# Patient Record
Sex: Female | Born: 1985
Health system: Southern US, Community
[De-identification: ages and names within clinical notes are randomized; demographics above are authoritative.]

## PROBLEM LIST (undated history)

## (undated) ENCOUNTER — Inpatient Hospital Stay: Payer: Self-pay

## (undated) DIAGNOSIS — F329 Major depressive disorder, single episode, unspecified: Secondary | ICD-10-CM

## (undated) DIAGNOSIS — F32A Depression, unspecified: Secondary | ICD-10-CM

## (undated) DIAGNOSIS — J45909 Unspecified asthma, uncomplicated: Secondary | ICD-10-CM

## (undated) DIAGNOSIS — F419 Anxiety disorder, unspecified: Secondary | ICD-10-CM

## (undated) DIAGNOSIS — F325 Major depressive disorder, single episode, in full remission: Secondary | ICD-10-CM

## (undated) DIAGNOSIS — E079 Disorder of thyroid, unspecified: Secondary | ICD-10-CM

## (undated) DIAGNOSIS — L709 Acne, unspecified: Secondary | ICD-10-CM

## (undated) HISTORY — DX: Acne, unspecified: L70.9

## (undated) HISTORY — PX: TOOTH EXTRACTION: SUR596

## (undated) HISTORY — DX: Disorder of thyroid, unspecified: E07.9

## (undated) HISTORY — PX: NO PAST SURGERIES: SHX2092

## (undated) HISTORY — DX: Anxiety disorder, unspecified: F41.9

---
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
## 2015-10-02 DIAGNOSIS — F32A Depression, unspecified: Secondary | ICD-10-CM | POA: Insufficient documentation

## 2015-10-02 DIAGNOSIS — F17200 Nicotine dependence, unspecified, uncomplicated: Secondary | ICD-10-CM | POA: Insufficient documentation

## 2015-10-02 DIAGNOSIS — B977 Papillomavirus as the cause of diseases classified elsewhere: Secondary | ICD-10-CM | POA: Insufficient documentation

## 2015-10-02 DIAGNOSIS — F329 Major depressive disorder, single episode, unspecified: Secondary | ICD-10-CM | POA: Insufficient documentation

## 2017-06-23 ENCOUNTER — Emergency Department
Admission: EM | Admit: 2017-06-23 | Discharge: 2017-06-23 | Disposition: A | Discharge status: Home / Self Care | Payer: Managed Care, Other (non HMO) | Attending: Emergency Medicine | Admitting: Emergency Medicine

## 2017-06-23 ENCOUNTER — Encounter: Payer: Self-pay | Admitting: *Deleted

## 2017-06-23 ENCOUNTER — Emergency Department: Payer: Managed Care, Other (non HMO)

## 2017-06-23 DIAGNOSIS — Z79899 Other long term (current) drug therapy: Secondary | ICD-10-CM | POA: Diagnosis not present

## 2017-06-23 DIAGNOSIS — J181 Lobar pneumonia, unspecified organism: Secondary | ICD-10-CM | POA: Insufficient documentation

## 2017-06-23 DIAGNOSIS — R0602 Shortness of breath: Secondary | ICD-10-CM

## 2017-06-23 DIAGNOSIS — J189 Pneumonia, unspecified organism: Secondary | ICD-10-CM

## 2017-06-23 DIAGNOSIS — F17210 Nicotine dependence, cigarettes, uncomplicated: Secondary | ICD-10-CM | POA: Diagnosis not present

## 2017-06-23 DIAGNOSIS — J45909 Unspecified asthma, uncomplicated: Secondary | ICD-10-CM | POA: Diagnosis not present

## 2017-06-23 HISTORY — DX: Unspecified asthma, uncomplicated: J45.909

## 2017-06-23 HISTORY — DX: Depression, unspecified: F32.A

## 2017-06-23 HISTORY — DX: Major depressive disorder, single episode, unspecified: F32.9

## 2017-06-23 LAB — CBC
HCT: 36.1 % (ref 35.0–47.0)
Hemoglobin: 12.5 g/dL (ref 12.0–16.0)
MCH: 29.2 pg (ref 26.0–34.0)
MCHC: 34.6 g/dL (ref 32.0–36.0)
MCV: 84.5 fL (ref 80.0–100.0)
PLATELETS: 354 10*3/uL (ref 150–440)
RBC: 4.27 MIL/uL (ref 3.80–5.20)
RDW: 13.5 % (ref 11.5–14.5)
WBC: 11 10*3/uL (ref 3.6–11.0)

## 2017-06-23 LAB — BASIC METABOLIC PANEL
Anion gap: 9 (ref 5–15)
BUN: 8 mg/dL (ref 6–20)
CALCIUM: 10.6 mg/dL — AB (ref 8.9–10.3)
CO2: 26 mmol/L (ref 22–32)
CREATININE: 1.04 mg/dL — AB (ref 0.44–1.00)
Chloride: 107 mmol/L (ref 101–111)
GFR calc Af Amer: >60 mL/min (ref >60)
Glucose, Bld: 163 mg/dL — ABNORMAL HIGH (ref 65–99)
Potassium: 3.4 mmol/L — ABNORMAL LOW (ref 3.5–5.1)
SODIUM: 142 mmol/L (ref 135–145)

## 2017-06-23 LAB — TROPONIN I

## 2017-06-23 MED ORDER — LORAZEPAM 1 MG PO TABS
1.0000 mg | ORAL_TABLET | Freq: Once | ORAL | Status: AC
  Administered 2017-06-23: 1 mg via ORAL
  Filled 2017-06-23: qty 1

## 2017-06-23 MED ORDER — ALBUTEROL SULFATE (2.5 MG/3ML) 0.083% IN NEBU
5.0000 mg | INHALATION_SOLUTION | Freq: Once | RESPIRATORY_TRACT | Status: AC
  Administered 2017-06-23: 5 mg via RESPIRATORY_TRACT
  Filled 2017-06-23: qty 6

## 2017-06-23 MED ORDER — IPRATROPIUM-ALBUTEROL 0.5-2.5 (3) MG/3ML IN SOLN
RESPIRATORY_TRACT | Status: AC
  Filled 2017-06-23: qty 3

## 2017-06-23 MED ORDER — AZITHROMYCIN 250 MG PO TABS: ORAL_TABLET | ORAL | 0 refills | Status: AC

## 2017-06-23 NOTE — ED Provider Notes (Signed)
[facility] Emergency Department Provider Note  Time seen: 7:10 AM  I have reviewed the triage vital signs and the nursing notes.   HISTORY  Chief Complaint Shortness of Breath    HPI Jean Foster is a 31 y.o. female With a past medical history of asthma, anxiety, depression, presents to the emergency department for difficulty breathing. According to the patient and nurse report the patient was using marijuana last night when she began feeling short of breath which she states was between 1-2:00 in the morning.patient states she continued to have shortness of breath throughout the night unable to go to sleep she began having some tightness sensation in her left arm as well which prompted the emergency department visit. Patient states a history of asthma, does not currently have an inhaler. She also states a history of anxiety. Denies any "chest pain." Denies any cough, congestion, fever. Patient currently sitting upright in bed but denies any trouble breathing but continues to feel a sensation of tightness in her chest.  Past Medical History:  Diagnosis Date  . Asthma   . Depression     There are no active problems to display for this patient.   History reviewed. No pertinent surgical history.  Prior to Admission medications   Medication Sig Start Date End Date Taking? Authorizing Provider  buPROPion (WELLBUTRIN XL) 300 MG 24 hr tablet Take 300 mg by mouth daily.   Yes [provider]    No Known Allergies  History reviewed. No pertinent family history.  Social History Social History  Substance Use Topics  . Smoking status: Current Some Day Smoker    Types: Cigarettes  . Smokeless tobacco: Never Used  . Alcohol use Yes    Review of Systems Constitutional: Negative for fever. Cardiovascular: positive for chest tightness Respiratory: positive for shortness of breath Gastrointestinal: Negative for abdominal pain Musculoskeletal:  Negative for back pain. Neurological: Negative for headache All other ROS negative  ____________________________________________   PHYSICAL EXAM:  VITAL SIGNS: ED Triage Vitals  Enc Vitals Group     BP 06/23/17 0641 129/88     Pulse Rate 06/23/17 0641 (!) 101     Resp 06/23/17 0641 (!) 22     Temp 06/23/17 0641 97.9 F (36.6 C)     Temp Source 06/23/17 0641 Oral     SpO2 06/23/17 0641 100 %     Weight 06/23/17 0641 155 lb (70.3 kg)     Height 06/23/17 0641 5\' 9"  (1.753 m)     Head Circumference --      Peak Flow --      Pain Score 06/23/17 0639 3     Pain Loc --      Pain Edu? --      Excl. in [place]? --     Constitutional: Alert and oriented. sitting upright in bed, taking deep breaths, mildly anxious appearing. Able to speak in complete sentences without difficulty however. Follows all commands. Eyes: Normal exam ENT   Head: Normocephalic and atraumatic.   Mouth/Throat: Mucous membranes are moist. Cardiovascular: Normal rate, regular rhythm. No murmur Respiratory: Normal respiratory effort without tachypnea nor retractions. Breath sounds are clear, with good air movement bilaterally. Gastrointestinal: Soft and nontender. No distention.   Musculoskeletal: Nontender with normal range of motion in all extremities.  no lower extremity edema  Neurologic:  Normal speech and language. No gross focal neurologic deficits  Skin:  Skin is warm, dry and intact.  Psychiatric: Mood and affect are normal.  ____________________________________________    EKG  EKG reviewed and interpreted by myself shows normal sinus rhythm at 97 bpm with a narrow QRS, normal axis, normal intervals, no concerning ST changes.  ____________________________________________    RADIOLOGY  IMPRESSION: Left basilar ill-defined round opacity. This may represent round pneumonia. Underlying nodule is not excluded. Followup PA and lateral chest X-ray is recommended in 3-4 weeks following trial  of antibiotic therapy to ensure resolution and exclude underlying malignancy.  ____________________________________________   INITIAL IMPRESSION / ASSESSMENT AND PLAN / ED COURSE  Pertinent labs & imaging results that were available during my care of the patient were reviewed by me and considered in my medical decision making (see chart for details).  patient presents to the emergency department for shortness of breath and tightness in her left arm. Differential this time would include ACS, asthma exacerbation, anxiety, other intrathoracic abnormality such as pneumothorax, pneumonia. We'll obtain a chest x-ray, check basic labs including a cardiac enzyme we will also treat with 1 mg of oral Ativan while awaiting results given the patient's history of anxiety. Overall patient appears extremely well, no distress, but somewhat anxious appearing. She has great air movement bilaterally without wheeze although the patient is already received a breathing treatment prior to my exam.  x-ray shows a round opacity possibly representing pneumonia. Given the patient's shortness of breath we will treat with antibiotics and have the patient follow-up with her doctor in 3 or 4 weeks for repeat x-ray to rule out other causes such as malignancy. I discussed this with the patient, she is agreeable to this plan.  patient's labs otherwise normal, normal white blood cell count. Troponin is negative. We will place the patient on Zithromax with PCP follow-up. Discussed return precautions we will also refill an albuterol inhaler for the patient.  ____________________________________________   FINAL CLINICAL IMPRESSION(S) / ED DIAGNOSES  shortness of breath pneumonia   Harvest Dark, MD 06/23/17 [phone]

## 2017-06-23 NOTE — Discharge Instructions (Signed)
as we have discussed please take your antibiotics as prescribed. Please follow-up with a primary care doctor within the next 3-4 weeks for recheck/reevaluation as well as to have a repeat chest x-ray performed to ensure that the pneumonia has resolved and to rule out any underlying masses.

## 2017-06-23 NOTE — ED Triage Notes (Signed)
Pt c/o shortness of breath since 0200 today. Pt admits to marijuana use at the time of onset. Pt admits to ETOH use last night. Pt has dry, non-productive cough at this time. Pt is pale and while is able to take deep breaths, states she feels she is still short of breath. Pt has small amount of wheezing in LLL. Pt c/o L arm tingling and L shoulderblade pain.

## 2017-09-16 NOTE — L&D Delivery Note (Signed)
Delivery Summary for [name]  Labor Events:   Preterm labor:   Rupture date:   Rupture time:   Rupture type: Intact  Fluid Color:   Induction:   Augmentation:   Complications:   Cervical ripening:          Delivery:   Episiotomy:   Lacerations:   Repair suture:   Repair # of packets:   Blood loss (ml): 50   Information for the patient's newborn:  Ardell, Aaronson [[id number]]    Delivery 06/29/2018 1:18 AM by  Vaginal, Spontaneous Sex:  female Gestational Age: [redacted]w[redacted]d Delivery Clinician:   Living?:         APGARS  One minute Five minutes Ten minutes  Skin color:        Heart rate:        Grimace:        Muscle tone:        Breathing:        Totals: 8  9      Presentation/position:      Resuscitation:   Cord information:    Disposition of cord blood:     Blood gases sent?  Complications:   Placenta: Delivered:       appearance Newborn Measurements: Weight: 6 lb 2.4 oz (2790 g)  Height: 19.49"  Head circumference:    Chest circumference:    Other providers:    Additional  information: Forceps:   Vacuum:   Breech:   Observed anomalies       Delivery Note At 1:18 AM a viable and healthy female was delivered after precipitous labor via  (Presentation: vertex; LOA position).  APGAR: 8, 9; weight 2790 grams.   Placenta status: spontaneously removed, intact.  Cord: 3-vessel, with the following complications: body cord x 1, reduced after delivery of fetus.  Cord pH: not obtained.   Anesthesia: None Episiotomy:  None Lacerations:  1st degree left vaginal, hemostatic Suture Repair: None Est. Blood Loss (mL):  50  Mom to postpartum.  Baby to Couplet care / Skin to Skin.  Rubie Maid 06/29/2018, 1:33 AM

## 2017-12-02 ENCOUNTER — Ambulatory Visit (INDEPENDENT_AMBULATORY_CARE_PROVIDER_SITE_OTHER): Payer: Managed Care, Other (non HMO) | Admitting: Obstetrics and Gynecology

## 2017-12-02 ENCOUNTER — Encounter: Payer: Self-pay | Admitting: Obstetrics and Gynecology

## 2017-12-02 VITALS — BP 118/71 | HR 101 | Ht 69.0 in | Wt 167.9 lb

## 2017-12-02 DIAGNOSIS — N912 Amenorrhea, unspecified: Secondary | ICD-10-CM

## 2017-12-02 LAB — POCT URINE PREGNANCY: PREG TEST UR: POSITIVE — AB

## 2017-12-02 NOTE — Progress Notes (Signed)
HPI:      [name] is a 32 y.o. G2P1001 who LMP was Patient's last menstrual period was 10/14/2017 (exact date).  Subjective:   She presents today with complaint of a missed menstrual.  She believes she is approximately [redacted] weeks pregnant.  She complains of some left-sided pelvic pain and has concerns regarding ectopic pregnancy.  This is despite the fact that she has no increased risk for ectopic pregnancy. She is using sea bands for nausea and these are working very well. She discontinued tobacco use last October. This was an unintended pregnancy. She is currently taking prenatal vitamins. Her last pregnancy was uncomplicated vaginal birth at term.    Hx: The following portions of the patient's history were reviewed and updated as appropriate:             She  has a past medical history of Acne, Asthma, and Depression. She does not have a problem list on file. She  has a past surgical history that includes No past surgeries. Her family history is not on file. She  reports that she quit smoking about 5 months ago. Her smoking use included cigarettes. she has never used smokeless tobacco. She reports that she does not drink alcohol or use drugs. She has a current medication list which includes the following prescription(s): albuterol, bupropion, and multivitamin-prenatal. She has No Known Allergies.       Review of Systems:  Review of Systems  Constitutional: Denied constitutional symptoms, night sweats, recent illness, fatigue, fever, insomnia and weight loss.  Eyes: Denied eye symptoms, eye pain, photophobia, vision change and visual disturbance.  Ears/Nose/Throat/Neck: Denied ear, nose, throat or neck symptoms, hearing loss, nasal discharge, sinus congestion and sore throat.  Cardiovascular: Denied cardiovascular symptoms, arrhythmia, chest pain/pressure, edema, exercise intolerance, orthopnea and palpitations.  Respiratory: Denied pulmonary symptoms, asthma, pleuritic pain,  productive sputum, cough, dyspnea and wheezing.  Gastrointestinal: Denied, gastro-esophageal reflux, melena, nausea and vomiting.  Genitourinary: Denied genitourinary symptoms including symptomatic vaginal discharge, pelvic relaxation issues, and urinary complaints.  Musculoskeletal: Denied musculoskeletal symptoms, stiffness, swelling, muscle weakness and myalgia.  Dermatologic: Denied dermatology symptoms, rash and scar.  Neurologic: Denied neurology symptoms, dizziness, headache, neck pain and syncope.  Psychiatric: Denied psychiatric symptoms, anxiety and depression.  Endocrine: Denied endocrine symptoms including hot flashes and night sweats.   Meds:   Current Outpatient Medications on File Prior to Visit  Medication Sig Dispense Refill  . albuterol (PROVENTIL HFA;VENTOLIN HFA) 108 (90 Base) MCG/ACT inhaler Inhale into the lungs every 6 (six) hours as needed for wheezing or shortness of breath.    Marland Kitchen buPROPion (WELLBUTRIN XL) 300 MG 24 hr tablet Take 300 mg by mouth daily.    . Prenatal Vit-Fe Fumarate-FA (MULTIVITAMIN-PRENATAL) 27-0.8 MG TABS tablet Take 1 tablet by mouth daily at 12 noon.     No current facility-administered medications on file prior to visit.     Objective:     Vitals:   12/02/17 1034  BP: 118/71  Pulse: (!) 101              Urinary beta-hCG positive.  Assessment:    G2P1001 There are no active problems to display for this patient.    1. Amenorrhea     Positive pregnancy test.  Left-sided pelvic pain. -Not disabling   Plan:            1.  Prenatal Plan 1.  The patient was given prenatal literature. 2.  She was continued on prenatal vitamins.  3.  A prenatal lab panel was ordered or drawn. 4.  An ultrasound was ordered to better determine an EDC. 5.  A nurse visit was scheduled. 6.  Genetic testing and testing for other inheritable conditions discussed in detail. She will decide in the future whether to have these labs performed. 7.  A  general overview of pregnancy testing, visit schedule, ultrasound schedule, and prenatal care was discussed.   Orders Orders Placed This Encounter  Procedures  . POCT urine pregnancy    No orders of the defined types were placed in this encounter.     F/U  Return in about 5 weeks (around 01/06/2018). I spent 31 minutes with this patient of which greater than 50% was spent discussing pelvic pain, pregnancy expectations and lab testing, ultrasound, genetic testing-see above.  Finis Bud, M.D. 12/02/2017 11:01 AM

## 2017-12-03 ENCOUNTER — Other Ambulatory Visit: Payer: Self-pay | Admitting: Obstetrics and Gynecology

## 2017-12-03 DIAGNOSIS — N926 Irregular menstruation, unspecified: Secondary | ICD-10-CM

## 2017-12-10 ENCOUNTER — Other Ambulatory Visit: Payer: Managed Care, Other (non HMO)

## 2017-12-10 ENCOUNTER — Ambulatory Visit (INDEPENDENT_AMBULATORY_CARE_PROVIDER_SITE_OTHER): Payer: Managed Care, Other (non HMO)

## 2017-12-10 DIAGNOSIS — N926 Irregular menstruation, unspecified: Secondary | ICD-10-CM

## 2017-12-22 ENCOUNTER — Ambulatory Visit (INDEPENDENT_AMBULATORY_CARE_PROVIDER_SITE_OTHER): Payer: Managed Care, Other (non HMO) | Admitting: Obstetrics and Gynecology

## 2017-12-22 VITALS — BP 105/59 | HR 81 | Ht 69.0 in | Wt 168.9 lb

## 2017-12-22 DIAGNOSIS — Z3492 Encounter for supervision of normal pregnancy, unspecified, second trimester: Secondary | ICD-10-CM

## 2017-12-22 NOTE — Progress Notes (Signed)
Desiraye Rolfson presents for [place] nurse interview visit. Pregnancy confirmation done here at [facility].  G- 2.  P-  1  . Pregnancy education material explained and given. _No__ cats in the home. NOB labs ordered. HIV labs and Drug screen were explained optional and she did not decline. Drug screen ordered. PNV encouraged. Genetic screening options discussed. Genetic testing: Ordered.  Pt may discuss with provider. Pt has follow up appointment with Dr. Amalia Hailey

## 2017-12-22 NOTE — Patient Instructions (Signed)
First Trimester of Pregnancy The first trimester of pregnancy is from week 1 until the end of week 13 (months 1 through 3). During this time, your baby will begin to develop inside you. At 6-8 weeks, the eyes and face are formed, and the heartbeat can be seen on ultrasound. At the end of 12 weeks, all the baby's organs are formed. Prenatal care is all the medical care you receive before the birth of your baby. Make sure you get good prenatal care and follow all of your doctor's instructions. Follow these instructions at home: Medicines  Take over-the-counter and prescription medicines only as told by your doctor. Some medicines are safe and some medicines are not safe during pregnancy.  Take a prenatal vitamin that contains at least 600 micrograms (mcg) of folic acid.  If you have trouble pooping (constipation), take medicine that will make your stool soft (stool softener) if your doctor approves. Eating and drinking  Eat regular, healthy meals.  Your doctor will tell you the amount of weight gain that is right for you.  Avoid raw meat and uncooked cheese.  If you feel sick to your stomach (nauseous) or throw up (vomit): ? Eat 4 or 5 small meals a day instead of 3 large meals. ? Try eating a few soda crackers. ? Drink liquids between meals instead of during meals.  To prevent constipation: ? Eat foods that are high in fiber, like fresh fruits and vegetables, whole grains, and beans. ? Drink enough fluids to keep your pee (urine) clear or pale yellow. Activity  Exercise only as told by your doctor. Stop exercising if you have cramps or pain in your lower belly (abdomen) or low back.  Do not exercise if it is too hot, too humid, or if you are in a place of great height (high altitude).  Try to avoid standing for long periods of time. Move your legs often if you must stand in one place for a long time.  Avoid heavy lifting.  Wear low-heeled shoes. Sit and stand up straight.  You  can have sex unless your doctor tells you not to. Relieving pain and discomfort  Wear a good support bra if your breasts are sore.  Take warm water baths (sitz baths) to soothe pain or discomfort caused by hemorrhoids. Use hemorrhoid cream if your doctor says it is okay.  Rest with your legs raised if you have leg cramps or low back pain.  If you have puffy, bulging veins (varicose veins) in your legs: ? Wear support hose or compression stockings as told by your doctor. ? Raise (elevate) your feet for 15 minutes, 3-4 times a day. ? Limit salt in your food. Prenatal care  Schedule your prenatal visits by the twelfth week of pregnancy.  Write down your questions. Take them to your prenatal visits.  Keep all your prenatal visits as told by your doctor. This is important. Safety  Wear your seat belt at all times when driving.  Make a list of emergency phone numbers. The list should include numbers for family, friends, the hospital, and police and fire departments. General instructions  Ask your doctor for a referral to a local prenatal class. Begin classes no later than at the start of month 6 of your pregnancy.  Ask for help if you need counseling or if you need help with nutrition. Your doctor can give you advice or tell you where to go for help.  Do not use hot tubs, steam rooms, or   saunas.  Do not douche or use tampons or scented sanitary pads.  Do not cross your legs for long periods of time.  Avoid all herbs and alcohol. Avoid drugs that are not approved by your doctor.  Do not use any tobacco products, including cigarettes, chewing tobacco, and electronic cigarettes. If you need help quitting, ask your doctor. You may get counseling or other support to help you quit.  Avoid cat litter boxes and soil used by cats. These carry germs that can cause birth defects in the baby and can cause a loss of your baby (miscarriage) or stillbirth.  Visit your dentist. At home, brush  your teeth with a soft toothbrush. Be gentle when you floss. Contact a doctor if:  You are dizzy.  You have mild cramps or pressure in your lower belly.  You have a nagging pain in your belly area.  You continue to feel sick to your stomach, you throw up, or you have watery poop (diarrhea).  You have a bad smelling fluid coming from your vagina.  You have pain when you pee (urinate).  You have increased puffiness (swelling) in your face, hands, legs, or ankles. Get help right away if:  You have a fever.  You are leaking fluid from your vagina.  You have spotting or bleeding from your vagina.  You have very bad belly cramping or pain.  You gain or lose weight rapidly.  You throw up blood. It may look like coffee grounds.  You are around people who have German measles, fifth disease, or chickenpox.  You have a very bad headache.  You have shortness of breath.  You have any kind of trauma, such as from a fall or a car accident. Summary  The first trimester of pregnancy is from week 1 until the end of week 13 (months 1 through 3).  To take care of yourself and your unborn baby, you will need to eat healthy meals, take medicines only if your doctor tells you to do so, and do activities that are safe for you and your baby.  Keep all follow-up visits as told by your doctor. This is important as your doctor will have to ensure that your baby is healthy and growing well. This information is not intended to replace advice given to you by your health care provider. Make sure you discuss any questions you have with your health care provider. Document Released: 02/19/2008 Document Revised: 09/10/2016 Document Reviewed: 09/10/2016 Elsevier Interactive Patient Education  2017 [employer]vier Inc.  

## 2017-12-23 LAB — CBC WITH DIFFERENTIAL/PLATELET
BASOS ABS: 0 10*3/uL (ref 0.0–0.2)
BASOS: 0 %
EOS (ABSOLUTE): 0 10*3/uL (ref 0.0–0.4)
Eos: 0 %
Hematocrit: 36.8 % (ref 34.0–46.6)
Hemoglobin: 12.5 g/dL (ref 11.1–15.9)
IMMATURE GRANS (ABS): 0 10*3/uL (ref 0.0–0.1)
Immature Granulocytes: 0 %
LYMPHS: 26 %
Lymphocytes Absolute: 1.9 10*3/uL (ref 0.7–3.1)
MCH: 28.5 pg (ref 26.6–33.0)
MCHC: 34 g/dL (ref 31.5–35.7)
MCV: 84 fL (ref 79–97)
MONOS ABS: 0.7 10*3/uL (ref 0.1–0.9)
Monocytes: 10 %
NEUTROS PCT: 64 %
Neutrophils Absolute: 4.8 10*3/uL (ref 1.4–7.0)
Platelets: 293 10*3/uL (ref 150–379)
RBC: 4.38 x10E6/uL (ref 3.77–5.28)
RDW: 14 % (ref 12.3–15.4)
WBC: 7.5 10*3/uL (ref 3.4–10.8)

## 2017-12-23 LAB — MICROSCOPIC EXAMINATION
CASTS: NONE SEEN /LPF
Epithelial Cells (non renal): >10 /hpf — AB (ref 0–10)

## 2017-12-23 LAB — URINALYSIS, ROUTINE W REFLEX MICROSCOPIC
Bilirubin, UA: NEGATIVE
GLUCOSE, UA: NEGATIVE
KETONES UA: NEGATIVE
NITRITE UA: NEGATIVE
Protein, UA: NEGATIVE
RBC, UA: NEGATIVE
SPEC GRAV UA: 1.026 (ref 1.005–1.030)
Urobilinogen, Ur: 0.2 mg/dL (ref 0.2–1.0)
pH, UA: 5.5 (ref 5.0–7.5)

## 2017-12-23 LAB — HEPATITIS B SURFACE ANTIGEN: HEP B S AG: NEGATIVE

## 2017-12-23 LAB — ABO AND RH: RH TYPE: POSITIVE

## 2017-12-23 LAB — HIV ANTIBODY (ROUTINE TESTING W REFLEX): HIV SCREEN 4TH GENERATION: NONREACTIVE

## 2017-12-23 LAB — ANTIBODY SCREEN: Antibody Screen: NEGATIVE

## 2017-12-23 LAB — RUBELLA SCREEN: RUBELLA: 1.31 {index} (ref >0.99)

## 2017-12-23 LAB — VARICELLA ZOSTER ANTIBODY, IGG: Varicella zoster IgG: 405 index (ref >165)

## 2017-12-24 LAB — GC/CHLAMYDIA PROBE AMP
Chlamydia trachomatis, NAA: NEGATIVE
NEISSERIA GONORRHOEAE BY PCR: NEGATIVE

## 2017-12-24 LAB — URINE CULTURE

## 2017-12-25 LAB — MONITOR DRUG PROFILE 14(MW)
Amphetamine Scrn, Ur: NEGATIVE ng/mL
BARBITURATE SCREEN URINE: NEGATIVE ng/mL
BENZODIAZEPINE SCREEN, URINE: NEGATIVE ng/mL
Buprenorphine, Urine: NEGATIVE ng/mL
Cocaine (Metab) Scrn, Ur: NEGATIVE ng/mL
Creatinine(Crt), U: 232.7 mg/dL (ref 20.0–300.0)
Fentanyl, Urine: NEGATIVE pg/mL
Meperidine Screen, Urine: NEGATIVE ng/mL
Methadone Screen, Urine: NEGATIVE ng/mL
OXYCODONE+OXYMORPHONE UR QL SCN: NEGATIVE ng/mL
Opiate Scrn, Ur: NEGATIVE ng/mL
Ph of Urine: 5.6 (ref 4.5–8.9)
Phencyclidine Qn, Ur: NEGATIVE ng/mL
Propoxyphene Scrn, Ur: NEGATIVE ng/mL
SPECIFIC GRAVITY: 1.025
Tramadol Screen, Urine: NEGATIVE ng/mL

## 2017-12-25 LAB — CANNABINOID (GC/MS), URINE
Cannabinoid: POSITIVE — AB
Carboxy THC (GC/MS): 185 ng/mL

## 2018-01-06 ENCOUNTER — Ambulatory Visit (INDEPENDENT_AMBULATORY_CARE_PROVIDER_SITE_OTHER): Payer: Managed Care, Other (non HMO) | Admitting: Obstetrics and Gynecology

## 2018-01-06 ENCOUNTER — Encounter: Payer: Self-pay | Admitting: Obstetrics and Gynecology

## 2018-01-06 VITALS — BP 97/67 | HR 97 | Wt 170.8 lb

## 2018-01-06 DIAGNOSIS — Z3492 Encounter for supervision of normal pregnancy, unspecified, second trimester: Secondary | ICD-10-CM | POA: Diagnosis not present

## 2018-01-06 LAB — POCT URINALYSIS DIPSTICK
Bilirubin, UA: NEGATIVE
Blood, UA: NEGATIVE
GLUCOSE UA: NEGATIVE
Ketones, UA: NEGATIVE
LEUKOCYTES UA: NEGATIVE
NITRITE UA: NEGATIVE
ODOR: NEGATIVE
Spec Grav, UA: >=1.03 — AB (ref 1.010–1.025)
Urobilinogen, UA: 0.2 E.U./dL
pH, UA: 6 (ref 5.0–8.0)

## 2018-01-06 NOTE — Progress Notes (Signed)
Nob PE. Last pap 09/2016 wnl.  Declines genetic testing.

## 2018-01-06 NOTE — Progress Notes (Signed)
NOB: Patient without complaint.  Declines genetic testing but wants quad screen at next visit.  Physical examination General NAD, Conversant  HEENT Atraumatic; Op clear with mmm.  Normo-cephalic. Pupils reactive. Anicteric sclerae  Thyroid/Neck Smooth without nodularity or enlargement. Normal ROM.  Neck Supple.  Skin No rashes, lesions or ulceration. Normal palpated skin turgor. No nodularity.  Breasts: No masses or discharge.  Symmetric.  No axillary adenopathy.  Lungs: Clear to auscultation.No rales or wheezes. Normal Respiratory effort, no retractions.  Heart: NSR.  No murmurs or rubs appreciated. No periferal edema  Abdomen: Soft.  Non-tender.  No masses.  No HSM. No hernia  Extremities: Moves all appropriately.  Normal ROM for age. No lymphadenopathy.  Neuro: Oriented to PPT.  Normal mood. Normal affect.     Pelvic:   Vulva: Normal appearance.  No lesions.  Vagina: No lesions or abnormalities noted.  Support: Normal pelvic support.  Urethra No masses tenderness or scarring.  Meatus Normal size without lesions or prolapse.  Cervix: Normal appearance.  No lesions.  Anus: Normal exam.  No lesions.  Perineum: Normal exam.  No lesions.        Bimanual   Adnexae: No masses.  Non-tender to palpation.  Uterus: Enlarged. 12 wks  Non-tender.  Mobile.  AV.  Positive FHTs  Adnexae: No masses.  Non-tender to palpation.  Cul-de-sac: Negative for abnormality.  Adnexae: No masses.  Non-tender to palpation.         Pelvimetry   Diagonal: Reached.  Spines: Average.  Sacrum: Concave.  Pubic Arch: Normal.   Plan: Stressed importance of Pap postpartum.            Quad screen next visit

## 2018-02-05 ENCOUNTER — Ambulatory Visit (INDEPENDENT_AMBULATORY_CARE_PROVIDER_SITE_OTHER): Payer: Managed Care, Other (non HMO) | Admitting: Obstetrics and Gynecology

## 2018-02-05 VITALS — BP 117/71 | HR 82 | Wt 176.7 lb

## 2018-02-05 DIAGNOSIS — Z3482 Encounter for supervision of other normal pregnancy, second trimester: Secondary | ICD-10-CM

## 2018-02-05 DIAGNOSIS — Z1379 Encounter for other screening for genetic and chromosomal anomalies: Secondary | ICD-10-CM

## 2018-02-05 LAB — POCT URINALYSIS DIPSTICK
Bilirubin, UA: NEGATIVE
Blood, UA: NEGATIVE
Glucose, UA: NEGATIVE
KETONES UA: NEGATIVE
LEUKOCYTES UA: NEGATIVE
Nitrite, UA: NEGATIVE
PROTEIN UA: NEGATIVE
SPEC GRAV UA: 1.01 (ref 1.010–1.025)
UROBILINOGEN UA: 0.2 U/dL
pH, UA: 7.5 (ref 5.0–8.0)

## 2018-02-05 NOTE — Patient Instructions (Signed)
Second Trimester of Pregnancy The second trimester is from week 13 through week 28, month 4 through 6. This is often the time in pregnancy that you feel your best. Often times, morning sickness has lessened or quit. You may have more energy, and you may get hungry more often. Your unborn baby (fetus) is growing rapidly. At the end of the sixth month, he or she is about 9 inches long and weighs about 1 pounds. You will likely feel the baby move (quickening) between 18 and 20 weeks of pregnancy. Follow these instructions at home:  Avoid all smoking, herbs, and alcohol. Avoid drugs not approved by your doctor.  Do not use any tobacco products, including cigarettes, chewing tobacco, and electronic cigarettes. If you need help quitting, ask your doctor. You may get counseling or other support to help you quit.  Only take medicine as told by your doctor. Some medicines are safe and some are not during pregnancy.  Exercise only as told by your doctor. Stop exercising if you start having cramps.  Eat regular, healthy meals.  Wear a good support bra if your breasts are tender.  Do not use hot tubs, steam rooms, or saunas.  Wear your seat belt when driving.  Avoid raw meat, uncooked cheese, and liter boxes and soil used by cats.  Take your prenatal vitamins.  Take 1500-2000 milligrams of calcium daily starting at the 20th week of pregnancy until you deliver your baby.  Try taking medicine that helps you poop (stool softener) as needed, and if your doctor approves. Eat more fiber by eating fresh fruit, vegetables, and whole grains. Drink enough fluids to keep your pee (urine) clear or pale yellow.  Take warm water baths (sitz baths) to soothe pain or discomfort caused by hemorrhoids. Use hemorrhoid cream if your doctor approves.  If you have puffy, bulging veins (varicose veins), wear support hose. Raise (elevate) your feet for 15 minutes, 3-4 times a day. Limit salt in your diet.  Avoid heavy  lifting, wear low heals, and sit up straight.  Rest with your legs raised if you have leg cramps or low back pain.  Visit your dentist if you have not gone during your pregnancy. Use a soft toothbrush to brush your teeth. Be gentle when you floss.  You can have sex (intercourse) unless your doctor tells you not to.  Go to your doctor visits. Get help if:  You feel dizzy.  You have mild cramps or pressure in your lower belly (abdomen).  You have a nagging pain in your belly area.  You continue to feel sick to your stomach (nauseous), throw up (vomit), or have watery poop (diarrhea).  You have bad smelling fluid coming from your vagina.  You have pain with peeing (urination). Get help right away if:  You have a fever.  You are leaking fluid from your vagina.  You have spotting or bleeding from your vagina.  You have severe belly cramping or pain.  You lose or gain weight rapidly.  You have trouble catching your breath and have chest pain.  You notice sudden or extreme puffiness (swelling) of your face, hands, ankles, feet, or legs.  You have not felt the baby move in over an hour.  You have severe headaches that do not go away with medicine.  You have vision changes. This information is not intended to replace advice given to you by your health care provider. Make sure you discuss any questions you have with your health care   provider. Document Released: 11/27/2009 Document Revised: 02/08/2016 Document Reviewed: 11/03/2012 Elsevier Interactive Patient Education  2017 Elsevier Inc.  

## 2018-02-05 NOTE — Progress Notes (Signed)
ROB-pt stated that she is doing well and have no problems or concerns to address today.

## 2018-02-05 NOTE — Progress Notes (Signed)
ROB: Patient doing well, no complaints. For quad screen today. RTC in 4 weeks, for anatomy scan then.

## 2018-02-07 LAB — AFP TETRA
DIA MOM VALUE: 0.64
DIA VALUE (EIA): 100.3 pg/mL
DSR (BY AGE) 1 IN: 518
DSR (Second Trimester) 1 IN: 10000
Gestational Age: 16.9 WEEKS
MATERNAL AGE AT EDD: 32.1 a
MSAFP Mom: 0.76
MSAFP: 24.9 ng/mL
MSHCG MOM: 0.64
MSHCG: 20269 m[IU]/mL
Osb Risk: 10000
T18 (By Age): 1:2020 {titer}
Test Results:: NEGATIVE
UE3 VALUE: 0.98 ng/mL
WEIGHT: 176 [lb_av]
uE3 Mom: 1.01

## 2018-02-22 ENCOUNTER — Encounter: Payer: Self-pay | Admitting: Obstetrics and Gynecology

## 2018-02-22 DIAGNOSIS — F1290 Cannabis use, unspecified, uncomplicated: Secondary | ICD-10-CM | POA: Insufficient documentation

## 2018-03-04 ENCOUNTER — Ambulatory Visit (INDEPENDENT_AMBULATORY_CARE_PROVIDER_SITE_OTHER): Payer: Managed Care, Other (non HMO)

## 2018-03-04 ENCOUNTER — Ambulatory Visit (INDEPENDENT_AMBULATORY_CARE_PROVIDER_SITE_OTHER): Payer: Managed Care, Other (non HMO) | Admitting: Obstetrics and Gynecology

## 2018-03-04 ENCOUNTER — Encounter: Payer: Self-pay | Admitting: Obstetrics and Gynecology

## 2018-03-04 VITALS — BP 108/74 | HR 101 | Wt 182.8 lb

## 2018-03-04 DIAGNOSIS — Z3482 Encounter for supervision of other normal pregnancy, second trimester: Secondary | ICD-10-CM

## 2018-03-04 LAB — POCT URINALYSIS DIPSTICK
Bilirubin, UA: NEGATIVE
Blood, UA: NEGATIVE
GLUCOSE UA: NEGATIVE
Ketones, UA: NEGATIVE
Leukocytes, UA: NEGATIVE
NITRITE UA: NEGATIVE
PROTEIN UA: NEGATIVE
SPEC GRAV UA: 1.01 (ref 1.010–1.025)
Urobilinogen, UA: 0.2 E.U./dL
pH, UA: 6.5 (ref 5.0–8.0)

## 2018-03-04 NOTE — Progress Notes (Signed)
ROB: Heartburn somewhat improved.  Using over-the-counter meds.  Reports daily fetal movement.  No other complaints.

## 2018-03-04 NOTE — Progress Notes (Signed)
ROB-Pt is doing well no concerns.

## 2018-03-31 ENCOUNTER — Ambulatory Visit (INDEPENDENT_AMBULATORY_CARE_PROVIDER_SITE_OTHER): Payer: Managed Care, Other (non HMO) | Admitting: Obstetrics and Gynecology

## 2018-03-31 VITALS — BP 109/65 | HR 93 | Wt 187.4 lb

## 2018-03-31 DIAGNOSIS — O99012 Anemia complicating pregnancy, second trimester: Secondary | ICD-10-CM

## 2018-03-31 DIAGNOSIS — Z3482 Encounter for supervision of other normal pregnancy, second trimester: Secondary | ICD-10-CM

## 2018-03-31 DIAGNOSIS — Z3A23 23 weeks gestation of pregnancy: Secondary | ICD-10-CM | POA: Diagnosis not present

## 2018-03-31 DIAGNOSIS — Z131 Encounter for screening for diabetes mellitus: Secondary | ICD-10-CM

## 2018-03-31 LAB — POCT URINALYSIS DIPSTICK
BILIRUBIN UA: NEGATIVE
Glucose, UA: NEGATIVE
Ketones, UA: NEGATIVE
NITRITE UA: NEGATIVE
PH UA: 6.5 (ref 5.0–8.0)
PROTEIN UA: POSITIVE — AB
RBC UA: NEGATIVE
Spec Grav, UA: 1.02 (ref 1.010–1.025)
UROBILINOGEN UA: 0.2 U/dL

## 2018-03-31 LAB — POCT HEMOGLOBIN: Hemoglobin: 7.6 g/dL — AB (ref 12.2–16.2)

## 2018-03-31 MED ORDER — FERRALET 90 90-1 MG PO TABS: 1.0000 | ORAL_TABLET | Freq: Every day | ORAL | 3 refills | Status: DC

## 2018-03-31 NOTE — Patient Instructions (Signed)
Iron-Rich Diet Iron is a mineral that helps your body to produce hemoglobin. Hemoglobin is a protein in your red blood cells that carries oxygen to your body's tissues. Eating too little iron may cause you to feel weak and tired, and it can increase your risk for infection. Eating enough iron is necessary for your body's metabolism, muscle function, and nervous system. Iron is naturally found in many foods. It can also be added to foods or fortified in foods. There are two types of dietary iron:  Heme iron. Heme iron is absorbed by the body more easily than nonheme iron. Heme iron is found in meat, poultry, and fish.  Nonheme iron. Nonheme iron is found in dietary supplements, iron-fortified grains, beans, and vegetables.  You may need to follow an iron-rich diet if:  You have been diagnosed with iron deficiency or iron-deficiency anemia.  You have a condition that prevents you from absorbing dietary iron, such as: ? Infection in your intestines. ? Celiac disease. This involves long-lasting (chronic) inflammation of your intestines.  You do not eat enough iron.  You eat a diet that is high in foods that impair iron absorption.  You have lost a lot of blood.  You have heavy bleeding during your menstrual cycle.  You are pregnant.  What is my plan? Your health care provider may help you to determine how much iron you need per day based on your condition. Generally, when a person consumes sufficient amounts of iron in the diet, the following iron needs are met:  Men. ? 14-18 years old: 11 mg per day. ? 19-50 years old: 8 mg per day.  Women. ? 14-18 years old: 15 mg per day. ? 19-50 years old: 18 mg per day. ? Over 50 years old: 8 mg per day. ? Pregnant women: 27 mg per day. ? Breastfeeding women: 9 mg per day.  What do I need to know about an iron-rich diet?  Eat fresh fruits and vegetables that are high in vitamin C along with foods that are high in iron. This will help  increase the amount of iron that your body absorbs from food, especially with foods containing nonheme iron. Foods that are high in vitamin C include oranges, peppers, tomatoes, and mango.  Take iron supplements only as directed by your health care provider. Overdose of iron can be life-threatening. If you were prescribed iron supplements, take them with orange juice or a vitamin C supplement.  Cook foods in pots and pans that are made from iron.  Eat nonheme iron-containing foods alongside foods that are high in heme iron. This helps to improve your iron absorption.  Certain foods and drinks contain compounds that impair iron absorption. Avoid eating these foods in the same meal as iron-rich foods or with iron supplements. These include: ? Coffee, black tea, and red wine. ? Milk, dairy products, and foods that are high in calcium. ? Beans, soybeans, and peas. ? Whole grains.  When eating foods that contain both nonheme iron and compounds that impair iron absorption, follow these tips to absorb iron better. ? Soak beans overnight before cooking. ? Soak whole grains overnight and drain them before using. ? Ferment flours before baking, such as using yeast in bread dough. What foods can I eat? Grains Iron-fortified breakfast cereal. Iron-fortified whole-wheat bread. Enriched rice. Sprouted grains. Vegetables Spinach. Potatoes with skin. Green peas. Broccoli. Red and green bell peppers. Fermented vegetables. Fruits Prunes. Raisins. Oranges. Strawberries. Mango. Grapefruit. Meats and Other Protein Sources   Beef liver. Oysters. Beef. Shrimp. [place]. Chicken. [place]. Sardines. Chickpeas. Nuts. Tofu. Beverages Tomato juice. Fresh orange juice. Prune juice. Hibiscus tea. Fortified instant breakfast shakes. Condiments Tahini. Fermented soy sauce. Sweets and Desserts Black-strap molasses. Other Wheat germ. The items listed above may not be a complete list of recommended foods or beverages.  Contact your dietitian for more options. What foods are not recommended? Grains Whole grains. Bran cereal. Bran flour. Oats. Vegetables Artichokes. Brussels sprouts. Kale. Fruits Blueberries. Raspberries. Strawberries. Figs. Meats and Other Protein Sources Soybeans. Products made from soy protein. Dairy Milk. Cream. Cheese. Yogurt. Cottage cheese. Beverages Coffee. Black tea. Red wine. Sweets and Desserts Cocoa. Chocolate. Ice cream. Other Basil. Oregano. Parsley. The items listed above may not be a complete list of foods and beverages to avoid. Contact your dietitian for more information. This information is not intended to replace advice given to you by your health care provider. Make sure you discuss any questions you have with your health care provider. Document Released: 04/16/2005 Document Revised: 03/22/2016 Document Reviewed: 03/30/2014 Elsevier Interactive Patient Education  [employer].

## 2018-03-31 NOTE — Progress Notes (Signed)
ROB: Patient complains of feeling tired. Desired Hgb check.  POCT Hgb check reveals Hgb ~ 7 (previously 12.5 on NOB visit). Will order full CBC. Started on PO iron tablets and encouraged to increase dietary iron. Will check again in 4 weeks with 28 week labs.

## 2018-03-31 NOTE — Progress Notes (Signed)
ROB-pt stated that she was tired a lot and wanted to know if she could get her iron levels checked.

## 2018-04-01 LAB — CBC
HEMATOCRIT: 29 % — AB (ref 34.0–46.6)
HEMOGLOBIN: 10.1 g/dL — AB (ref 11.1–15.9)
MCH: 28.5 pg (ref 26.6–33.0)
MCHC: 34.8 g/dL (ref 31.5–35.7)
MCV: 82 fL (ref 79–97)
Platelets: 316 10*3/uL (ref 150–450)
RBC: 3.55 x10E6/uL — ABNORMAL LOW (ref 3.77–5.28)
RDW: 13.4 % (ref 12.3–15.4)
WBC: 9.3 10*3/uL (ref 3.4–10.8)

## 2018-04-28 ENCOUNTER — Other Ambulatory Visit: Payer: Managed Care, Other (non HMO)

## 2018-04-28 ENCOUNTER — Ambulatory Visit (INDEPENDENT_AMBULATORY_CARE_PROVIDER_SITE_OTHER): Payer: Managed Care, Other (non HMO) | Admitting: Obstetrics and Gynecology

## 2018-04-28 ENCOUNTER — Encounter: Payer: Self-pay | Admitting: Obstetrics and Gynecology

## 2018-04-28 VITALS — BP 102/62 | HR 80 | Wt 191.0 lb

## 2018-04-28 DIAGNOSIS — O99012 Anemia complicating pregnancy, second trimester: Secondary | ICD-10-CM

## 2018-04-28 DIAGNOSIS — Z3482 Encounter for supervision of other normal pregnancy, second trimester: Secondary | ICD-10-CM

## 2018-04-28 DIAGNOSIS — Z131 Encounter for screening for diabetes mellitus: Secondary | ICD-10-CM

## 2018-04-28 LAB — POCT URINALYSIS DIPSTICK
BILIRUBIN UA: NEGATIVE
GLUCOSE UA: NEGATIVE
Ketones, UA: NEGATIVE
Leukocytes, UA: NEGATIVE
Nitrite, UA: NEGATIVE
Protein, UA: NEGATIVE
RBC UA: NEGATIVE
SPEC GRAV UA: 1.015 (ref 1.010–1.025)
Urobilinogen, UA: 0.2 E.U./dL
pH, UA: 7 (ref 5.0–8.0)

## 2018-04-28 MED ORDER — TETANUS-DIPHTH-ACELL PERTUSSIS 5-2.5-18.5 LF-MCG/0.5 IM SUSP
0.5000 mL | Freq: Once | INTRAMUSCULAR | Status: AC
  Administered 2018-04-28: 0.5 mL via INTRAMUSCULAR

## 2018-04-28 NOTE — Progress Notes (Signed)
Pt states no concerns at this time. Would like to discuss birth control options.

## 2018-04-28 NOTE — Progress Notes (Signed)
ROB: No complaints.  Doing 1 hour GCT today.  Considering Mirena for birth control.

## 2018-04-29 LAB — CBC
HEMATOCRIT: 31.4 % — AB (ref 34.0–46.6)
HEMOGLOBIN: 10.1 g/dL — AB (ref 11.1–15.9)
MCH: 26.4 pg — ABNORMAL LOW (ref 26.6–33.0)
MCHC: 32.2 g/dL (ref 31.5–35.7)
MCV: 82 fL (ref 79–97)
Platelets: 298 10*3/uL (ref 150–450)
RBC: 3.83 x10E6/uL (ref 3.77–5.28)
RDW: 13.6 % (ref 12.3–15.4)
WBC: 8.4 10*3/uL (ref 3.4–10.8)

## 2018-04-29 LAB — RPR: RPR Ser Ql: NONREACTIVE

## 2018-04-29 LAB — GLUCOSE, 1 HOUR GESTATIONAL: Gestational Diabetes Screen: 136 mg/dL (ref 65–139)

## 2018-05-11 ENCOUNTER — Other Ambulatory Visit: Payer: Self-pay

## 2018-05-11 NOTE — Telephone Encounter (Signed)
Pt was called no answer and was unable to leave a message due to voicemail being full. Calling to inform pt that her FMLA paperwork were completed and faxed.

## 2018-05-12 ENCOUNTER — Other Ambulatory Visit: Payer: Self-pay

## 2018-05-12 NOTE — Telephone Encounter (Signed)
Pt was called no answer and was unable to leave voicemail due to the voicemail not been set up. Was calling pt to inform her that her [employer] paper work was completed and had been faxed in to the insurance company.

## 2018-05-19 ENCOUNTER — Ambulatory Visit (INDEPENDENT_AMBULATORY_CARE_PROVIDER_SITE_OTHER): Payer: Managed Care, Other (non HMO) | Admitting: Obstetrics and Gynecology

## 2018-05-19 VITALS — BP 97/58 | HR 98 | Wt 193.0 lb

## 2018-05-19 DIAGNOSIS — O26893 Other specified pregnancy related conditions, third trimester: Secondary | ICD-10-CM

## 2018-05-19 DIAGNOSIS — Z87898 Personal history of other specified conditions: Secondary | ICD-10-CM

## 2018-05-19 DIAGNOSIS — F1291 Cannabis use, unspecified, in remission: Secondary | ICD-10-CM

## 2018-05-19 DIAGNOSIS — Z3483 Encounter for supervision of other normal pregnancy, third trimester: Secondary | ICD-10-CM

## 2018-05-19 DIAGNOSIS — Z23 Encounter for immunization: Secondary | ICD-10-CM | POA: Diagnosis not present

## 2018-05-19 DIAGNOSIS — R12 Heartburn: Secondary | ICD-10-CM

## 2018-05-19 MED ORDER — PANTOPRAZOLE SODIUM 20 MG PO TBEC: 20.0000 mg | DELAYED_RELEASE_TABLET | Freq: Two times a day (BID) | ORAL | 3 refills | Status: DC

## 2018-05-19 NOTE — Progress Notes (Signed)
ROB- PT stated that she is doing well. No complaints. Pt received flu vaccine today.

## 2018-05-19 NOTE — Progress Notes (Signed)
ROB: Doing well. Noting heartburn not relieved by Tums and Zantac.  Prescribed Protonix.  No longer desires Mirena for IUD, now desires ParaGard IUD. Will breastfeed. Discussed circumcision for female infant, desired. Discussed establishing with Pediatrician. Flu vaccine given. RTC in 2 weeks.

## 2018-05-19 NOTE — Addendum Note (Signed)
Addended by: Edwyna Shell on: 05/19/2018 12:18 PM   Modules accepted: Orders

## 2018-05-22 ENCOUNTER — Telehealth: Payer: Self-pay | Admitting: Obstetrics and Gynecology

## 2018-05-22 MED ORDER — PANTOPRAZOLE SODIUM 40 MG PO TBEC: 40.0000 mg | DELAYED_RELEASE_TABLET | Freq: Every day | ORAL | 3 refills | Status: DC

## 2018-05-22 NOTE — Telephone Encounter (Signed)
The patient called and stated that she would lie to speak with her nurse in regards to her needing prior authorization for a medication that her insurance will not completely cover. Please advise.

## 2018-05-22 NOTE — Telephone Encounter (Signed)
Pt was called and stated that her insurance would not cover protonix 20 mg to be given bid. Redone order for protonix 40 mg once daily and sent to pharmacy.

## 2018-05-24 LAB — DRUG PROFILE, UR, 9 DRUGS (LABCORP)
AMPHETAMINES, URINE: NEGATIVE ng/mL
BARBITURATE QUANT UR: NEGATIVE ng/mL
BENZODIAZEPINE QUANT UR: NEGATIVE ng/mL
Cocaine (Metab.): NEGATIVE ng/mL
METHADONE SCREEN, URINE: NEGATIVE ng/mL
OPIATE QUANT UR: NEGATIVE ng/mL
PCP QUANT UR: NEGATIVE ng/mL
Propoxyphene: NEGATIVE ng/mL

## 2018-06-08 NOTE — Progress Notes (Signed)
Pt presents today for ROB. Pot states she is having trouble sleeping, otherwise no concerns.

## 2018-06-09 ENCOUNTER — Encounter: Payer: Self-pay | Admitting: Obstetrics and Gynecology

## 2018-06-09 ENCOUNTER — Ambulatory Visit (INDEPENDENT_AMBULATORY_CARE_PROVIDER_SITE_OTHER): Payer: Managed Care, Other (non HMO) | Admitting: Obstetrics and Gynecology

## 2018-06-09 VITALS — BP 124/71 | HR 83 | Wt 200.0 lb

## 2018-06-09 DIAGNOSIS — Z3483 Encounter for supervision of other normal pregnancy, third trimester: Secondary | ICD-10-CM | POA: Diagnosis not present

## 2018-06-09 LAB — POCT URINALYSIS DIPSTICK OB
Bilirubin, UA: NEGATIVE
Glucose, UA: NEGATIVE
KETONES UA: NEGATIVE
Leukocytes, UA: NEGATIVE
NITRITE UA: NEGATIVE
PH UA: 6.5 (ref 5.0–8.0)
PROTEIN: NEGATIVE
RBC UA: NEGATIVE
SPEC GRAV UA: 1.015 (ref 1.010–1.025)
Urobilinogen, UA: 0.2 E.U./dL

## 2018-06-09 NOTE — Progress Notes (Signed)
ROB: Patient very pleased with Protonix-working well.  Having difficulty sleeping-strategies discussed.  Needs cultures next visit.

## 2018-06-11 ENCOUNTER — Encounter: Payer: Managed Care, Other (non HMO) | Admitting: Obstetrics and Gynecology

## 2018-06-12 ENCOUNTER — Telehealth: Payer: Self-pay

## 2018-06-12 NOTE — Telephone Encounter (Signed)
Pt called complaining of "itchy skin" disrupting her sleep. Pt has no rash, redness, fever or any other symptoms. Pt has not used any different soaps or detergents or been around any allergens. Advised pt to try an antihistamine such as benadryl or zyrtek and hydrocortisone cream to treat itching. Pt agreed and decided if itching persists over the weekend, she will make an appt Monday morning.

## 2018-06-26 ENCOUNTER — Ambulatory Visit (INDEPENDENT_AMBULATORY_CARE_PROVIDER_SITE_OTHER): Payer: Managed Care, Other (non HMO) | Admitting: Obstetrics and Gynecology

## 2018-06-26 VITALS — BP 122/75 | HR 97 | Wt 206.8 lb

## 2018-06-26 DIAGNOSIS — Z3483 Encounter for supervision of other normal pregnancy, third trimester: Secondary | ICD-10-CM

## 2018-06-26 DIAGNOSIS — Z3685 Encounter for antenatal screening for Streptococcus B: Secondary | ICD-10-CM

## 2018-06-26 DIAGNOSIS — Z8759 Personal history of other complications of pregnancy, childbirth and the puerperium: Secondary | ICD-10-CM

## 2018-06-26 DIAGNOSIS — O09293 Supervision of pregnancy with other poor reproductive or obstetric history, third trimester: Secondary | ICD-10-CM

## 2018-06-26 LAB — POCT URINALYSIS DIPSTICK OB
Bilirubin, UA: NEGATIVE
Blood, UA: NEGATIVE
Glucose, UA: NEGATIVE
KETONES UA: NEGATIVE
NITRITE UA: NEGATIVE
PH UA: 7.5 (ref 5.0–8.0)
Spec Grav, UA: 1.015 (ref 1.010–1.025)
Urobilinogen, UA: 0.2 E.U./dL

## 2018-06-26 NOTE — Progress Notes (Signed)
ROB: Complaints of pressure and pian in vaginal area.  Noting irregular contractions. Also thinks she may have lost her mucus plug.  Also noting swelling in ankles.  Given labor precautions. Patient notes last pregnancy with delivery at 38 weeks, was precipitous. Advised on going to hospital immediately for any s/s of labor. 3rd trimester cultures done today. RTC in 1 week.

## 2018-06-26 NOTE — Progress Notes (Signed)
ROB-PT stated that she is having a lot of pain, pressure in the vaginal area and contractions. Pt think she may have lost her mucus plug. Pt stated having a lot of swelling in her ankles. Increase in nausea no vomiting No other complaints.

## 2018-06-28 ENCOUNTER — Observation Stay (HOSPITAL_BASED_OUTPATIENT_CLINIC_OR_DEPARTMENT_OTHER)
Admission: EM | Admit: 2018-06-28 | Discharge: 2018-06-28 | Disposition: A | Discharge status: Home / Self Care | Payer: Managed Care, Other (non HMO) | Source: Home / Self Care | Admitting: Obstetrics and Gynecology

## 2018-06-28 ENCOUNTER — Encounter: Payer: Self-pay | Admitting: *Deleted

## 2018-06-28 ENCOUNTER — Other Ambulatory Visit: Payer: Self-pay

## 2018-06-28 ENCOUNTER — Inpatient Hospital Stay
Admission: EM | Admit: 2018-06-28 | Discharge: 2018-07-01 | DRG: 807 | Disposition: A | Discharge status: Home / Self Care | Payer: Managed Care, Other (non HMO) | Attending: Obstetrics and Gynecology | Admitting: Obstetrics and Gynecology

## 2018-06-28 DIAGNOSIS — F329 Major depressive disorder, single episode, unspecified: Secondary | ICD-10-CM | POA: Diagnosis present

## 2018-06-28 DIAGNOSIS — F325 Major depressive disorder, single episode, in full remission: Secondary | ICD-10-CM

## 2018-06-28 DIAGNOSIS — Z87891 Personal history of nicotine dependence: Secondary | ICD-10-CM

## 2018-06-28 DIAGNOSIS — O368130 Decreased fetal movements, third trimester, not applicable or unspecified: Secondary | ICD-10-CM | POA: Diagnosis present

## 2018-06-28 DIAGNOSIS — O4693 Antepartum hemorrhage, unspecified, third trimester: Secondary | ICD-10-CM

## 2018-06-28 DIAGNOSIS — O368190 Decreased fetal movements, unspecified trimester, not applicable or unspecified: Secondary | ICD-10-CM | POA: Diagnosis present

## 2018-06-28 DIAGNOSIS — O9081 Anemia of the puerperium: Secondary | ICD-10-CM | POA: Diagnosis not present

## 2018-06-28 DIAGNOSIS — O99824 Streptococcus B carrier state complicating childbirth: Secondary | ICD-10-CM | POA: Diagnosis present

## 2018-06-28 DIAGNOSIS — F1290 Cannabis use, unspecified, uncomplicated: Secondary | ICD-10-CM

## 2018-06-28 DIAGNOSIS — J45909 Unspecified asthma, uncomplicated: Secondary | ICD-10-CM | POA: Diagnosis present

## 2018-06-28 DIAGNOSIS — Z3A37 37 weeks gestation of pregnancy: Secondary | ICD-10-CM | POA: Insufficient documentation

## 2018-06-28 DIAGNOSIS — O99344 Other mental disorders complicating childbirth: Secondary | ICD-10-CM | POA: Diagnosis present

## 2018-06-28 DIAGNOSIS — O9952 Diseases of the respiratory system complicating childbirth: Secondary | ICD-10-CM | POA: Diagnosis present

## 2018-06-28 LAB — STREP GP B NAA: Strep Gp B NAA: POSITIVE — AB

## 2018-06-28 NOTE — Progress Notes (Signed)
L&D OB Triage Note  Jean Foster is a 32 y.o. G2P1001 female at [redacted]w[redacted]d, EDD Estimated Date of Delivery: 07/17/18 who presented to triage for complaints of vaginal bleeding (noticed a nickel-sized blood clot x 1) and decreased fetal movement.  She was evaluated by the nurses with no significant findings for further bleeding, and active fetal movement was present while in triage. Contractions were noted, but not bothersome to patient. Vital signs stable. An NST was performed and has been reviewed by MD.   NST INTERPRETATION: Indications: decreased fetal movement, vaginal bleeding and rule out uterine contractions  Mode: External Baseline Rate (A): 134 bpm(fhr at this time) Variability: Moderate Accelerations: 15 x 15       Contraction Frequency (min): 3-4/UI  Impression: reactive   Plan: NST performed was reviewed and was found to be reactive. She was discharged home with bleeding/labor precautions.  Discussed fetal kick counts.  Continue routine prenatal care. Follow up with OB/GYN as previously scheduled.     Rubie Maid, MD  [facility]

## 2018-06-28 NOTE — Discharge Instructions (Signed)
Please keep your next scheduled appointment. If you have questions or concerns please call the on call provider.  You may also call the nurse's desk at the [facility] for questions.  If you have urgent concerns please go to the nearest emergency department for evaluation.

## 2018-06-29 DIAGNOSIS — O9952 Diseases of the respiratory system complicating childbirth: Secondary | ICD-10-CM | POA: Diagnosis present

## 2018-06-29 DIAGNOSIS — J45909 Unspecified asthma, uncomplicated: Secondary | ICD-10-CM | POA: Diagnosis present

## 2018-06-29 DIAGNOSIS — Z3A37 37 weeks gestation of pregnancy: Secondary | ICD-10-CM

## 2018-06-29 DIAGNOSIS — O368130 Decreased fetal movements, third trimester, not applicable or unspecified: Secondary | ICD-10-CM | POA: Diagnosis present

## 2018-06-29 DIAGNOSIS — O9081 Anemia of the puerperium: Secondary | ICD-10-CM | POA: Diagnosis not present

## 2018-06-29 DIAGNOSIS — Z87891 Personal history of nicotine dependence: Secondary | ICD-10-CM | POA: Diagnosis not present

## 2018-06-29 DIAGNOSIS — O99344 Other mental disorders complicating childbirth: Secondary | ICD-10-CM | POA: Diagnosis present

## 2018-06-29 DIAGNOSIS — Z3483 Encounter for supervision of other normal pregnancy, third trimester: Secondary | ICD-10-CM | POA: Diagnosis present

## 2018-06-29 DIAGNOSIS — O99824 Streptococcus B carrier state complicating childbirth: Secondary | ICD-10-CM | POA: Diagnosis present

## 2018-06-29 DIAGNOSIS — F329 Major depressive disorder, single episode, unspecified: Secondary | ICD-10-CM | POA: Diagnosis present

## 2018-06-29 LAB — URINE DRUG SCREEN, QUALITATIVE (ARMC ONLY)
AMPHETAMINES, UR SCREEN: NOT DETECTED
BARBITURATES, UR SCREEN: NOT DETECTED
Benzodiazepine, Ur Scrn: NOT DETECTED
COCAINE METABOLITE, UR ~~LOC~~: NOT DETECTED
Cannabinoid 50 Ng, Ur ~~LOC~~: NOT DETECTED
MDMA (ECSTASY) UR SCREEN: NOT DETECTED
METHADONE SCREEN, URINE: NOT DETECTED
OPIATE, UR SCREEN: NOT DETECTED
Phencyclidine (PCP) Ur S: NOT DETECTED
Tricyclic, Ur Screen: NOT DETECTED

## 2018-06-29 LAB — CBC
HCT: 32.9 % — ABNORMAL LOW (ref 36.0–46.0)
HEMOGLOBIN: 10.5 g/dL — AB (ref 12.0–15.0)
MCH: 24.8 pg — AB (ref 26.0–34.0)
MCHC: 31.9 g/dL (ref 30.0–36.0)
MCV: 77.6 fL — AB (ref 80.0–100.0)
Platelets: 328 10*3/uL (ref 150–400)
RBC: 4.24 MIL/uL (ref 3.87–5.11)
RDW: 16.1 % — ABNORMAL HIGH (ref 11.5–15.5)
WBC: 16.9 10*3/uL — ABNORMAL HIGH (ref 4.0–10.5)
nRBC: 0 % (ref 0.0–0.2)

## 2018-06-29 LAB — TYPE AND SCREEN
ABO/RH(D): A POS
Antibody Screen: NEGATIVE

## 2018-06-29 LAB — RAPID HIV SCREEN (HIV 1/2 AB+AG)
HIV 1/2 Antibodies: NONREACTIVE
HIV-1 P24 ANTIGEN - HIV24: NONREACTIVE

## 2018-06-29 MED ORDER — ACETAMINOPHEN 325 MG PO TABS
650.0000 mg | ORAL_TABLET | ORAL | Status: DC | PRN
  Administered 2018-06-29: 650 mg via ORAL
  Filled 2018-06-29: qty 2

## 2018-06-29 MED ORDER — BENZOCAINE-MENTHOL 20-0.5 % EX AERO: 1.0000 "application " | INHALATION_SPRAY | CUTANEOUS | Status: DC | PRN

## 2018-06-29 MED ORDER — SENNOSIDES-DOCUSATE SODIUM 8.6-50 MG PO TABS
2.0000 | ORAL_TABLET | ORAL | Status: DC
  Administered 2018-06-29 – 2018-07-01 (×3): 2 via ORAL
  Filled 2018-06-29 (×3): qty 2

## 2018-06-29 MED ORDER — ONDANSETRON HCL 4 MG/2ML IJ SOLN: 4.0000 mg | INTRAMUSCULAR | Status: DC | PRN

## 2018-06-29 MED ORDER — DIBUCAINE 1 % RE OINT: 1.0000 "application " | TOPICAL_OINTMENT | RECTAL | Status: DC | PRN

## 2018-06-29 MED ORDER — ONDANSETRON HCL 4 MG PO TABS
4.0000 mg | ORAL_TABLET | ORAL | Status: DC | PRN
  Administered 2018-06-30 – 2018-07-01 (×2): 4 mg via ORAL
  Filled 2018-06-29 (×2): qty 1

## 2018-06-29 MED ORDER — SODIUM CHLORIDE 0.9 % IV SOLN
1.0000 g | INTRAVENOUS | Status: DC
  Filled 2018-06-29 (×3): qty 1000

## 2018-06-29 MED ORDER — SODIUM CHLORIDE 0.9 % IV SOLN
2.0000 g | Freq: Once | INTRAVENOUS | Status: DC
  Filled 2018-06-29: qty 2000

## 2018-06-29 MED ORDER — COCONUT OIL OIL: 1.0000 "application " | TOPICAL_OIL | Status: DC | PRN

## 2018-06-29 MED ORDER — ACETAMINOPHEN 325 MG PO TABS: 650.0000 mg | ORAL_TABLET | ORAL | Status: DC | PRN

## 2018-06-29 MED ORDER — TERBUTALINE SULFATE 1 MG/ML IJ SOLN: 0.2500 mg | Freq: Once | INTRAMUSCULAR | Status: DC | PRN

## 2018-06-29 MED ORDER — PHENYLEPHRINE 40 MCG/ML (10ML) SYRINGE FOR IV PUSH (FOR BLOOD PRESSURE SUPPORT)
PREFILLED_SYRINGE | INTRAVENOUS | Status: AC
  Filled 2018-06-29: qty 10

## 2018-06-29 MED ORDER — ONDANSETRON HCL 4 MG/2ML IJ SOLN: 4.0000 mg | Freq: Four times a day (QID) | INTRAMUSCULAR | Status: DC | PRN

## 2018-06-29 MED ORDER — IBUPROFEN 800 MG PO TABS
800.0000 mg | ORAL_TABLET | Freq: Four times a day (QID) | ORAL | Status: DC
  Administered 2018-06-29 – 2018-07-01 (×10): 800 mg via ORAL
  Filled 2018-06-29 (×11): qty 1

## 2018-06-29 MED ORDER — LIDOCAINE HCL (PF) 1 % IJ SOLN
INTRAMUSCULAR | Status: AC
  Filled 2018-06-29: qty 30

## 2018-06-29 MED ORDER — SIMETHICONE 80 MG PO CHEW: 80.0000 mg | CHEWABLE_TABLET | ORAL | Status: DC | PRN

## 2018-06-29 MED ORDER — LACTATED RINGERS IV SOLN: 500.0000 mL | INTRAVENOUS | Status: DC | PRN

## 2018-06-29 MED ORDER — OXYTOCIN 40 UNITS IN LACTATED RINGERS INFUSION - SIMPLE MED: 1.0000 m[IU]/min | INTRAVENOUS | Status: DC

## 2018-06-29 MED ORDER — MISOPROSTOL 200 MCG PO TABS
ORAL_TABLET | ORAL | Status: AC
  Filled 2018-06-29: qty 4

## 2018-06-29 MED ORDER — WITCH HAZEL-GLYCERIN EX PADS
1.0000 "application " | MEDICATED_PAD | CUTANEOUS | Status: DC | PRN
  Administered 2018-06-30: 1 via TOPICAL
  Filled 2018-06-29: qty 100

## 2018-06-29 MED ORDER — DIPHENHYDRAMINE HCL 25 MG PO CAPS: 25.0000 mg | ORAL_CAPSULE | Freq: Four times a day (QID) | ORAL | Status: DC | PRN

## 2018-06-29 MED ORDER — ZOLPIDEM TARTRATE 5 MG PO TABS: 5.0000 mg | ORAL_TABLET | Freq: Every evening | ORAL | Status: DC | PRN

## 2018-06-29 MED ORDER — OXYTOCIN 40 UNITS IN LACTATED RINGERS INFUSION - SIMPLE MED
INTRAVENOUS | Status: AC
  Filled 2018-06-29: qty 1000

## 2018-06-29 MED ORDER — PRENATAL MULTIVITAMIN CH
1.0000 | ORAL_TABLET | Freq: Every day | ORAL | Status: DC
  Administered 2018-06-29 – 2018-07-01 (×3): 1 via ORAL
  Filled 2018-06-29 (×4): qty 1

## 2018-06-29 MED ORDER — BUPROPION HCL ER (XL) 300 MG PO TB24
300.0000 mg | ORAL_TABLET | Freq: Every day | ORAL | Status: DC
  Administered 2018-06-29 – 2018-07-01 (×3): 300 mg via ORAL
  Filled 2018-06-29 (×3): qty 1

## 2018-06-29 MED ORDER — SOD CITRATE-CITRIC ACID 500-334 MG/5ML PO SOLN: 30.0000 mL | ORAL | Status: DC | PRN

## 2018-06-29 MED ORDER — OXYCODONE-ACETAMINOPHEN 5-325 MG PO TABS
1.0000 | ORAL_TABLET | ORAL | Status: DC | PRN
  Administered 2018-06-29: 1 via ORAL
  Filled 2018-06-29: qty 1

## 2018-06-29 MED ORDER — BUTORPHANOL TARTRATE 1 MG/ML IJ SOLN
1.0000 mg | INTRAMUSCULAR | Status: DC | PRN
  Administered 2018-06-29: 1 mg via INTRAVENOUS
  Filled 2018-06-29: qty 1

## 2018-06-29 MED ORDER — OXYCODONE-ACETAMINOPHEN 5-325 MG PO TABS: 2.0000 | ORAL_TABLET | ORAL | Status: DC | PRN

## 2018-06-29 MED ORDER — OXYTOCIN BOLUS FROM INFUSION
500.0000 mL | Freq: Once | INTRAVENOUS | Status: AC
  Administered 2018-06-29: 500 mL via INTRAVENOUS

## 2018-06-29 MED ORDER — LACTATED RINGERS IV SOLN: INTRAVENOUS | Status: DC

## 2018-06-29 MED ORDER — OXYTOCIN 10 UNIT/ML IJ SOLN
INTRAMUSCULAR | Status: AC
  Filled 2018-06-29: qty 2

## 2018-06-29 MED ORDER — OXYTOCIN 40 UNITS IN LACTATED RINGERS INFUSION - SIMPLE MED: 2.5000 [IU]/h | INTRAVENOUS | Status: DC

## 2018-06-29 MED ORDER — FENTANYL 2.5 MCG/ML W/ROPIVACAINE 0.15% IN NS 100 ML EPIDURAL (ARMC)
EPIDURAL | Status: AC
  Filled 2018-06-29: qty 100

## 2018-06-29 MED ORDER — LIDOCAINE HCL (PF) 1 % IJ SOLN: 30.0000 mL | INTRAMUSCULAR | Status: DC | PRN

## 2018-06-29 MED ORDER — SENNOSIDES-DOCUSATE SODIUM 8.6-50 MG PO TABS: 2.0000 | ORAL_TABLET | ORAL | Status: DC

## 2018-06-29 MED ORDER — AMMONIA AROMATIC IN INHA
RESPIRATORY_TRACT | Status: AC
  Filled 2018-06-29: qty 10

## 2018-06-29 NOTE — OB Triage Note (Signed)
Pt c/o constant back pain, occasional CTX and vaginal bleeding all worsening this pm. Normal fetal movement and no LOF.

## 2018-06-29 NOTE — H&P (Addendum)
Obstetric History and Physical  Jean Foster is a 32 y.o. G2P1001 with IUP at [redacted]w[redacted]d presenting for complaints of constant back pain, contractions, and vaginal bleeding. Notes that she filled 2 pads at home and passed several nickel-sized clots. Patient states she has been having irregular contractions for the past 2 days, intact membranes, with decreased  fetal movement.  Of note, patient was seen earlier today in triage for decreased fetal movement and vaginal spotting, with reassuring fetal tracing.   Prenatal Course Source of Care: [facility] with onset of care at 10 weeks Pregnancy complications or risks: Patient Active Problem List   Diagnosis Date Noted  . Labor and delivery indication for care or intervention 06/29/2018  . Decreased fetal movement 06/28/2018  . Marijuana use, episodic 02/22/2018  . Depression 10/02/2015  . Human papilloma virus infection 10/02/2015   She plans to breastfeed She desires Mirena IUD for postpartum contraception.   Prenatal labs and studies: ABO, Rh: A/Positive/-- (04/08 1039) Antibody: Negative (04/08 1039) Rubella: 1.31 (04/08 1039) RPR: Non Reactive (08/13 0924)  HBsAg: Negative (04/08 1039)  HIV: Non Reactive (04/08 1039)  RXV:QMGQQPYP (10/11 1110) 1 hr Glucola  normal Genetic screening normal Anatomy US normal   Past Medical History:  Diagnosis Date  . Acne   . Asthma   . Depression     Past Surgical History:  Procedure Laterality Date  . NO PAST SURGERIES    . TOOTH EXTRACTION      OB History  Gravida Para Term Preterm AB Living  2 1 1     1   SAB TAB Ectopic Multiple Live Births          1    # Outcome Date GA Lbr Len/2nd Weight Sex Delivery Anes PTL Lv  2 Current           1 Term 2013   3856 g M Vag-Spont   LIV    Social History   Socioeconomic History  . Marital status: Divorced    Spouse name: Not on file  . Number of children: Not on file  . Years of education: Not on file  . Highest education  level: Not on file  Occupational History  . Not on file  Social Needs  . Financial resource strain: Not on file  . Food insecurity:    Worry: Not on file    Inability: Not on file  . Transportation needs:    Medical: Not on file    Non-medical: Not on file  Tobacco Use  . Smoking status: Former Smoker    Types: Cigarettes    Last attempt to quit: 06/16/2017    Years since quitting: 1.0  . Smokeless tobacco: Never Used  Substance and Sexual Activity  . Alcohol use: No    Frequency: Never  . Drug use: No    Types: Marijuana    Comment: last used 03/2017  . Sexual activity: Yes    Birth control/protection: None    Comment: plans paraguard after delivery  Lifestyle  . Physical activity:    Days per week: Not on file    Minutes per session: Not on file  . Stress: Not on file  Relationships  . Social connections:    Talks on phone: Not on file    Gets together: Not on file    Attends religious service: Not on file    Active member of club or organization: Not on file    Attends meetings of clubs or organizations:  Not on file    Relationship status: Not on file  Other Topics Concern  . Not on file  Social History Narrative  . Not on file    Family History  Problem Relation Age of Onset  . Breast cancer Neg Hx   . Ovarian cancer Neg Hx   . Colon cancer Neg Hx   . Diabetes Neg Hx     Medications Prior to Admission  Medication Sig Dispense Refill Last Dose  . acetaminophen (TYLENOL) 500 MG tablet Take 1,000 mg by mouth every 6 (six) hours as needed.   06/28/2018 at Unknown time  . albuterol (PROVENTIL HFA;VENTOLIN HFA) 108 (90 Base) MCG/ACT inhaler Inhale into the lungs every 6 (six) hours as needed for wheezing or shortness of breath.   Past Month at Unknown time  . buPROPion (WELLBUTRIN XL) 300 MG 24 hr tablet Take 300 mg by mouth daily.   06/28/2018 at Unknown time  . Cetirizine HCl (ZYRTEC ALLERGY) 10 MG CAPS Take by mouth.   06/28/2018 at Unknown time  .  pantoprazole (PROTONIX) 40 MG tablet Take 1 tablet (40 mg total) by mouth daily. 30 tablet 3 06/28/2018 at Unknown time  . Prenatal Vit-Fe Fumarate-FA (MULTIVITAMIN-PRENATAL) 27-0.8 MG TABS tablet Take 1 tablet by mouth daily at 12 noon.   06/28/2018 at Unknown time  . ranitidine (ZANTAC) 150 MG tablet Take 150 mg by mouth 2 (two) times daily.    Past Month at Unknown time  . vitamin B-12 (CYANOCOBALAMIN) 500 MCG tablet Take 500 mcg by mouth daily.   06/28/2018 at Unknown time  . vitamin C (ASCORBIC ACID) 500 MG tablet Take 500 mg by mouth daily.   06/28/2018 at Unknown time    No Known Allergies  Review of Systems: Negative except for what is mentioned in HPI.  Physical Exam: BP (!) 137/91 (BP Location: Left Arm)   Pulse 86   Temp 98.8 F (37.1 C) (Oral)   Resp 18   LMP 10/10/2017  CONSTITUTIONAL: Well-developed, well-nourished female in no acute distress.  HENT:  Normocephalic, atraumatic, External right and left ear normal. Oropharynx is clear and moist EYES: Conjunctivae and EOM are normal. Pupils are equal, round, and reactive to light. No scleral icterus.  NECK: Normal range of motion, supple, no masses SKIN: Skin is warm and dry. No rash noted. Not diaphoretic. No erythema. No pallor. NEUROLOGIC: Alert and oriented to person, place, and time. Normal reflexes, muscle tone coordination. No cranial nerve deficit noted. PSYCHIATRIC: Normal mood and affect. Normal behavior. Normal judgment and thought content. CARDIOVASCULAR: Normal heart rate noted, regular rhythm RESPIRATORY: Effort and breath sounds normal, no problems with respiration noted ABDOMEN: Soft, nontender, nondistended, gravid. MUSCULOSKELETAL: Normal range of motion. No edema and no tenderness. 2+ distal pulses.  Cervical Exam: Dilatation 4.5 cm   Effacement 60%   Station -3   Presentation: cephalic FHT:  Baseline rate 140 bpm   Variability moderate  Accelerations present   Decelerations 1 variable  deceleration Contractions: irregular 4-5  mins   Pertinent Labs/Studies:   No results found for this or any previous visit (from the past 24 hour(s)).  Assessment : Jean Foster is a 32 y.o. G2P1001 at [redacted]w[redacted]d being admitted for labor.  GBS positive. H/o asthma (uses albuterol prn) and depression (on Wellbutrin)  Plan: Labor: Expectant management. Augmentation as needed with Pitocin, per protocol. Analgesia as needed. Desires epidural.3 FWB: Reassuring fetal heart tracing.  GBS positive. Will start ampicillin.  Delivery plan: Hopeful for vaginal delivery  Rubie Maid, MD [facility]

## 2018-06-29 NOTE — Lactation Note (Signed)
This note was copied from a baby's chart. Lactation Consultation Note  Patient Name: Jean Foster UXNAT'[id number] Date: 06/29/2018 Reason for consult: Initial assessment;Early term 37-38.6wks Observed mom breast feed.  [place] latches without assistance and begins good rhythmic sucking with occasional swallow.  Mom reports breast feeding first baby for 3 1/2 months, but started having issues with his demands being higher than supply.  Reviewed routine feeding patterns and growth spurts when they would want to marathon feed for a couple of days.  Explained supply and demand and normal course of lactation.  Lactation name and number written on white board and encouraged to call with any questions, concerns or assistance.   Maternal Data Formula Feeding for Exclusion: No Has patient been taught Hand Expression?: Yes Does the patient have breastfeeding experience prior to this delivery?: Yes  Feeding Feeding Type: Breast Fed  LATCH Score Latch: Grasps breast easily, tongue down, lips flanged, rhythmical sucking.  Audible Swallowing: A few with stimulation  Type of Nipple: Everted at rest and after stimulation  Comfort (Breast/Nipple): Soft / non-tender  Hold (Positioning): No assistance needed to correctly position infant at breast.  LATCH Score: 9  Interventions Interventions: Breast feeding basics reviewed;Position options;Breast compression;Breast massage  Lactation Tools Discussed/Used WIC Program: No([employer])   Consult Status Consult Status: PRN    Jean Foster 06/29/2018, 3:34 PM

## 2018-06-30 LAB — GC/CHLAMYDIA PROBE AMP
CHLAMYDIA, DNA PROBE: NEGATIVE
NEISSERIA GONORRHOEAE BY PCR: NEGATIVE

## 2018-06-30 LAB — CBC
HEMATOCRIT: 29.6 % — AB (ref 36.0–46.0)
Hemoglobin: 9.2 g/dL — ABNORMAL LOW (ref 12.0–15.0)
MCH: 24.9 pg — ABNORMAL LOW (ref 26.0–34.0)
MCHC: 31.1 g/dL (ref 30.0–36.0)
MCV: 80.2 fL (ref 80.0–100.0)
NRBC: 0 % (ref 0.0–0.2)
Platelets: 271 10*3/uL (ref 150–400)
RBC: 3.69 MIL/uL — AB (ref 3.87–5.11)
RDW: 16.6 % — ABNORMAL HIGH (ref 11.5–15.5)
WBC: 12.6 10*3/uL — ABNORMAL HIGH (ref 4.0–10.5)

## 2018-06-30 LAB — RPR: RPR Ser Ql: NONREACTIVE

## 2018-06-30 NOTE — Progress Notes (Signed)
Post Partum Day # 1, s/p SVD  Subjective: no complaints, up ad lib, voiding and tolerating PO  Objective: Temp:  [98 F (36.7 C)-98.7 F (37.1 C)] 98.7 F (37.1 C) (10/15 1618) Pulse Rate:  [78-88] 78 (10/15 1618) Resp:  [18] 18 (10/14 2339) BP: (124-128)/(74-78) 128/74 (10/15 1618) SpO2:  [98 %-99 %] 98 % (10/15 1618)  Physical Exam:  General: alert and no distress  Lungs: clear to auscultation bilaterally Breasts: normal appearance, no masses or tenderness Heart: regular rate and rhythm, S1, S2 normal, no murmur, click, rub or gallop Abdomen: soft, non-tender; bowel sounds normal; no masses,  no organomegaly Pelvis: Lochia: appropriate, Uterine Fundus: firm Extremities: DVT Evaluation: No evidence of DVT seen on physical exam. Negative Homan's sign. No cords or calf tenderness. No significant calf/ankle edema.  Recent Labs    06/29/18 0102 06/30/18 0637  HGB 10.5* 9.2*  HCT 32.9* 29.6*    Assessment/Plan: Doing well postpartum Breastfeeding, however counseled about positive drug screen (marijuana) Circumcision prior to discharge  Contraception Mirena IUD H/o anemia in pregnancy, now in postpartum. Currently asymptomatic. Will treat with PO iron supplementation.  Will discharge home tomorrow.     LOS: 1 day   Rubie Maid, MD [facility] 06/30/2018 6:52 PM

## 2018-06-30 NOTE — Lactation Note (Signed)
This note was copied from a baby's chart. Lactation Consultation Note  Patient Name: Boy Demetrica Zipp Today's Date: 06/30/2018     Maternal Data    Feeding    LATCH Score                   Interventions    Lactation Tools Discussed/Used     Consult Status  LC to room during morning rounds to check progress of breastfeeding. Mother states that infant is breastfeeding well and denies any questions or concerns at this time. Mother states that she has a breast pump at home.    Elvera Lennox 27/03/8674, 1:33 PM

## 2018-07-01 MED ORDER — IBUPROFEN 800 MG PO TABS: 800.0000 mg | ORAL_TABLET | Freq: Four times a day (QID) | ORAL | 0 refills | Status: DC

## 2018-07-01 NOTE — Progress Notes (Signed)
Patient discharged home with infant. Discharge instructions, prescriptions and follow up appointment given to and reviewed with patient. Patient verbalized understanding. Patient wheeled out with infant by auxillary.

## 2018-07-01 NOTE — Lactation Note (Signed)
This note was copied from a baby's chart. Lactation Consultation Note  Patient Name: Jean Foster Today's Date: 07/01/2018     Maternal Data    Feeding Feeding Type: Breast Fed  LATCH Score                   Interventions    Lactation Tools Discussed/Used     Consult Status   LC spoke about breastmilk storage, input/output for baby, supplementation and resources for discharge. Baby lost -7% since birth, but mom states she feels that her milk has changed from colostrum to true breastmilk and has felt her letdown reflex. Baby has had lots of stools in the past 24hrs as well. LC instructed mom to monitor output over the next 24hrs to see if supplementation needs to take place. She can pump and feed baby expressed milk afterwards or formula feed.   Marnee Spring 07/01/2018, 11:38 AM

## 2018-07-01 NOTE — Progress Notes (Signed)
Post Partum Day # 2, s/p SVD  Subjective: no complaints, up ad lib, voiding and tolerating PO  Objective: Vitals:   06/29/18 1559 06/29/18 2339 06/30/18 1618 07/01/18 0126  BP: 129/82 124/78 128/74 122/78  Pulse: 75 88 78 66  Resp: 18 18  16   Temp: 98.3 F (36.8 C) 98 F (36.7 C) 98.7 F (37.1 C) 98.4 F (36.9 C)  TempSrc: Axillary Oral Axillary Oral  SpO2:  99% 98%   Weight:      Height:         Physical Exam:  General: alert and no distress  Lungs: clear to auscultation bilaterally Breasts: normal appearance, no masses or tenderness Heart: regular rate and rhythm, S1, S2 normal, no murmur, click, rub or gallop Abdomen: soft, non-tender; bowel sounds normal; no masses,  no organomegaly Pelvis: Lochia: appropriate, Uterine Fundus: firm Extremities: DVT Evaluation: No evidence of DVT seen on physical exam. Negative Homan's sign. No cords or calf tenderness. No significant calf/ankle edema.  Recent Labs    06/29/18 0102 06/30/18 0637  HGB 10.5* 9.2*  HCT 32.9* 29.6*    Assessment/Plan: Doing well  Breastfeeding, however previously counseled about positive drug screen (marijuana) Circumcision prior to discharge  Contraception Mirena IUD H/o anemia in pregnancy, now in postpartum. Currently asymptomatic. Will treat with PO iron supplementation.  Discharge home today.     LOS: 2 days   Rubie Maid, MD [facility] 07/01/2018 7:49 AM

## 2018-07-01 NOTE — Discharge Summary (Signed)
OB Discharge Summary     Patient Name: Jean Foster DOB: [birth year] MRN: [id number]  Date of admission: 06/28/2018 Delivering MD: Rubie Maid   Date of discharge: 07/01/2018  Admitting diagnosis: Labor at 37 weeks Intrauterine pregnancy: [redacted]w[redacted]d     Secondary diagnosis:  Active Problems:   Labor and delivery indication for care or intervention  Additional problems: Anemia of pregnancy     Discharge diagnosis: Term Pregnancy Delivered and Anemia                                                                                                Post partum procedures:None  Augmentation: None  Complications: None  Hospital course:  Onset of Labor With Vaginal Delivery     32 y.o. yo [id number] at [redacted]w[redacted]d was admitted in Active Labor on 06/28/2018. Patient had an uncomplicated labor course as follows:  Membrane Rupture Time/Date: 1:16 AM ,06/29/2018   Intrapartum Procedures: Episiotomy: None [1]                                         Lacerations:  1st degree [2]  Patient had a delivery of a Viable infant. 06/29/2018  Information for the patient's newborn:  Ernestine Mcmurray [[id number]]  Delivery Method: Vag-Spont    Pateint had an uncomplicated postpartum course.  She is ambulating, tolerating a regular diet, passing flatus, and urinating well. Patient is discharged home in stable condition on 07/05/18.     Physical exam  Vitals:   06/29/18 1559 06/29/18 2339 06/30/18 1618 07/01/18 0126  BP: 129/82 124/78 128/74 122/78  Pulse: 75 88 78 66  Resp: 18 18  16   Temp: 98.3 F (36.8 C) 98 F (36.7 C) 98.7 F (37.1 C) 98.4 F (36.9 C)  TempSrc: Axillary Oral Axillary Oral  SpO2:  99% 98%   Weight:      Height:       General: alert and no distress Lochia: appropriate Uterine Fundus: firm Incision: N/A DVT Evaluation: No evidence of DVT seen on physical exam. Negative Homan's sign. No cords or calf tenderness. No significant calf/ankle edema. Labs: Lab Results   Component Value Date   WBC 12.6 (H) 06/30/2018   HGB 9.2 (L) 06/30/2018   HCT 29.6 (L) 06/30/2018   MCV 80.2 06/30/2018   PLT 271 06/30/2018   CMP Latest Ref Rng & Units 06/23/2017  Glucose 65 - 99 mg/dL 163(H)  BUN 6 - 20 mg/dL 8  Creatinine 0.44 - 1.00 mg/dL 1.04(H)  Sodium 135 - 145 mmol/L 142  Potassium 3.5 - 5.1 mmol/L 3.4(L)  Chloride 101 - 111 mmol/L 107  CO2 22 - 32 mmol/L 26  Calcium 8.9 - 10.3 mg/dL 10.6(H)    Discharge instruction: per After Visit Summary and "Baby and Me Booklet".  After visit meds:  Allergies as of 07/01/2018   No Known Allergies     Medication List    STOP taking these medications   acetaminophen 500 MG tablet Commonly known as:  TYLENOL  pantoprazole 40 MG tablet Commonly known as:  PROTONIX   ranitidine 150 MG tablet Commonly known as:  ZANTAC     TAKE these medications   albuterol 108 (90 Base) MCG/ACT inhaler Commonly known as:  PROVENTIL HFA;VENTOLIN HFA Inhale into the lungs every 6 (six) hours as needed for wheezing or shortness of breath.   ibuprofen 800 MG tablet Commonly known as:  ADVIL,MOTRIN Take 1 tablet (800 mg total) by mouth every 6 (six) hours.   multivitamin-prenatal 27-0.8 MG Tabs tablet Take 1 tablet by mouth daily at 12 noon.   vitamin B-12 500 MCG tablet Commonly known as:  CYANOCOBALAMIN Take 500 mcg by mouth daily.   vitamin C 500 MG tablet Commonly known as:  ASCORBIC ACID Take 500 mg by mouth daily.   WELLBUTRIN XL 300 MG 24 hr tablet Generic drug:  buPROPion Take 300 mg by mouth daily.   ZYRTEC ALLERGY 10 MG Caps Generic drug:  Cetirizine HCl Take by mouth.       Diet: routine diet  Activity: Advance as tolerated. Pelvic rest for 6 weeks.   Outpatient follow up:6 weeks Follow up Appt:No future appointments. Follow up Visit:No follow-ups on file.  Postpartum contraception: IUD Mirena  Newborn Data: Live born female  Birth Weight: 6 lb 2.4 oz (2790 g) APGAR: 8, 9  Newborn  Delivery   Birth date/time:  06/29/2018 01:18:00 Delivery type:  Vaginal, Spontaneous     Baby Feeding: Breast Disposition:home with mother   07/01/2018 Rubie Maid, MD

## 2018-07-03 ENCOUNTER — Encounter: Payer: Managed Care, Other (non HMO) | Admitting: Obstetrics and Gynecology

## 2018-08-10 ENCOUNTER — Ambulatory Visit (INDEPENDENT_AMBULATORY_CARE_PROVIDER_SITE_OTHER): Payer: Managed Care, Other (non HMO) | Admitting: Obstetrics and Gynecology

## 2018-08-10 ENCOUNTER — Encounter: Payer: Self-pay | Admitting: Obstetrics and Gynecology

## 2018-08-10 DIAGNOSIS — Z30430 Encounter for insertion of intrauterine contraceptive device: Secondary | ICD-10-CM | POA: Diagnosis not present

## 2018-08-10 DIAGNOSIS — O9081 Anemia of the puerperium: Secondary | ICD-10-CM

## 2018-08-10 DIAGNOSIS — Z3202 Encounter for pregnancy test, result negative: Secondary | ICD-10-CM | POA: Diagnosis not present

## 2018-08-10 DIAGNOSIS — F325 Major depressive disorder, single episode, in full remission: Secondary | ICD-10-CM

## 2018-08-10 LAB — POCT URINE PREGNANCY: PREG TEST UR: NEGATIVE

## 2018-08-10 MED ORDER — BUPROPION HCL ER (XL) 300 MG PO TB24: 300.0000 mg | ORAL_TABLET | Freq: Every day | ORAL | 6 refills | Status: DC

## 2018-08-10 NOTE — Progress Notes (Signed)
OBSTETRICS POSTPARTUM CLINIC PROGRESS NOTE  Subjective:     Jean Foster is a 32 y.o. G21P2002 female who presents for a postpartum visit. She is 6 weeks postpartum following a spontaneous vaginal delivery. I have fully reviewed the prenatal and intrapartum course. The delivery was at 37.5 gestational weeks.  Anesthesia: none. Postpartum course has been well. Baby's course has been well. Baby is feeding by breast. Bleeding: patient has resumed menses, with No LMP recorded.. Bowel function is normal. Bladder function is normal. Patient is sexually active (last encounter Saturday, with condoms). Contraception method desired is ParaGard IUD. Postpartum depression screening: negative (EPDS score is 1).  Of note, patient states that she needs her Wellbutrin refilled. States that she contacted her PCP, who noted that we needed to fill it as she was still seeing GYN within her postpartum period.   The following portions of the patient's history were reviewed and updated as appropriate: allergies, current medications, past family history, past medical history, past social history, past surgical history and problem list.  Review of Systems Pertinent items noted in HPI and remainder of comprehensive ROS otherwise negative.   Objective:    BP 107/62   Pulse 80   Ht 5\' 9"  (1.753 m)   Wt 188 lb 1.6 oz (85.3 kg)   Breastfeeding? Yes   BMI 27.78 kg/m   General:  alert and no distress   Breasts:  inspection negative, no nipple discharge or bleeding, no masses or nodularity palpable  Lungs: clear to auscultation bilaterally  Heart:  regular rate and rhythm, S1, S2 normal, no murmur, click, rub or gallop  Abdomen: soft, non-tender; bowel sounds normal; no masses,  no organomegaly.    Vulva:  normal  Vagina: normal vagina, no discharge, exudate, lesion, or erythema  Cervix:  no cervical motion tenderness and no lesions  Corpus: normal size, contour, position, consistency, mobility, non-tender    Adnexa:  normal adnexa and no mass, fullness, tenderness  Rectal Exam: Not performed.         Labs:  Lab Results  Component Value Date   HGB 9.2 (L) 06/30/2018     Assessment:    Routine postpartum exam.    Postpartum anemia  Depression, in remission IUD insertion   Plan:    1. Contraception: IUD, ParaGard placed today (see insertion note below). Advised on back up method x 1 week.  UPT negative today.   2. Will check Hgb for h/o anemia.  3. To refill Wellbutrin.  4. Follow up in: 4 weeks for IUD check, or sooner as needed.        GYNECOLOGY OFFICE PROCEDURE NOTE  Jean Foster is a 32 y.o. G2P2002 here for ParaGard IUD insertion.  IUD Insertion Procedure Note Patient identified, informed consent performed, consent signed.   Discussed risks of irregular bleeding, cramping, infection, malpositioning or misplacement of the IUD outside the uterus which may require further procedure such as laparoscopy. Time out was performed.  Urine pregnancy test negative.  Speculum placed in the vagina.  Cervix visualized.  Cleaned with Betadine x 2.  Grasped anteriorly with a single tooth tenaculum.  Uterus sounded to 10 cm.  ParaGard IUD placed per manufacturer's recommendations.  Strings trimmed to 3 cm. Tenaculum was removed, good hemostasis noted.  Patient tolerated procedure well.   Patient was given post-procedure instructions.  She was advised to have backup contraception for one week.  Patient was also asked to check IUD strings periodically and follow up in 4 weeks for  IUD check and pap smear.    Lot #: [id number] Exp: 03/2024  Rubie Maid, MD [facility]

## 2018-08-10 NOTE — Progress Notes (Signed)
   PT is present today for her postpartum visit. Pt stated that she is breastfeeding and have had sexually intercourse recently. Pt stated that she would like to get the Paraguardfor birth control. EPDS= 1.  Pt stated that she is doing well no complaints.

## 2018-08-11 LAB — HEMOGLOBIN AND HEMATOCRIT, BLOOD
Hematocrit: 34.9 % (ref 34.0–46.6)
Hemoglobin: 11.5 g/dL (ref 11.1–15.9)

## 2018-09-14 ENCOUNTER — Other Ambulatory Visit (HOSPITAL_COMMUNITY)
Admission: RE | Admit: 2018-09-14 | Discharge: 2018-09-14 | Disposition: A | Discharge status: Home / Self Care | Payer: Managed Care, Other (non HMO) | Source: Ambulatory Visit | Attending: Obstetrics and Gynecology | Admitting: Obstetrics and Gynecology

## 2018-09-14 ENCOUNTER — Ambulatory Visit (INDEPENDENT_AMBULATORY_CARE_PROVIDER_SITE_OTHER): Payer: Managed Care, Other (non HMO) | Admitting: Obstetrics and Gynecology

## 2018-09-14 ENCOUNTER — Encounter: Payer: Self-pay | Admitting: Obstetrics and Gynecology

## 2018-09-14 VITALS — BP 104/70 | HR 84 | Ht 69.0 in | Wt 184.7 lb

## 2018-09-14 DIAGNOSIS — Z124 Encounter for screening for malignant neoplasm of cervix: Secondary | ICD-10-CM

## 2018-09-14 DIAGNOSIS — Z30431 Encounter for routine checking of intrauterine contraceptive device: Secondary | ICD-10-CM | POA: Diagnosis not present

## 2018-09-14 NOTE — Progress Notes (Signed)
Pt is present today for IUD check. Pt stated that she is doing well and started her cycle today.

## 2018-09-14 NOTE — Addendum Note (Signed)
Addended by: Edwyna Shell on: 09/14/2018 11:44 AM   Modules accepted: Orders

## 2018-09-14 NOTE — Progress Notes (Signed)
    GYNECOLOGY OFFICE ENCOUNTER NOTE  History:  32 y.o. [id number] here today for today for IUD string check; Mirena  IUD was placed 08/10/2018. No complaints about the IUD, no concerning side effects.  The following portions of the patient's history were reviewed and updated as appropriate: allergies, current medications, past family history, past medical history, past social history, past surgical history and problem list. Last pap smear ~ 3 years agowas normal, negative HRHPV.  Review of Systems:  Pertinent items are noted in HPI.   Objective:  Physical Exam Blood pressure 104/70, pulse 84, height 5\' 9"  (1.753 m), weight 184 lb 11.2 oz (83.8 kg), last menstrual period 09/14/2018, currently breastfeeding. CONSTITUTIONAL: Well-developed, well-nourished female in no acute distress.  ABDOMEN: Soft, no distention noted.   PELVIC: Normal appearing external genitalia; normal appearing vaginal mucosa and cervix.  IUD strings visualized, about cm in length outside cervix.  EXTREMITIES:  extremities normal, atraumatic, no cyanosis or edema NEUROLOGIC: Grossly normal.   Assessment & Plan:  Patient to keep IUD in place for up to seven years; can come in for removal if she desires pregnancy earlier or for or concerning side effects. Pap smear performed today.   Patient to f/u in 6 months for annual exam.    Rubie Maid, MD [facility]

## 2018-09-18 LAB — CYTOLOGY - PAP
DIAGNOSIS: NEGATIVE
HPV: NOT DETECTED

## 2019-01-01 ENCOUNTER — Other Ambulatory Visit: Payer: Self-pay

## 2019-01-01 ENCOUNTER — Encounter: Payer: Self-pay | Admitting: Family Medicine

## 2019-01-01 ENCOUNTER — Ambulatory Visit (INDEPENDENT_AMBULATORY_CARE_PROVIDER_SITE_OTHER): Payer: No Typology Code available for payment source | Admitting: Family Medicine

## 2019-01-01 DIAGNOSIS — F3341 Major depressive disorder, recurrent, in partial remission: Secondary | ICD-10-CM

## 2019-01-01 DIAGNOSIS — Z975 Presence of (intrauterine) contraceptive device: Secondary | ICD-10-CM

## 2019-01-01 DIAGNOSIS — L700 Acne vulgaris: Secondary | ICD-10-CM | POA: Diagnosis not present

## 2019-01-01 MED ORDER — BUSPIRONE HCL 5 MG PO TABS: 5.0000 mg | ORAL_TABLET | Freq: Two times a day (BID) | ORAL | 1 refills | Status: DC | PRN

## 2019-01-01 MED ORDER — SPIRONOLACTONE 25 MG PO TABS: ORAL_TABLET | ORAL | 1 refills | Status: DC

## 2019-01-01 NOTE — Progress Notes (Signed)
Name: Jean Foster   MRN: [id number]    DOB: 1986-08-01   Date:01/01/2019       Progress Note  Subjective  Chief Complaint  Chief Complaint  Patient presents with  . New Patient (Initial Visit)    I connected with  Jean Foster  on 01/01/19 at 10:40 AM EDT by a video enabled telemedicine application and verified that I am speaking with the correct person using two identifiers.  I discussed the limitations of evaluation and management by telemedicine and the availability of in person appointments. The patient expressed understanding and agreed to proceed. Staff also discussed with the patient that there may be a patient responsible charge related to this service. Patient Location: Home Provider Location: Office Additional Individuals present: None  HPI  Pt presents to establish care - had change in insurance and needs a few referrals due to this.  Derm Referral: She has hormonal cystic acne - taking spironolactone 25mg  BID and aczone.  She has been seeing a dermatologist in [place], but moved about a year ago and needs a new dermatologist. Denies lightheadedness, dizziness, tolerating spironolactone without issues.  Is close to running out of medication, we will provide refill today to bridge to dermatologist.  IUD In Place: Has paraguard - had placed in November 2020.  Periods are heavier than before, but is tolerable.  Needs referral placed to GYN due to insurance - already established with Dr. Marcelline Mates. Has 7mo son and 4.5yo son.  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  She was put on meds for panic attacks was taking Lexapro for 3 weeks and did not do well; hydroxyzine just made her too drowsy.  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 She has been taking Wellbutrin for quite some time and has been doing well.   Patient Active Problem List   Diagnosis Date Noted  .  Marijuana use, episodic 02/22/2018  . Depression 10/02/2015  . Human papilloma virus infection 10/02/2015    Past Surgical History:  Procedure Laterality Date  . NO PAST SURGERIES    . TOOTH EXTRACTION      Family History  Problem Relation Age of Onset  . Hypertension Mother   . COPD Father   . Depression Sister   . Depression Sister   . Bipolar disorder Maternal Grandmother   . Bladder Cancer Maternal Grandfather   . Lung cancer Paternal Grandmother   . Alcoholism Paternal Grandfather   . Cirrhosis Paternal Grandfather   . Breast cancer Neg Hx   . Ovarian cancer Neg Hx   . Colon cancer Neg Hx   . Diabetes Neg Hx     Social History   Socioeconomic History  . Marital status: Divorced    Spouse name: Not on file  . Number of children: 2  . Years of education: 81  . Highest education level: Bachelor's degree (e.g., BA, AB, BS)  Occupational History  . Not on file  Social Needs  . Financial resource strain: Not hard at all  . Food insecurity:    Worry: Never true    Inability: Never true  . Transportation needs:    Medical: No    Non-medical: No  Tobacco Use  . Smoking status: Former Smoker    Types: Cigarettes    Last attempt to quit: 06/16/2017    Years since quitting: 1.5  .  Smokeless tobacco: Never Used  Substance and Sexual Activity  . Alcohol use: Yes    Alcohol/week: 2.0 - 3.0 standard drinks    Types: 2 - 3 Cans of beer per week    Frequency: Never    Comment: once a week  . Drug use: No    Types: Marijuana    Comment: last used 03/2017  . Sexual activity: Yes    Partners: Male    Birth control/protection: I.U.D.    Comment: plans paraguard after delivery  Lifestyle  . Physical activity:    Days per week: 4 days    Minutes per session: 60 min  . Stress: Not at all  Relationships  . Social connections:    Talks on phone: More than three times a week    Gets together: Twice a week    Attends religious service: Never    Active member of club  or organization: No    Attends meetings of clubs or organizations: Never    Relationship status: Divorced  . Intimate partner violence:    Fear of current or ex partner: No    Emotionally abused: No    Physically abused: No    Forced sexual activity: No  Other Topics Concern  . Not on file  Social History Narrative  . Not on file     Current Outpatient Medications:  .  albuterol (PROVENTIL HFA;VENTOLIN HFA) 108 (90 Base) MCG/ACT inhaler, Inhale into the lungs every 6 (six) hours as needed for wheezing or shortness of breath., Disp: , Rfl:  .  buPROPion (WELLBUTRIN XL) 300 MG 24 hr tablet, Take 1 tablet (300 mg total) by mouth daily., Disp: 30 tablet, Rfl: 6 .  Dapsone 7.5 % GEL, Apply 1 application topically daily., Disp: , Rfl:  .  Multiple Vitamin (MULTIVITAMIN) tablet, Take 1 tablet by mouth daily., Disp: , Rfl:  .  spironolactone (ALDACTONE) 25 MG tablet, TK 1 T PO BID WITH WATER, Disp: , Rfl:  .  vitamin B-12 (CYANOCOBALAMIN) 500 MCG tablet, Take 500 mcg by mouth daily., Disp: , Rfl:  .  vitamin C (ASCORBIC ACID) 500 MG tablet, Take 500 mg by mouth daily., Disp: , Rfl:  .  Prenatal Vit-Fe Fumarate-FA (MULTIVITAMIN-PRENATAL) 27-0.8 MG TABS tablet, Take 1 tablet by mouth daily at 12 noon., Disp: , Rfl:   No Known Allergies  I personally reviewed active problem list, medication list, allergies, health maintenance, notes from last encounter, lab results with the patient/caregiver today.   ROS  Constitutional: Negative for fever or weight change.  Respiratory: Negative for cough and shortness of breath.   Cardiovascular: Negative for chest pain or palpitations.  Gastrointestinal: Negative for abdominal pain, no bowel changes.  Musculoskeletal: Negative for gait problem or joint swelling.  Skin: Negative for rash.  Neurological: Negative for dizziness or headache.  No other specific complaints in a complete review of systems (except as listed in HPI above).  Objective   Virtual encounter, vitals not obtained.  There is no height or weight on file to calculate BMI.  Physical Exam Constitutional: Patient appears well-developed and well-nourished. No distress.  HENT: Head: Normocephalic and atraumatic.  Neck: Normal range of motion. Pulmonary/Chest: Effort normal. No respiratory distress. Speaking in complete sentences Neurological: Pt is alert and oriented to person, place, and time. Coordination, speech are normal Psychiatric: Patient has a normal mood and affect. behavior is normal. Judgment and thought content normal.  No results found for this or any previous visit (from the past  72 hour(s)).  PHQ2/9: Depression screen PHQ 2/9 01/01/2019  Decreased Interest 0  Down, Depressed, Hopeless 0  PHQ - 2 Score 0  Altered sleeping 0  Tired, decreased energy 0  Change in appetite 0  Feeling bad or failure about yourself  0  Trouble concentrating 0  Moving slowly or fidgety/restless 0  Suicidal thoughts 0  PHQ-9 Score 0  Difficult doing work/chores Not difficult at all   PHQ-2/9 Result is negative.    Fall Risk: Fall Risk  01/01/2019  Falls in the past year? 0    Assessment & Plan  1. Cystic acne - Ambulatory referral to Dermatology - spironolactone (ALDACTONE) 25 MG tablet; TK 1 T PO BID WITH WATER  Dispense: 180 tablet; Refill: 1  2. Recurrent major depressive disorder, in partial remission ([place]) - Continue wellbutrin, may take Buspar PRN for panic attacks.  May consider PRN Propanolol if buspar not effective.  Did advise Wellbutrin does not do well for those with anxiety, but she has been on for many years and does not want to stop taking. - busPIRone (BUSPAR) 5 MG tablet; Take 1 tablet (5 mg total) by mouth 2 (two) times daily as needed.  Dispense: 30 tablet; Refill: 1  3. IUD (intrauterine device) in place - Ambulatory referral to Obstetrics / Gynecology   I discussed the assessment and treatment plan with the patient. The patient was  provided an opportunity to ask questions and all were answered. The patient agreed with the plan and demonstrated an understanding of the instructions.  The patient was advised to call back or seek an in-person evaluation if the symptoms worsen or if the condition fails to improve as anticipated.  I provided 27 minutes of non-face-to-face time during this encounter.

## 2019-01-18 ENCOUNTER — Encounter: Payer: Self-pay | Admitting: Family Medicine

## 2019-01-18 NOTE — Patient Instructions (Signed)
Panic Attack  A panic attack is when you suddenly feel very afraid, uncomfortable, or nervous (anxious). A panic attack can happen when you are scared or for no reason. A panic attack can feel like a serious problem. It can even feel like a heart attack or stroke. See your doctor when you have a panic attack to make sure you do not have a serious problem. Follow these instructions at home:  Take medicines only as told by your doctor.  If you feel worried or nervous, try not to have caffeine.  Take good care of your health. To do this: ? Eat healthy. Make sure to eat fresh fruits and vegetables, whole grains, lean meats, and low-fat dairy. ? Get enough sleep. Try to sleep for 7-8 hours each night. ? Exercise. Try to be active for 30 minutes 5 or more days a week. ? Do not smoke. Talk to your doctor if you need help quitting. ? Limit how much alcohol you drink:  If you are a woman who is not pregnant: try not to have more than 1 drink a day.  If you are a man: try not to have more than 2 drinks a day.  One drink equals 12 oz of beer, 5 oz of wine, or 1 oz of hard liquor.  Keep all follow-up visits as told by your doctor. This is important. Contact a doctor if:  Your symptoms do not get better.  Your symptoms get worse.  You are not able to take your medicines as told. Get help right away if:  You have thoughts of hurting yourself or others.  You have symptoms of a panic attack. Do not drive yourself to the hospital. Have someone else drive you or call an ambulance. If you feel like you may hurt yourself or others, or have thoughts about taking your own life, get help right away. You can go to your nearest emergency department or call:  Your local emergency services (911 in the [place]  A suicide crisis helpline, such as the [facility] at (407)493-9063. This is open 24 hours a day. Summary  A panic attack is when you suddenly feel very afraid,  uncomfortable, or nervous (anxious).  See your doctor when you have a panic attack to make sure that you do not have another serious problem.  If you feel like you may hurt yourself or others, get help right away by calling 911. This information is not intended to replace advice given to you by your health care provider. Make sure you discuss any questions you have with your health care provider. Document Released: 10/05/2010 Document Revised: 10/16/2016 Document Reviewed: 10/16/2016 Elsevier Interactive Patient Education  2019 [employer].

## 2019-01-25 ENCOUNTER — Encounter: Payer: Self-pay | Admitting: Family Medicine

## 2019-03-04 ENCOUNTER — Other Ambulatory Visit: Payer: Self-pay | Admitting: Family Medicine

## 2019-03-04 DIAGNOSIS — F3341 Major depressive disorder, recurrent, in partial remission: Secondary | ICD-10-CM

## 2019-03-16 ENCOUNTER — Encounter: Payer: Managed Care, Other (non HMO) | Admitting: Obstetrics and Gynecology

## 2019-04-04 ENCOUNTER — Encounter: Payer: Self-pay | Admitting: Family Medicine

## 2019-04-07 ENCOUNTER — Other Ambulatory Visit: Payer: Self-pay | Admitting: Obstetrics and Gynecology

## 2019-04-07 NOTE — Telephone Encounter (Signed)
Please advise. Thanks Diedre Maclellan 

## 2019-04-13 ENCOUNTER — Other Ambulatory Visit: Payer: Self-pay

## 2019-04-13 ENCOUNTER — Ambulatory Visit (INDEPENDENT_AMBULATORY_CARE_PROVIDER_SITE_OTHER): Payer: No Typology Code available for payment source | Admitting: Family Medicine

## 2019-04-13 ENCOUNTER — Encounter: Payer: Self-pay | Admitting: Family Medicine

## 2019-04-13 DIAGNOSIS — F410 Panic disorder [episodic paroxysmal anxiety] without agoraphobia: Secondary | ICD-10-CM | POA: Diagnosis not present

## 2019-04-13 DIAGNOSIS — F3341 Major depressive disorder, recurrent, in partial remission: Secondary | ICD-10-CM

## 2019-04-13 MED ORDER — BUPROPION HCL ER (XL) 150 MG PO TB24: 150.0000 mg | ORAL_TABLET | Freq: Every day | ORAL | 0 refills | Status: DC

## 2019-04-13 MED ORDER — HYDROXYZINE HCL 25 MG PO TABS: 25.0000 mg | ORAL_TABLET | Freq: Three times a day (TID) | ORAL | 1 refills | Status: DC | PRN

## 2019-04-13 MED ORDER — BUSPIRONE HCL 7.5 MG PO TABS: ORAL_TABLET | ORAL | 1 refills | Status: DC

## 2019-04-13 NOTE — Progress Notes (Signed)
Name: Jean Foster   MRN: [id number]    DOB: [birth year]   Date:04/13/2019       Progress Note  Subjective  Chief Complaint  Chief Complaint  Patient presents with  . Anxiety    discuss medication    I connected with  Jean Foster  on 04/13/19 at  8:40 AM EDT by a video enabled telemedicine application and verified that I am speaking with the correct person using two identifiers.  I discussed the limitations of evaluation and management by telemedicine and the availability of in person appointments. The patient expressed understanding and agreed to proceed. Staff also discussed with the patient that there may be a patient responsible charge related to this service. Patient Location: Home Provider Location: Office Additional Individuals present: None  HPI  Pt presents to discuss her anxiety and psychiatric medications.  She sent a note via [place] on 04/04/2019 detailing her recent panic attacks - had been taking her father's Xanax to help with severe panic attacks, but has since run out and is now taking 5mg  Buspar and 25mg  Hydroxyzine to bring her down.  She notes she is having chest pain, palpitations, and shortness of breath during these episodes, and goes away immediately after. She notes that Lexapro caused sexual dysfunction.  Work has been very stressful, she is currently house hunting as well. Denies SI/HI.   Patient Active Problem List   Diagnosis Date Noted  . Cystic acne 01/01/2019  . IUD (intrauterine device) in place 01/01/2019  . Marijuana use, episodic 02/22/2018  . Depression 10/02/2015  . Human papilloma virus infection 10/02/2015    Social History   Tobacco Use  . Smoking status: Former Smoker    Types: Cigarettes    Quit date: 06/16/2017    Years since quitting: 1.8  . Smokeless tobacco: Never Used  Substance Use Topics  . Alcohol use: Yes    Alcohol/week: 2.0 - 3.0 standard drinks    Types: 2 - 3 Cans of beer per week    Frequency: Never    Comment:  once a week     Current Outpatient Medications:  .  albuterol (PROVENTIL HFA;VENTOLIN HFA) 108 (90 Base) MCG/ACT inhaler, Inhale into the lungs every 6 (six) hours as needed for wheezing or shortness of breath., Disp: , Rfl:  .  buPROPion (WELLBUTRIN XL) 300 MG 24 hr tablet, TAKE 1 TABLET(300 MG) BY MOUTH DAILY, Disp: 30 tablet, Rfl: 6 .  busPIRone (BUSPAR) 5 MG tablet, TAKE 1 TABLET(5 MG) BY MOUTH TWICE DAILY AS NEEDED, Disp: 30 tablet, Rfl: 1 .  Dapsone 7.5 % GEL, Apply 1 application topically daily., Disp: , Rfl:  .  Multiple Vitamin (MULTIVITAMIN) tablet, Take 1 tablet by mouth daily., Disp: , Rfl:  .  Prenatal Vit-Fe Fumarate-FA (MULTIVITAMIN-PRENATAL) 27-0.8 MG TABS tablet, Take 1 tablet by mouth daily at 12 noon., Disp: , Rfl:  .  spironolactone (ALDACTONE) 25 MG tablet, TK 1 T PO BID WITH WATER, Disp: 180 tablet, Rfl: 1 .  vitamin B-12 (CYANOCOBALAMIN) 500 MCG tablet, Take 500 mcg by mouth daily., Disp: , Rfl:  .  vitamin C (ASCORBIC ACID) 500 MG tablet, Take 500 mg by mouth daily., Disp: , Rfl:   No Known Allergies  I personally reviewed active problem list, medication list, allergies, notes from last encounter, lab results with the patient/caregiver today.  ROS  Ten systems reviewed and is negative except as mentioned in HPI  Objective  Virtual encounter, vitals not obtained.  There is no  height or weight on file to calculate BMI.  Nursing Note and Vital Signs reviewed.  Physical Exam  Constitutional: Patient appears well-developed and well-nourished. No distress.  HENT: Head: Normocephalic and atraumatic.  Neck: Normal range of motion. Pulmonary/Chest: Effort normal. No respiratory distress. Speaking in complete sentences Neurological: Pt is alert and oriented to person, place, and time. Coordination, speech and gait are normal.  Psychiatric: Patient has a normal mood and affect. behavior is normal. Judgment and thought content normal.  No results found for this or  any previous visit (from the past 72 hour(s)).  Assessment & Plan  1. Recurrent major depressive disorder, in partial remission ([place]) - She has struggled with sexual SE's in the past, is aware that wellbutrin can increase anxiety, buspar does not seem to be causing severe sexual SE's and she is tolerating well.  We will decrease her wellbutrin and increase the buspar to attempt to gain better control of her anxiety.  Add hydroxyzine as she had some left from prior PCP and this seems to help with acute panic attacks.  We will plan to follow up in about a month to re-evaluate, sooner if panic attacks continue to worsen. - buPROPion (WELLBUTRIN XL) 150 MG 24 hr tablet; Take 1 tablet (150 mg total) by mouth daily.  Dispense: 90 tablet; Refill: 0 - busPIRone (BUSPAR) 7.5 MG tablet; Take 1 tablet twice daily.  May take 1 additional tablet if needed for anxiety.  Dispense: 210 tablet; Refill: 1 - hydrOXYzine (ATARAX/VISTARIL) 25 MG tablet; Take 1 tablet (25 mg total) by mouth 3 (three) times daily as needed for anxiety.  Dispense: 30 tablet; Refill: 1  2. Panic attack - buPROPion (WELLBUTRIN XL) 150 MG 24 hr tablet; Take 1 tablet (150 mg total) by mouth daily.  Dispense: 90 tablet; Refill: 0 - busPIRone (BUSPAR) 7.5 MG tablet; Take 1 tablet twice daily.  May take 1 additional tablet if needed for anxiety.  Dispense: 210 tablet; Refill: 1 - hydrOXYzine (ATARAX/VISTARIL) 25 MG tablet; Take 1 tablet (25 mg total) by mouth 3 (three) times daily as needed for anxiety.  Dispense: 30 tablet; Refill: 1   -Red flags and when to present for emergency care or RTC including fever >101.53F, chest pain, shortness of breath, new/worsening/un-resolving symptoms, reviewed with patient at time of visit. Follow up and care instructions discussed and provided in AVS. - I discussed the assessment and treatment plan with the patient. The patient was provided an opportunity to ask questions and all were answered. The patient  agreed with the plan and demonstrated an understanding of the instructions.  I provided 14 minutes of non-face-to-face time during this encounter.  Jean Hartshorn, FNP

## 2019-04-28 ENCOUNTER — Encounter: Payer: No Typology Code available for payment source | Admitting: Family Medicine

## 2019-04-29 ENCOUNTER — Encounter: Payer: Self-pay | Admitting: Family Medicine

## 2019-04-29 ENCOUNTER — Ambulatory Visit (INDEPENDENT_AMBULATORY_CARE_PROVIDER_SITE_OTHER): Payer: No Typology Code available for payment source | Admitting: Family Medicine

## 2019-04-29 ENCOUNTER — Other Ambulatory Visit: Payer: Self-pay

## 2019-04-29 VITALS — BP 106/76 | HR 100 | Temp 98.3°F | Resp 16 | Ht 69.0 in | Wt 161.6 lb

## 2019-04-29 DIAGNOSIS — Z1159 Encounter for screening for other viral diseases: Secondary | ICD-10-CM

## 2019-04-29 DIAGNOSIS — Z13220 Encounter for screening for lipoid disorders: Secondary | ICD-10-CM

## 2019-04-29 DIAGNOSIS — Z0000 Encounter for general adult medical examination without abnormal findings: Secondary | ICD-10-CM

## 2019-04-29 DIAGNOSIS — Z131 Encounter for screening for diabetes mellitus: Secondary | ICD-10-CM

## 2019-04-29 DIAGNOSIS — F410 Panic disorder [episodic paroxysmal anxiety] without agoraphobia: Secondary | ICD-10-CM

## 2019-04-29 DIAGNOSIS — F419 Anxiety disorder, unspecified: Secondary | ICD-10-CM

## 2019-04-29 NOTE — Progress Notes (Signed)
Name: Jean Foster   MRN: [id number]    DOB: [birth year]   Date:04/29/2019       Progress Note  Subjective  Chief Complaint  Chief Complaint  Patient presents with  . Annual Exam  . Follow-up    HPI  Patient presents for annual CPE.  Diet: Balanced Exercise: Not exercising   USPSTF grade A and B recommendations    Office Visit from 04/29/2019 in [facility]  AUDIT-C Score  0     Depression: Phq 9 is  negative Depression screen [facility] 2/9 04/29/2019 04/13/2019 01/01/2019  Decreased Interest 0 0 0  Down, Depressed, Hopeless 0 0 0  PHQ - 2 Score 0 0 0  Altered sleeping 1 1 0  Tired, decreased energy 0 0 0  Change in appetite 0 0 0  Feeling bad or failure about yourself  0 0 0  Trouble concentrating 0 0 0  Moving slowly or fidgety/restless 0 0 0  Suicidal thoughts 0 0 0  PHQ-9 Score 1 1 0  Difficult doing work/chores Not difficult at all Not difficult at all Not difficult at all   Hypertension: BP Readings from Last 3 Encounters:  04/29/19 106/76  09/14/18 104/70  08/10/18 107/62   Obesity: Wt Readings from Last 3 Encounters:  04/29/19 161 lb 9.6 oz (73.3 kg)  09/14/18 184 lb 11.2 oz (83.8 kg)  08/10/18 188 lb 1.6 oz (85.3 kg)   BMI Readings from Last 3 Encounters:  04/29/19 23.86 kg/m  09/14/18 27.28 kg/m  08/10/18 27.78 kg/m    Hep C Screening: Due for this today STD testing and prevention (HIV/chl/gon/syphilis): HIV negative 10/19; declines additional screenings today. Intimate partner violence: No concerns Sexual History/Pain during Intercourse:  No concerns Menstrual History/LMP/Abnormal Bleeding: She has IUD, but still has regular periods with heavier bleeding.  Incontinence Symptoms: No concerns   Advanced Care Planning: A voluntary discussion about advance care planning including the explanation and discussion of advance directives.  Discussed health care proxy and Living will, and the patient was able to identify a health care proxy  as Mom (June Hassell Done).  Patient does not have a living will at present time. If patient does have living will, I have requested they bring this to the clinic to be scanned in to their chart.  Breast cancer: No family history No results found for: HMMAMMO  BRCA gene screening: No family history Cervical cancer screening: Seeing Dr. Marcelline Mates next week for pap and breast examination  Osteoporosis Screening: No family history No results found for: HMDEXASCAN  Lipids: Due today for this. No results found for: CHOL No results found for: HDL No results found for: LDLCALC No results found for: TRIG No results found for: CHOLHDL No results found for: LDLDIRECT  Glucose:  Glucose, Bld  Date Value Ref Range Status  06/23/2017 163 (H) 65 - 99 mg/dL Final    Skin cancer: No concerning lesions Colorectal cancer: Denies family or personal history of colorectal cancer, no changes in BM's - no blood in stool, dark and tarry stool, mucus in stool, or constipation/diarrhea. Lung cancer:  Former smoker  Low Dose CT Chest recommended if Age 33-80 years, 30 pack-year currently smoking OR have quit w/in 15years. Patient does not qualify.   ECG: EKG on file; has palpitations only during panic attacks - has only had 1 since medication change a week ago; otherwise no palpitations, chest pain, or shortness of breath.   Patient Active Problem List   Diagnosis Date Noted  .  Panic attack 04/13/2019  . Cystic acne 01/01/2019  . IUD (intrauterine device) in place 01/01/2019  . Marijuana use, episodic 02/22/2018  . Depression 10/02/2015  . Human papilloma virus infection 10/02/2015    Past Surgical History:  Procedure Laterality Date  . NO PAST SURGERIES    . TOOTH EXTRACTION      Family History  Problem Relation Age of Onset  . Hypertension Mother   . COPD Father   . Depression Sister   . Depression Sister   . Bipolar disorder Maternal Grandmother   . Bladder Cancer Maternal Grandfather   . Lung  cancer Paternal Grandmother   . Alcoholism Paternal Grandfather   . Cirrhosis Paternal Grandfather   . Breast cancer Neg Hx   . Ovarian cancer Neg Hx   . Colon cancer Neg Hx   . Diabetes Neg Hx     Social History   Socioeconomic History  . Marital status: Divorced    Spouse name: Not on file  . Number of children: 2  . Years of education: 31  . Highest education level: Bachelor's degree (e.g., BA, AB, BS)  Occupational History  . Occupation: [employer]  Social Needs  . Financial resource strain: Not hard at all  . Food insecurity    Worry: Never true    Inability: Never true  . Transportation needs    Medical: No    Non-medical: No  Tobacco Use  . Smoking status: Former Smoker    Types: Cigarettes    Quit date: 06/16/2017    Years since quitting: 1.8  . Smokeless tobacco: Never Used  Substance and Sexual Activity  . Alcohol use: Yes    Alcohol/week: 2.0 - 3.0 standard drinks    Types: 2 - 3 Cans of beer per week    Frequency: Never    Comment: once a week  . Drug use: No    Comment: last used 03/2017  . Sexual activity: Yes    Partners: Male    Birth control/protection: I.U.D.    Comment: plans paraguard after delivery  Lifestyle  . Physical activity    Days per week: 0 days    Minutes per session: 0 min  . Stress: Not at all  Relationships  . Social Herbalist on phone: More than three times a week    Gets together: Never    Attends religious service: Never    Active member of club or organization: No    Attends meetings of clubs or organizations: Never    Relationship status: Divorced  . Intimate partner violence    Fear of current or ex partner: No    Emotionally abused: No    Physically abused: No    Forced sexual activity: No  Other Topics Concern  . Not on file  Social History Narrative  . Not on file     Current Outpatient Medications:  .  albuterol (PROVENTIL HFA;VENTOLIN HFA) 108 (90 Base) MCG/ACT inhaler, Inhale into the lungs  every 6 (six) hours as needed for wheezing or shortness of breath., Disp: , Rfl:  .  buPROPion (WELLBUTRIN XL) 150 MG 24 hr tablet, Take 1 tablet (150 mg total) by mouth daily., Disp: 90 tablet, Rfl: 0 .  busPIRone (BUSPAR) 7.5 MG tablet, Take 1 tablet twice daily.  May take 1 additional tablet if needed for anxiety., Disp: 210 tablet, Rfl: 1 .  Dapsone 7.5 % GEL, Apply 1 application topically daily., Disp: , Rfl:  .  hydrOXYzine (ATARAX/VISTARIL) 25 MG tablet, Take 1 tablet (25 mg total) by mouth 3 (three) times daily as needed for anxiety., Disp: 30 tablet, Rfl: 1 .  Multiple Vitamin (MULTIVITAMIN) tablet, Take 1 tablet by mouth daily., Disp: , Rfl:  .  spironolactone (ALDACTONE) 25 MG tablet, TK 1 T PO BID WITH WATER (Patient taking differently: Take 75 mg by mouth. TK 1 T PO BID WITH WATER), Disp: 180 tablet, Rfl: 1 .  vitamin B-12 (CYANOCOBALAMIN) 500 MCG tablet, Take 500 mcg by mouth daily., Disp: , Rfl:  .  vitamin C (ASCORBIC ACID) 500 MG tablet, Take 500 mg by mouth daily., Disp: , Rfl:  .  Prenatal Vit-Fe Fumarate-FA (MULTIVITAMIN-PRENATAL) 27-0.8 MG TABS tablet, Take 1 tablet by mouth daily at 12 noon., Disp: , Rfl:   No Known Allergies   ROS  Constitutional: Negative for fever or weight change.  Respiratory: Negative for cough and shortness of breath.   Cardiovascular: Negative for chest pain or palpitations.  Gastrointestinal: Negative for abdominal pain, no bowel changes.  Musculoskeletal: Negative for gait problem or joint swelling.  Skin: Negative for rash.  Neurological: Negative for dizziness or headache.  No other specific complaints in a complete review of systems (except as listed in HPI above).  Objective  Vitals:   04/29/19 0911  BP: 106/76  Pulse: 100  Resp: 16  Temp: 98.3 F (36.8 C)  TempSrc: Oral  SpO2: 98%  Weight: 161 lb 9.6 oz (73.3 kg)  Height: '5\' 9"'  (1.753 m)    Body mass index is 23.86 kg/m.  Physical Exam  Constitutional: Patient appears  well-developed and well-nourished. No distress.  HENT: Head: Normocephalic and atraumatic. Ears: B TMs ok, no erythema or effusion; Nose: Nose normal. Mouth/Throat: Oropharynx is clear and moist. No oropharyngeal exudate.  Eyes: Conjunctivae and EOM are normal. Pupils are equal, round, and reactive to light. No scleral icterus.  Neck: Normal range of motion. Neck supple. No JVD present. No thyromegaly present.  Cardiovascular: Normal rate, regular rhythm and normal heart sounds.  No murmur heard. No BLE edema. Pulmonary/Chest: Effort normal and breath sounds normal. No respiratory distress. Abdominal: Soft. Bowel sounds are normal, no distension. There is no tenderness. no masses Breast: Deferred per patient FEMALE GENITALIA: Deferred per patient Musculoskeletal: Normal range of motion, no joint effusions. No gross deformities Neurological: he is alert and oriented to person, place, and time. No cranial nerve deficit. Coordination, balance, strength, speech and gait are normal.  Skin: Skin is warm and dry. No rash noted. No erythema.  Psychiatric: Patient has a normal mood and affect. behavior is normal. Judgment and thought content normal.  No results found for this or any previous visit (from the past 2160 hour(s)).  PHQ2/9: Depression screen [facility] 2/9 04/29/2019 04/13/2019 01/01/2019  Decreased Interest 0 0 0  Down, Depressed, Hopeless 0 0 0  PHQ - 2 Score 0 0 0  Altered sleeping 1 1 0  Tired, decreased energy 0 0 0  Change in appetite 0 0 0  Feeling bad or failure about yourself  0 0 0  Trouble concentrating 0 0 0  Moving slowly or fidgety/restless 0 0 0  Suicidal thoughts 0 0 0  PHQ-9 Score 1 1 0  Difficult doing work/chores Not difficult at all Not difficult at all Not difficult at all   Fall Risk: Fall Risk  04/29/2019 04/13/2019 01/01/2019  Falls in the past year? 0 0 0  Number falls in past yr: 0 0 -  Injury  with Fall? 0 0 -  Follow up Falls evaluation completed Falls evaluation  completed -   Assessment & Plan 1. Well woman exam (no gynecological exam) -USPSTF grade A and B recommendations reviewed with patient; age-appropriate recommendations, preventive care, screening tests, etc discussed and encouraged; healthy living encouraged; see AVS for patient education given to patient -Discussed importance of 150 minutes of physical activity weekly, eat two servings of fish weekly, eat one serving of tree nuts ( cashews, pistachios, pecans, almonds.Marland Kitchen) every other day, eat 6 servings of fruit/vegetables daily and drink plenty of water and avoid sweet beverages.  - Lipid panel - TSH - COMPLETE METABOLIC PANEL WITH GFR - CBC with Differential/Platelet - Hepatitis C antibody  2. Panic attack - TSH - CBC with Differential/Platelet  3. Anxiety - TSH - CBC with Differential/Platelet  4. Lipid screening - Lipid panel  5. Need for hepatitis C screening test - Hepatitis C antibody  6. Diabetes mellitus screening - COMPLETE METABOLIC PANEL WITH GFR

## 2019-04-30 ENCOUNTER — Other Ambulatory Visit: Payer: Self-pay | Admitting: Family Medicine

## 2019-04-30 DIAGNOSIS — E039 Hypothyroidism, unspecified: Secondary | ICD-10-CM

## 2019-04-30 LAB — COMPLETE METABOLIC PANEL WITH GFR
AG Ratio: 1.8 (calc) (ref 1.0–2.5)
ALT: 9 U/L (ref 6–29)
AST: 13 U/L (ref 10–30)
Albumin: 4.7 g/dL (ref 3.6–5.1)
Alkaline phosphatase (APISO): 42 U/L (ref 31–125)
BUN: 15 mg/dL (ref 7–25)
CO2: 28 mmol/L (ref 20–32)
Calcium: 10.1 mg/dL (ref 8.6–10.2)
Chloride: 103 mmol/L (ref 98–110)
Creat: 0.98 mg/dL (ref 0.50–1.10)
GFR, Est African American: 88 mL/min/{1.73_m2} (ref >=60)
GFR, Est Non African American: 76 mL/min/{1.73_m2} (ref >=60)
Globulin: 2.6 g/dL (calc) (ref 1.9–3.7)
Glucose, Bld: 87 mg/dL (ref 65–99)
Potassium: 4.6 mmol/L (ref 3.5–5.3)
Sodium: 138 mmol/L (ref 135–146)
Total Bilirubin: 0.4 mg/dL (ref 0.2–1.2)
Total Protein: 7.3 g/dL (ref 6.1–8.1)

## 2019-04-30 LAB — CBC WITH DIFFERENTIAL/PLATELET
Absolute Monocytes: 586 cells/uL (ref 200–950)
Basophils Absolute: 38 cells/uL (ref 0–200)
Basophils Relative: 0.6 %
Eosinophils Absolute: 50 cells/uL (ref 15–500)
Eosinophils Relative: 0.8 %
HCT: 37.7 % (ref 35.0–45.0)
Hemoglobin: 12.6 g/dL (ref 11.7–15.5)
Lymphs Abs: 1972 cells/uL (ref 850–3900)
MCH: 29.6 pg (ref 27.0–33.0)
MCHC: 33.4 g/dL (ref 32.0–36.0)
MCV: 88.7 fL (ref 80.0–100.0)
MPV: 10.1 fL (ref 7.5–12.5)
Monocytes Relative: 9.3 %
Neutro Abs: 3654 cells/uL (ref 1500–7800)
Neutrophils Relative %: 58 %
Platelets: 321 10*3/uL (ref 140–400)
RBC: 4.25 10*6/uL (ref 3.80–5.10)
RDW: 13.1 % (ref 11.0–15.0)
Total Lymphocyte: 31.3 %
WBC: 6.3 10*3/uL (ref 3.8–10.8)

## 2019-04-30 LAB — LIPID PANEL
Cholesterol: 191 mg/dL (ref <200)
HDL: 48 mg/dL — ABNORMAL LOW (ref >=50)
LDL Cholesterol (Calc): 120 mg/dL (calc) — ABNORMAL HIGH
Non-HDL Cholesterol (Calc): 143 mg/dL (calc) — ABNORMAL HIGH (ref <130)
Total CHOL/HDL Ratio: 4 (calc) (ref <5.0)
Triglycerides: 115 mg/dL (ref <150)

## 2019-04-30 LAB — TSH: TSH: 11.07 mIU/L — ABNORMAL HIGH

## 2019-04-30 LAB — HEPATITIS C ANTIBODY
Hepatitis C Ab: NONREACTIVE
SIGNAL TO CUT-OFF: 0.04 (ref <1.00)

## 2019-04-30 MED ORDER — LEVOTHYROXINE SODIUM 25 MCG PO TABS: 25.0000 ug | ORAL_TABLET | Freq: Every day | ORAL | 1 refills | Status: DC

## 2019-05-04 ENCOUNTER — Encounter: Payer: Self-pay | Admitting: Obstetrics and Gynecology

## 2019-05-04 ENCOUNTER — Ambulatory Visit (INDEPENDENT_AMBULATORY_CARE_PROVIDER_SITE_OTHER): Payer: No Typology Code available for payment source | Admitting: Obstetrics and Gynecology

## 2019-05-04 ENCOUNTER — Other Ambulatory Visit: Payer: Self-pay

## 2019-05-04 VITALS — BP 118/76 | HR 66 | Ht 69.0 in | Wt 160.9 lb

## 2019-05-04 DIAGNOSIS — E039 Hypothyroidism, unspecified: Secondary | ICD-10-CM | POA: Diagnosis not present

## 2019-05-04 DIAGNOSIS — Z01419 Encounter for gynecological examination (general) (routine) without abnormal findings: Secondary | ICD-10-CM | POA: Diagnosis not present

## 2019-05-04 DIAGNOSIS — N920 Excessive and frequent menstruation with regular cycle: Secondary | ICD-10-CM

## 2019-05-04 DIAGNOSIS — Z30432 Encounter for removal of intrauterine contraceptive device: Secondary | ICD-10-CM

## 2019-05-04 DIAGNOSIS — F410 Panic disorder [episodic paroxysmal anxiety] without agoraphobia: Secondary | ICD-10-CM

## 2019-05-04 DIAGNOSIS — Z30011 Encounter for initial prescription of contraceptive pills: Secondary | ICD-10-CM

## 2019-05-04 MED ORDER — NORGESTIM-ETH ESTRAD TRIPHASIC 0.18/0.215/0.25 MG-25 MCG PO TABS: 1.0000 | ORAL_TABLET | Freq: Every day | ORAL | 4 refills | Status: DC

## 2019-05-04 NOTE — Patient Instructions (Addendum)
Preventive Care 21-33 Years Old, Female Preventive care refers to visits with your health care provider and lifestyle choices that can promote health and wellness. This includes:  A yearly physical exam. This may also be called an annual well check.  Regular dental visits and eye exams.  Immunizations.  Screening for certain conditions.  Healthy lifestyle choices, such as eating a healthy diet, getting regular exercise, not using drugs or products that contain nicotine and tobacco, and limiting alcohol use. What can I expect for my preventive care visit? Physical exam Your health care provider will check your:  Height and weight. This may be used to calculate body mass index (BMI), which tells if you are at a healthy weight.  Heart rate and blood pressure.  Skin for abnormal spots. Counseling Your health care provider may ask you questions about your:  Alcohol, tobacco, and drug use.  Emotional well-being.  Home and relationship well-being.  Sexual activity.  Eating habits.  Work and work environment.  Method of birth control.  Menstrual cycle.  Pregnancy history. What immunizations do I need?  Influenza (flu) vaccine  This is recommended every year. Tetanus, diphtheria, and pertussis (Tdap) vaccine  You may need a Td booster every 10 years. Varicella (chickenpox) vaccine  You may need this if you have not been vaccinated. Human papillomavirus (HPV) vaccine  If recommended by your health care provider, you may need three doses over 6 months. Measles, mumps, and rubella (MMR) vaccine  You may need at least one dose of MMR. You may also need a second dose. Meningococcal conjugate (MenACWY) vaccine  One dose is recommended if you are age 19-21 years and a first-year college student living in a residence hall, or if you have one of several medical conditions. You may also need additional booster doses. Pneumococcal conjugate (PCV13) vaccine  You may need  this if you have certain conditions and were not previously vaccinated. Pneumococcal polysaccharide (PPSV23) vaccine  You may need one or two doses if you smoke cigarettes or if you have certain conditions. Hepatitis A vaccine  You may need this if you have certain conditions or if you travel or work in places where you may be exposed to hepatitis A. Hepatitis B vaccine  You may need this if you have certain conditions or if you travel or work in places where you may be exposed to hepatitis B. Haemophilus influenzae type b (Hib) vaccine  You may need this if you have certain conditions. You may receive vaccines as individual doses or as more than one vaccine together in one shot (combination vaccines). Talk with your health care provider about the risks and benefits of combination vaccines. What tests do I need?  Blood tests  Lipid and cholesterol levels. These may be checked every 5 years starting at age 20.  Hepatitis C test.  Hepatitis B test. Screening  Diabetes screening. This is done by checking your blood sugar (glucose) after you have not eaten for a while (fasting).  Sexually transmitted disease (STD) testing.  BRCA-related cancer screening. This may be done if you have a family history of breast, ovarian, tubal, or peritoneal cancers.  Pelvic exam and Pap test. This may be done every 3 years starting at age 21. Starting at age 30, this may be done every 5 years if you have a Pap test in combination with an HPV test. Talk with your health care provider about your test results, treatment options, and if necessary, the need for more tests.   Follow these instructions at home: Eating and drinking   Eat a diet that includes fresh fruits and vegetables, whole grains, lean protein, and low-fat dairy.  Take vitamin and mineral supplements as recommended by your health care provider.  Do not drink alcohol if: ? Your health care provider tells you not to drink. ? You are  pregnant, may be pregnant, or are planning to become pregnant.  If you drink alcohol: ? Limit how much you have to 0-1 drink a day. ? Be aware of how much alcohol is in your drink. In the [place] one drink equals one 12 oz bottle of beer (355 mL), one 5 oz glass of wine (148 mL), or one 1 oz glass of hard liquor (44 mL). Lifestyle  Take daily care of your teeth and gums.  Stay active. Exercise for at least 30 minutes on 5 or more days each week.  Do not use any products that contain nicotine or tobacco, such as cigarettes, e-cigarettes, and chewing tobacco. If you need help quitting, ask your health care provider.  If you are sexually active, practice safe sex. Use a condom or other form of birth control (contraception) in order to prevent pregnancy and STIs (sexually transmitted infections). If you plan to become pregnant, see your health care provider for a preconception visit. What's next?  Visit your health care provider once a year for a well check visit.  Ask your health care provider how often you should have your eyes and teeth checked.  Stay up to date on all vaccines. This information is not intended to replace advice given to you by your health care provider. Make sure you discuss any questions you have with your health care provider. Document Released: 10/29/2001 Document Revised: 05/14/2018 Document Reviewed: 05/14/2018 Elsevier Patient Education  2020 [place] self-awareness is knowing how your breasts look and feel. Doing breast self-awareness is important. It allows you to catch a breast problem early while it is still small and can be treated. All women should do breast self-awareness, including women who have had breast implants. Tell your doctor if you notice a change in your breasts. What you need:  A mirror.  A well-lit room. How to do a breast self-exam A breast self-exam is one way to learn what is normal for your breasts and to  check for changes. To do a breast self-exam: Look for changes  1. Take off all the clothes above your waist. 2. Stand in front of a mirror in a room with good lighting. 3. Put your hands on your hips. 4. Push your hands down. 5. Look at your breasts and nipples in the mirror to see if one breast or nipple looks different from the other. Check to see if: ? The shape of one breast is different. ? The size of one breast is different. ? There are wrinkles, dips, and bumps in one breast and not the other. 6. Look at each breast for changes in the skin, such as: ? Redness. ? Scaly areas. 7. Look for changes in your nipples, such as: ? Liquid around the nipples. ? Bleeding. ? Dimpling. ? Redness. ? A change in where the nipples are. Feel for changes  1. Lie on your back on the floor. 2. Feel each breast. To do this, follow these steps: ? Pick a breast to feel. ? Put the arm closest to that breast above your head. ? Use your other arm to feel the nipple area of  your breast. Feel the area with the pads of your three middle fingers by making small circles with your fingers. For the first circle, press lightly. For the second circle, press harder. For the third circle, press even harder. ? Keep making circles with your fingers at the different pressures as you move down your breast. Stop when you feel your ribs. ? Move your fingers a little toward the center of your body. ? Start making circles with your fingers again, this time going up until you reach your collarbone. ? Keep making up-and-down circles until you reach your armpit. Remember to keep using the three pressures. ? Feel the other breast in the same way. 3. Sit or stand in the tub or shower. 4. With soapy water on your skin, feel each breast the same way you did in step 2 when you were lying on the floor. Write down what you find Writing down what you find can help you remember what to tell your doctor. Write down:  What is  normal for each breast.  Any changes you find in each breast, including: ? The kind of changes you find. ? Whether you have pain. ? Size and location of any lumps.  When you last had your menstrual period. General tips  Check your breasts every month.  If you are breastfeeding, the best time to check your breasts is after you feed your baby or after you use a breast pump.  If you get menstrual periods, the best time to check your breasts is 5-7 days after your menstrual period is over.  With time, you will become comfortable with the self-exam, and you will begin to know if there are changes in your breasts. Contact a doctor if you:  See a change in the shape or size of your breasts or nipples.  See a change in the skin of your breast or nipples, such as red or scaly skin.  Have fluid coming from your nipples that is not normal.  Find a lump or thick area that was not there before.  Have pain in your breasts.  Have any concerns about your breast health. Summary  Breast self-awareness includes looking for changes in your breasts, as well as feeling for changes within your breasts.  Breast self-awareness should be done in front of a mirror in a well-lit room.  You should check your breasts every month. If you get menstrual periods, the best time to check your breasts is 5-7 days after your menstrual period is over.  Let your doctor know of any changes you see in your breasts, including changes in size, changes on the skin, pain or tenderness, or fluid from your nipples that is not normal. This information is not intended to replace advice given to you by your health care provider. Make sure you discuss any questions you have with your health care provider. Document Released: 02/19/2008 Document Revised: 04/21/2018 Document Reviewed: 04/21/2018 Elsevier Patient Education  2020 [employer].    Oral Contraception Information Oral contraceptive pills (OCPs) are medicines  taken to prevent pregnancy. OCPs are taken by mouth, and they work by:  Preventing the ovaries from releasing eggs.  Thickening mucus in the lower part of the uterus (cervix), which prevents sperm from entering the uterus.  Thinning the lining of the uterus (endometrium), which prevents a fertilized egg from attaching to the endometrium. OCPs are highly effective when taken exactly as prescribed. However, OCPs do not prevent STIs (sexually transmitted infections). Safe sex practices,  such as using condoms while on an OCP, can help prevent STIs. Before starting OCPs Before you start taking OCPs, you may have a physical exam, blood test, and Pap test. However, you are not required to have a pelvic exam in order to be prescribed OCPs. Your health care provider will make sure you are a good candidate for oral contraception. OCPs are not a good option for certain women, including women who smoke and are older than 35 years, and women with a medical history of high blood pressure, deep vein thrombosis, pulmonary embolism, stroke, cardiovascular disease, or peripheral vascular disease. Discuss with your health care provider the possible side effects of the OCP you may be prescribed. When you start an OCP, be aware that it can take 2-3 months for your body to adjust to changes in hormone levels. Follow instructions from your health care provider about how to start taking your first cycle of OCPs. Depending on when you start the pill, you may need to use a backup form of birth control, such as condoms, during the first week. Make sure you know what steps to take if you ever forget to take the pill. Types of oral contraception  The most common types of birth control pills contain the hormones estrogen and progestin (synthetic progesterone) or progestin only. The combination pill This type of pill contains estrogen and progestin hormones. Combination pills often come in packs of 21, 28, or 91 pills. For each  pack, the last 7 pills may not contain hormones, which means you may stop taking the pills for 7 days. Menstrual bleeding occurs during the week that you do not take the pills or that you take the pills with no hormones in them. The minipill This type of pill contains the progestin hormone only. It comes in packs of 28 pills. All 28 pills contain the hormone. You take the pill every day. It is very important to take the pill at the same time each day. Advantages of oral contraceptive pills  Provides reliable and continuous contraception if taken as instructed.  May treat or decrease symptoms of: ? Menstrual period cramps. ? Irregular menstrual cycle or bleeding. ? Heavy menstrual flow. ? Abnormal uterine bleeding. ? Acne, depending on the type of pill. ? Polycystic ovarian syndrome. ? Endometriosis. ? Iron deficiency anemia. ? Premenstrual symptoms, including premenstrual dysphoric disorder.  May reduce the risk of endometrial and ovarian cancer.  Can be used as emergency contraception.  Prevents mislocated (ectopic) pregnancies and infections of the fallopian tubes. Things that can make oral contraceptive pills less effective OCPs can be less effective if:  You forget to take the pill at the same time every day. This is especially important when taking the minipill.  You have a stomach or intestinal disease that reduces your body's ability to absorb the pill.  You take OCPs with other medicines that make OCPs less effective, such as antibiotics, certain HIV medicines, and some seizure medicines.  You take expired OCPs.  You forget to restart the pill on day 7, if using the packs of 21 pills. Risks associated with oral contraceptive pills Oral contraceptive pills can sometimes cause side effects, such as:  Headache.  Depression.  Trouble sleeping.  Nausea and vomiting.  Breast tenderness.  Irregular bleeding or spotting during the first several months.  Bloating or  fluid retention.  Increase in blood pressure. Combination pills are also associated with a small increase in the risk of:  Blood clots.  Heart attack.  Stroke. Summary  Oral contraceptive pills are medicines taken by mouth to prevent pregnancy. They are highly effective when taken exactly as prescribed.  The most common types of birth control pills contain the hormones estrogen and progestin (synthetic progesterone) or progestin only.  Before you start taking the pill, you may have a physical exam, blood test, and Pap test. Your health care provider will make sure you are a good candidate for oral contraception.  The combination pill may come in a 21-day pack, a 28-day pack, or a 91-day pack. The minipill contains the progesterone hormone only and comes in packs of 28 pills.  Oral contraceptive pills can sometimes cause side effects, such as headache, nausea, breast tenderness, or irregular bleeding. This information is not intended to replace advice given to you by your health care provider. Make sure you discuss any questions you have with your health care provider. Document Released: 11/23/2002 Document Revised: 08/15/2017 Document Reviewed: 11/26/2016 Elsevier Patient Education  2020 [employer].

## 2019-05-04 NOTE — Progress Notes (Signed)
GYNECOLOGY ANNUAL PHYSICAL EXAM PROGRESS NOTE  Subjective:    Jean Foster is a 33 y.o. [id number] female who presents for an annual exam. The patient has no complaints today. The patient is sexually active.  The patient wears seatbelts: yes. The patient participates in regular exercise: no. Has the patient ever been transfused or tattooed?: no. The patient reports that there is not domestic violence in her life.   Patient reports that she would like her ParaGard IUD out today. States that she has been having prolonged heavy cycles with use.  Also notes that she is dealing with anxiety and panic attacks and mood changes which she has never had before. Notes she has been seeing her PCP for this, who has initiated her anxiety medications but she has not quite gotten this under control. Also recently diagnosed with hypothyroidism, recently started on medications. Patient believes that some of her symptoms may also be due to not being on hormonal birth control.    Menstrual History: Patient's last menstrual period was 04/28/2019.  Menses occurs monthly, lasts for 8-9 days, with heaviest flow on Day 3-4, requiring use of super tampons/pads, changing q 2-3 hrs.    Gynecologic History Menarche age: 52 Contraception: IUD - ParaGard History of STI's: Denies Last Pap: 09/14/2018. Results were: normal.  Denies h/o abnormal pap smears.    OB History  Gravida Para Term Preterm AB Living  2 2 2  0 0 2  SAB TAB Ectopic Multiple Live Births  0 0 0 0 2    # Outcome Date GA Lbr Len/2nd Weight Sex Delivery Anes PTL Lv  2 Term 06/29/18 [redacted]w[redacted]d  6 lb 2.4 oz (2.79 kg) M Vag-Spont None  LIV     Name: Telleria,BOY Tanashia     Apgar1: 8  Apgar5: 9  1 Term 2013   8 lb 8 oz (3.856 kg) M Vag-Spont   LIV    Past Medical History:  Diagnosis Date  . Acne   . Anxiety   . Asthma   . Depression   . Thyroid disease     Past Surgical History:  Procedure Laterality Date  . NO PAST SURGERIES    . TOOTH  EXTRACTION      Family History  Problem Relation Age of Onset  . Hypertension Mother   . COPD Father   . Depression Sister   . Depression Sister   . Bipolar disorder Maternal Grandmother   . Bladder Cancer Maternal Grandfather   . Lung cancer Paternal Grandmother   . Alcoholism Paternal Grandfather   . Cirrhosis Paternal Grandfather   . Breast cancer Neg Hx   . Ovarian cancer Neg Hx   . Colon cancer Neg Hx   . Diabetes Neg Hx     Social History   Socioeconomic History  . Marital status: Divorced    Spouse name: Not on file  . Number of children: 2  . Years of education: 22  . Highest education level: Bachelor's degree (e.g., BA, AB, BS)  Occupational History  . Occupation: [employer]  Social Needs  . Financial resource strain: Not hard at all  . Food insecurity    Worry: Never true    Inability: Never true  . Transportation needs    Medical: No    Non-medical: No  Tobacco Use  . Smoking status: Former Smoker    Packs/day: 0.25    Years: 12.00    Pack years: 3.00    Types: Cigarettes  Quit date: 06/16/2017    Years since quitting: 1.8  . Smokeless tobacco: Never Used  Substance and Sexual Activity  . Alcohol use: Yes    Alcohol/week: 2.0 - 3.0 standard drinks    Types: 2 - 3 Cans of beer per week    Frequency: Never    Comment: once a week  . Drug use: No    Comment: last used 03/2017  . Sexual activity: Yes    Partners: Male    Birth control/protection: I.U.D.    Comment: plans paraguard after delivery  Lifestyle  . Physical activity    Days per week: 0 days    Minutes per session: 0 min  . Stress: Not at all  Relationships  . Social Herbalist on phone: More than three times a week    Gets together: Never    Attends religious service: Never    Active member of club or organization: No    Attends meetings of clubs or organizations: Never    Relationship status: Divorced  . Intimate partner violence    Fear of current or ex partner:  No    Emotionally abused: No    Physically abused: No    Forced sexual activity: No  Other Topics Concern  . Not on file  Social History Narrative  . Not on file    Current Outpatient Medications on File Prior to Visit  Medication Sig Dispense Refill  . albuterol (PROVENTIL HFA;VENTOLIN HFA) 108 (90 Base) MCG/ACT inhaler Inhale into the lungs every 6 (six) hours as needed for wheezing or shortness of breath.    Marland Kitchen buPROPion (WELLBUTRIN XL) 150 MG 24 hr tablet Take 1 tablet (150 mg total) by mouth daily. 90 tablet 0  . busPIRone (BUSPAR) 7.5 MG tablet Take 1 tablet twice daily.  May take 1 additional tablet if needed for anxiety. 210 tablet 1  . Dapsone 7.5 % GEL Apply 1 application topically daily.    . hydrOXYzine (ATARAX/VISTARIL) 25 MG tablet Take 1 tablet (25 mg total) by mouth 3 (three) times daily as needed for anxiety. 30 tablet 1  . levothyroxine (SYNTHROID) 25 MCG tablet Take 1 tablet (25 mcg total) by mouth daily before breakfast. 90 tablet 1  . Multiple Vitamin (MULTIVITAMIN) tablet Take 1 tablet by mouth daily.    Marland Kitchen spironolactone (ALDACTONE) 25 MG tablet TK 1 T PO BID WITH WATER (Patient taking differently: Take 75 mg by mouth. TK 1 T PO BID WITH WATER) 180 tablet 1  . vitamin B-12 (CYANOCOBALAMIN) 500 MCG tablet Take 500 mcg by mouth daily.    . vitamin C (ASCORBIC ACID) 500 MG tablet Take 500 mg by mouth daily.     No current facility-administered medications on file prior to visit.     No Known Allergies    Review of Systems Constitutional: negative for chills, fatigue, fevers and sweats Eyes: negative for irritation, redness and visual disturbance Ears, nose, mouth, throat, and face: negative for hearing loss, nasal congestion, snoring and tinnitus Respiratory: negative for asthma, cough, sputum Cardiovascular: negative for chest pain, dyspnea, exertional chest pressure/discomfort, irregular heart beat, palpitations and syncope Gastrointestinal: negative for  abdominal pain, change in bowel habits, nausea and vomiting Genitourinary: positive for heavy prolonged abnormal menstrual periods.  Negative for genital lesions, sexual problems and vaginal discharge, dysuria and urinary incontinence Integument/breast: negative for breast lump, breast tenderness and nipple discharge Hematologic/lymphatic: negative for bleeding and easy bruising Musculoskeletal:negative for back pain and muscle weakness Neurological:  negative for dizziness, headaches, vertigo and weakness Endocrine: negative for diabetic symptoms including polydipsia, polyuria and skin dryness Allergic/Immunologic: negative for hay fever and urticaria    Psychologic: + for anxiety, panic attacks, occasional mood swings. Negative for depression, hallucinations.      Objective:  Blood pressure 118/76, pulse 66, height 5\' 9"  (1.753 m), weight 160 lb 14.4 oz (73 kg), last menstrual period 04/28/2019, unknown if currently breastfeeding. Body mass index is 23.76 kg/m.  General Appearance:    Alert, cooperative, no distress, appears stated age  Head:    Normocephalic, without obvious abnormality, atraumatic  Eyes:    PERRL, conjunctiva/corneas clear, EOM's intact, both eyes  Ears:    Normal external ear canals, both ears  Nose:   Nares normal, septum midline, mucosa normal, no drainage or sinus tenderness  Throat:   Lips, mucosa, and tongue normal; teeth and gums normal  Neck:   Supple, symmetrical, trachea midline, no adenopathy; thyroid: no enlargement/tenderness/nodules; no carotid bruit or JVD  Back:     Symmetric, no curvature, ROM normal, no CVA tenderness  Lungs:     Clear to auscultation bilaterally, respirations unlabored  Chest Wall:    No tenderness or deformity   Heart:    Regular rate and rhythm, S1 and S2 normal, no murmur, rub or gallop  Breast Exam:    No tenderness, masses, or nipple abnormality  Abdomen:     Soft, non-tender, bowel sounds active all four quadrants, no masses,  no organomegaly.    Genitalia:    Pelvic:external genitalia normal, vagina without lesions, discharge, or tenderness, rectovaginal septum  normal. Cervix normal in appearance, no cervical motion tenderness, no adnexal masses or tenderness. IUD threads visualized, 3 cm in length.  Uterus normal size, shape, mobile, regular contours, nontender.  Rectal:    Normal external sphincter.  No hemorrhoids appreciated. Internal exam not done.   Extremities:   Extremities normal, atraumatic, no cyanosis or edema  Pulses:   2+ and symmetric all extremities  Skin:   Skin color, texture, turgor normal, no rashes or lesions  Lymph nodes:   Cervical, supraclavicular, and axillary nodes normal  Neurologic:   CNII-XII intact, normal strength, sensation and reflexes throughout   .  Labs:  Lab Results  Component Value Date   WBC 6.3 04/29/2019   HGB 12.6 04/29/2019   HCT 37.7 04/29/2019   MCV 88.7 04/29/2019   PLT 321 04/29/2019    Lab Results  Component Value Date   CREATININE 0.98 04/29/2019   BUN 15 04/29/2019   NA 138 04/29/2019   K 4.6 04/29/2019   CL 103 04/29/2019   CO2 28 04/29/2019    Lab Results  Component Value Date   ALT 9 04/29/2019   AST 13 04/29/2019   BILITOT 0.4 04/29/2019    Lab Results  Component Value Date   TSH 11.07 (H) 04/29/2019     Assessment:   Encounter for well woman exam with routine gynecological exam  Encounter for IUD removal Menorrhagia with regular cycle Acquired hypothyroidism  Panic anxiety syndrome  Contraception management  Plan:     Blood tests: None ordered. Patient had labs by PCP last week. Breast self exam technique reviewed and patient encouraged to perform self-exam monthly. Contraception: Desires ParaGard IIUD removal today due to symptoms of menorrhagia and anxiety/mood changes. Would like to resume combined OCPs. Used Ortho Tri-cyclen Lo in the past. Will renew prescription. Can start today.  Discussed healthy lifestyle  modifications. Pap smear up to  date.   Hypothyroidism and panic disorder managed by PCP.  Has completed Gardasil vaccine series RTC in 1 year for annual exam.    Rubie Maid, MD [facility]

## 2019-05-04 NOTE — Progress Notes (Signed)
Pt is present for annual exam. Pt stated that she no longer wants her IUD and would like to have it removed today and start on birth control pills.

## 2019-05-13 MED FILL — buPROPion HCL ER (XL) 150 M: 150 | 30 days supply | Qty: 30 | Fill #0

## 2019-05-13 MED FILL — buPROPion HCL ER (XL) 300 M: 300 | 30 days supply | Qty: 30 | Fill #0

## 2019-05-14 MED FILL — ALBUTEROL SULFATE HFA 108 (: 108 (90 BAS | 30 days supply | Qty: 9 | Fill #0

## 2019-05-26 ENCOUNTER — Encounter: Payer: Self-pay | Admitting: Family Medicine

## 2019-05-26 DIAGNOSIS — F3341 Major depressive disorder, recurrent, in partial remission: Secondary | ICD-10-CM

## 2019-05-26 DIAGNOSIS — F410 Panic disorder [episodic paroxysmal anxiety] without agoraphobia: Secondary | ICD-10-CM

## 2019-05-26 MED ORDER — HYDROXYZINE HCL 25 MG PO TABS: 25.0000 mg | ORAL_TABLET | Freq: Three times a day (TID) | ORAL | 1 refills | Status: DC | PRN

## 2019-05-28 ENCOUNTER — Ambulatory Visit: Payer: No Typology Code available for payment source | Admitting: Family Medicine

## 2019-06-10 ENCOUNTER — Telehealth: Payer: Self-pay | Admitting: Family Medicine

## 2019-06-10 NOTE — Telephone Encounter (Signed)
-----   Message from Hubbard Hartshorn, [place] sent at 04/30/2019  3:27 PM EDT ----- Regarding: Call patient to come for TSH recheck Call patient to come for TSH recheck

## 2019-06-15 NOTE — Telephone Encounter (Signed)
Left a message and sent a MyChart message for patient to come back in and have her TSH recheck. Labs are upfront and ready for her to have done.

## 2019-06-21 LAB — TSH: TSH: 4.26 mIU/L

## 2019-07-05 ENCOUNTER — Other Ambulatory Visit: Payer: Self-pay

## 2019-07-05 ENCOUNTER — Encounter: Payer: Self-pay | Admitting: Family Medicine

## 2019-07-05 ENCOUNTER — Ambulatory Visit (INDEPENDENT_AMBULATORY_CARE_PROVIDER_SITE_OTHER): Payer: No Typology Code available for payment source | Admitting: Family Medicine

## 2019-07-05 VITALS — BP 110/68 | HR 92 | Temp 97.3°F | Resp 14 | Ht 69.0 in | Wt 161.4 lb

## 2019-07-05 DIAGNOSIS — L700 Acne vulgaris: Secondary | ICD-10-CM

## 2019-07-05 DIAGNOSIS — F410 Panic disorder [episodic paroxysmal anxiety] without agoraphobia: Secondary | ICD-10-CM | POA: Diagnosis not present

## 2019-07-05 DIAGNOSIS — E039 Hypothyroidism, unspecified: Secondary | ICD-10-CM | POA: Diagnosis not present

## 2019-07-05 DIAGNOSIS — F331 Major depressive disorder, recurrent, moderate: Secondary | ICD-10-CM

## 2019-07-05 DIAGNOSIS — F325 Major depressive disorder, single episode, in full remission: Secondary | ICD-10-CM

## 2019-07-05 HISTORY — DX: Major depressive disorder, single episode, in full remission: F32.5

## 2019-07-05 MED ORDER — BUSPIRONE HCL 15 MG PO TABS: 15.0000 mg | ORAL_TABLET | Freq: Two times a day (BID) | ORAL | 1 refills | Status: DC

## 2019-07-05 MED ORDER — SPIRONOLACTONE 25 MG PO TABS: 75.0000 mg | ORAL_TABLET | Freq: Once | ORAL | Status: DC

## 2019-07-05 NOTE — Progress Notes (Signed)
Name: Jean Foster   MRN: [id number]    DOB: Jan 11, 1986   Date:07/05/2019       Progress Note  Subjective  Chief Complaint  Chief Complaint  Patient presents with  . Follow-up  . Medication Refill    HPI  Pt presents to follow up on anxiety and depression.  She feels that she is doing better.  She is still taking hydroxzyzine about twice a day; on buspar BID - will take additional dose of either buspar or hydroxyzine if having a panic attack.  Has only had 1 panic attack since our last visit.  Her energy levels have improved since her thyroid levels have improved.  Still having some palpitations.     Office Visit from 07/05/2019 in [facility]  PHQ-9 Total Score  4     GAD 7 : Generalized Anxiety Score 07/05/2019 04/13/2019 01/01/2019  Nervous, Anxious, on Edge 0 2 0  Control/stop worrying 2 2 0  Worry too much - different things 3 3 0  Trouble relaxing 0 3 0  Restless 0 0 0  Easily annoyed or irritable 0 0 0  Afraid - awful might happen 0 3 0  Total GAD 7 Score 5 13 0  Anxiety Difficulty Somewhat difficult Very difficult Not difficult at all   Hypothyroid - recent labs showed normal TSH; taking 38mcg daily and feeling like her energy has really improved.  Still having some palpitations 1-2 times a week for a few minutes and normally only when she is anxious.   Patient Active Problem List   Diagnosis Date Noted  . Acquired hypothyroidism 04/30/2019  . Panic attack 04/13/2019  . Cystic acne 01/01/2019  . Depression 10/02/2015  . Human papilloma virus infection 10/02/2015    Past Surgical History:  Procedure Laterality Date  . NO PAST SURGERIES    . TOOTH EXTRACTION      Family History  Problem Relation Age of Onset  . Hypertension Mother   . COPD Father   . Depression Sister   . Depression Sister   . Bipolar disorder Maternal Grandmother   . Bladder Cancer Maternal Grandfather   . Lung cancer Paternal Grandmother   . Alcoholism Paternal  Grandfather   . Cirrhosis Paternal Grandfather   . Breast cancer Neg Hx   . Ovarian cancer Neg Hx   . Colon cancer Neg Hx   . Diabetes Neg Hx     Social History   Socioeconomic History  . Marital status: Divorced    Spouse name: Not on file  . Number of children: 2  . Years of education: 54  . Highest education level: Bachelor's degree (e.g., BA, AB, BS)  Occupational History  . Occupation: [employer]  Social Needs  . Financial resource strain: Not hard at all  . Food insecurity    Worry: Never true    Inability: Never true  . Transportation needs    Medical: No    Non-medical: No  Tobacco Use  . Smoking status: Former Smoker    Packs/day: 0.25    Years: 12.00    Pack years: 3.00    Types: Cigarettes    Quit date: 06/16/2017    Years since quitting: 2.0  . Smokeless tobacco: Never Used  Substance and Sexual Activity  . Alcohol use: Yes    Alcohol/week: 2.0 - 3.0 standard drinks    Types: 2 - 3 Cans of beer per week    Frequency: Never    Comment:  once a week  . Drug use: No    Comment: last used 03/2017  . Sexual activity: Yes    Partners: Male    Birth control/protection: I.U.D.    Comment: plans paraguard after delivery  Lifestyle  . Physical activity    Days per week: 0 days    Minutes per session: 0 min  . Stress: Not at all  Relationships  . Social Herbalist on phone: More than three times a week    Gets together: Never    Attends religious service: Never    Active member of club or organization: No    Attends meetings of clubs or organizations: Never    Relationship status: Divorced  . Intimate partner violence    Fear of current or ex partner: No    Emotionally abused: No    Physically abused: No    Forced sexual activity: No  Other Topics Concern  . Not on file  Social History Narrative  . Not on file     Current Outpatient Medications:  .  albuterol (PROVENTIL HFA;VENTOLIN HFA) 108 (90 Base) MCG/ACT inhaler, Inhale into the  lungs every 6 (six) hours as needed for wheezing or shortness of breath., Disp: , Rfl:  .  buPROPion (WELLBUTRIN XL) 150 MG 24 hr tablet, Take 1 tablet (150 mg total) by mouth daily., Disp: 90 tablet, Rfl: 0 .  Dapsone 7.5 % GEL, Apply 1 application topically daily., Disp: , Rfl:  .  hydrOXYzine (ATARAX/VISTARIL) 25 MG tablet, Take 1 tablet (25 mg total) by mouth 3 (three) times daily as needed for anxiety., Disp: 90 tablet, Rfl: 1 .  levothyroxine (SYNTHROID) 25 MCG tablet, Take 1 tablet (25 mcg total) by mouth daily before breakfast., Disp: 90 tablet, Rfl: 1 .  Multiple Vitamin (MULTIVITAMIN) tablet, Take 1 tablet by mouth daily., Disp: , Rfl:  .  Norgestimate-Ethinyl Estradiol Triphasic 0.18/0.215/0.25 MG-25 MCG tab, Take 1 tablet by mouth daily., Disp: 3 Package, Rfl: 4 .  spironolactone (ALDACTONE) 25 MG tablet, Take 3 tablets (75 mg total) by mouth once for 1 dose. TK 1 T PO BID WITH WATER, Disp: , Rfl:  .  vitamin B-12 (CYANOCOBALAMIN) 500 MCG tablet, Take 500 mcg by mouth daily., Disp: , Rfl:  .  vitamin C (ASCORBIC ACID) 500 MG tablet, Take 500 mg by mouth daily., Disp: , Rfl:   No Known Allergies  I personally reviewed active problem list, medication list, allergies, notes from last encounter, lab results with the patient/caregiver today.   ROS  Constitutional: Negative for fever or weight change.  Respiratory: Negative for cough and shortness of breath.   Cardiovascular: Negative for chest pain or palpitations.  Gastrointestinal: Negative for abdominal pain, no bowel changes.  Musculoskeletal: Negative for gait problem or joint swelling.  Skin: Negative for rash.  Neurological: Negative for dizziness or headache.  No other specific complaints in a complete review of systems (except as listed in HPI above).  Objective  Vitals:   07/05/19 1410  BP: 110/68  Pulse: 92  Resp: 14  Temp: (!) 97.3 F (36.3 C)  TempSrc: Oral  SpO2: 95%  Weight: 161 lb 6.4 oz (73.2 kg)   Height: 5\' 9"  (1.753 m)    Body mass index is 23.83 kg/m.  Physical Exam  Constitutional: Patient appears well-developed and well-nourished. No distress.  HENT: Head: Normocephalic and atraumatic.  Eyes: Conjunctivae and EOM are normal. No scleral icterus.  Neck: Normal range of motion. Neck supple. No  JVD present.  Cardiovascular: Normal rate, regular rhythm and normal heart sounds.  No murmur heard. No BLE edema. Pulmonary/Chest: Effort normal and breath sounds normal. No respiratory distress. Musculoskeletal: Normal range of motion, no joint effusions. No gross deformities Neurological: Pt is alert and oriented to person, place, and time. No cranial nerve deficit. Coordination, balance, strength, speech and gait are normal.  Skin: Skin is warm and dry. No rash noted. No erythema.  Psychiatric: Patient has a normal mood and affect. behavior is normal. Judgment and thought content normal.  No results found for this or any previous visit (from the past 72 hour(s)).  PHQ2/9: Depression screen [facility] 2/9 07/05/2019 04/29/2019 04/13/2019 01/01/2019  Decreased Interest 0 0 0 0  Down, Depressed, Hopeless 1 0 0 0  PHQ - 2 Score 1 0 0 0  Altered sleeping 3 1 1  0  Tired, decreased energy 0 0 0 0  Change in appetite 0 0 0 0  Feeling bad or failure about yourself  0 0 0 0  Trouble concentrating 0 0 0 0  Moving slowly or fidgety/restless 0 0 0 0  Suicidal thoughts 0 0 0 0  PHQ-9 Score 4 1 1  0  Difficult doing work/chores - Not difficult at all Not difficult at all Not difficult at all   PHQ-2/9 Result is positive.    Fall Risk: Fall Risk  07/05/2019 04/29/2019 04/13/2019 01/01/2019  Falls in the past year? 0 0 0 0  Number falls in past yr: 0 0 0 -  Injury with Fall? 0 0 0 -  Follow up Falls evaluation completed Falls evaluation completed Falls evaluation completed -    Assessment & Plan  1. Panic attack - She is improving, however she is still having to take BID hydroxyzine on top of  her 7.5mg  buspar.  We will increase buspar dosing and decrease hydroxyzine to PRN dosing.  Overall she is improving; GAD7 score has greatly improved. - busPIRone (BUSPAR) 15 MG tablet; Take 1 tablet (15 mg total) by mouth 2 (two) times daily.  Dispense: 180 tablet; Refill: 1  2. Moderate episode of recurrent major depressive disorder ([place]) - Energy levels are adequate on lower dose of wellbutrin. - busPIRone (BUSPAR) 15 MG tablet; Take 1 tablet (15 mg total) by mouth 2 (two) times daily.  Dispense: 180 tablet; Refill: 1  3. Acquired hypothyroidism - Doing much better now that she is euthyroid.  Energy improving.  Plan to recheck at follow up.

## 2019-07-12 ENCOUNTER — Encounter: Payer: Self-pay | Admitting: Family Medicine

## 2019-07-12 MED ORDER — BUSPIRONE HCL 10 MG PO TABS: 10.0000 mg | ORAL_TABLET | Freq: Two times a day (BID) | ORAL | 1 refills | Status: DC

## 2019-07-30 ENCOUNTER — Other Ambulatory Visit: Payer: Self-pay | Admitting: Family Medicine

## 2019-07-30 DIAGNOSIS — F3341 Major depressive disorder, recurrent, in partial remission: Secondary | ICD-10-CM

## 2019-07-30 DIAGNOSIS — F410 Panic disorder [episodic paroxysmal anxiety] without agoraphobia: Secondary | ICD-10-CM

## 2019-09-13 ENCOUNTER — Other Ambulatory Visit: Payer: Self-pay | Admitting: Family Medicine

## 2019-09-13 ENCOUNTER — Ambulatory Visit (INDEPENDENT_AMBULATORY_CARE_PROVIDER_SITE_OTHER): Payer: No Typology Code available for payment source | Admitting: Family Medicine

## 2019-09-13 ENCOUNTER — Encounter: Payer: Self-pay | Admitting: Family Medicine

## 2019-09-13 ENCOUNTER — Other Ambulatory Visit: Payer: Self-pay

## 2019-09-13 VITALS — BP 112/70 | HR 66 | Temp 97.1°F | Resp 16 | Ht 69.0 in | Wt 165.0 lb

## 2019-09-13 DIAGNOSIS — Z5181 Encounter for therapeutic drug level monitoring: Secondary | ICD-10-CM | POA: Diagnosis not present

## 2019-09-13 DIAGNOSIS — F3341 Major depressive disorder, recurrent, in partial remission: Secondary | ICD-10-CM

## 2019-09-13 DIAGNOSIS — F410 Panic disorder [episodic paroxysmal anxiety] without agoraphobia: Secondary | ICD-10-CM

## 2019-09-13 DIAGNOSIS — E039 Hypothyroidism, unspecified: Secondary | ICD-10-CM | POA: Diagnosis not present

## 2019-09-13 MED ORDER — LEVOTHYROXINE SODIUM 25 MCG PO TABS: 25.0000 ug | ORAL_TABLET | Freq: Every day | ORAL | 1 refills | Status: DC

## 2019-09-13 MED ORDER — BUPROPION HCL ER (XL) 150 MG PO TB24: 150.0000 mg | ORAL_TABLET | Freq: Every day | ORAL | 1 refills | Status: DC

## 2019-09-13 NOTE — Progress Notes (Signed)
Name: Jean Foster   MRN: [id number]    DOB: 22-Jan-1986   Date:09/13/2019       Progress Note  Subjective  Chief Complaint  Chief Complaint  Patient presents with  . Hypothyroidism    follow up/medication refills  . Depression    HPI  Hypothyroid - recent labs showed normal TSH; taking 75mcg daily and feeling like her energy has really improved since being on her medication.  Still having some palpitations 1-2 times a month for a few minutes and normally only when she is anxious.  No hair/skin/nail changes, constipation, heat/cold intolerance.  Pt presents to follow up on anxiety and depression.  She feels that she is doing well on current regimen of Buspar 10mg  BID, hydroxyzine PRN for anxiety (usually about once daily now instead of twice), Wellbutrin 150mg  XR.  Has only had 1 panic attack since our last visit 2 months ago (states was situational and was much milder than previous).  Denies SI/HI.    Office Visit from 09/13/2019 in [facility]  PHQ-9 Total Score  2      Patient Active Problem List   Diagnosis Date Noted  . Acquired hypothyroidism 04/30/2019  . Panic attack 04/13/2019  . Cystic acne 01/01/2019  . Depression 10/02/2015  . Human papilloma virus infection 10/02/2015    Past Surgical History:  Procedure Laterality Date  . NO PAST SURGERIES    . TOOTH EXTRACTION      Family History  Problem Relation Age of Onset  . Hypertension Mother   . COPD Father   . Depression Sister   . Depression Sister   . Bipolar disorder Maternal Grandmother   . Bladder Cancer Maternal Grandfather   . Lung cancer Paternal Grandmother   . Alcoholism Paternal Grandfather   . Cirrhosis Paternal Grandfather   . Breast cancer Neg Hx   . Ovarian cancer Neg Hx   . Colon cancer Neg Hx   . Diabetes Neg Hx     Social History   Socioeconomic History  . Marital status: Divorced    Spouse name: Not on file  . Number of children: 2  . Years of education:  86  . Highest education level: Bachelor's degree (e.g., BA, AB, BS)  Occupational History  . Occupation: [employer]  Tobacco Use  . Smoking status: Former Smoker    Packs/day: 0.25    Years: 12.00    Pack years: 3.00    Types: Cigarettes    Quit date: 06/16/2017    Years since quitting: 2.2  . Smokeless tobacco: Never Used  Substance and Sexual Activity  . Alcohol use: Yes    Alcohol/week: 2.0 - 3.0 standard drinks    Types: 2 - 3 Cans of beer per week    Comment: once a week  . Drug use: No    Comment: last used 03/2017  . Sexual activity: Yes    Partners: Male    Birth control/protection: I.U.D.    Comment: plans paraguard after delivery  Other Topics Concern  . Not on file  Social History Narrative  . Not on file   Social Determinants of Health   Financial Resource Strain: Low Risk   . Difficulty of Paying Living Expenses: Not hard at all  Food Insecurity: No Food Insecurity  . Worried About Charity fundraiser in the Last Year: Never true  . Ran Out of Food in the Last Year: Never true  Transportation Needs: No Transportation Needs  .  Lack of Transportation (Medical): No  . Lack of Transportation (Non-Medical): No  Physical Activity: Inactive  . Days of Exercise per Week: 0 days  . Minutes of Exercise per Session: 0 min  Stress: No Stress Concern Present  . Feeling of Stress : Not at all  Social Connections: Moderately Isolated  . Frequency of Communication with Friends and Family: More than three times a week  . Frequency of Social Gatherings with Friends and Family: Never  . Attends Religious Services: Never  . Active Member of Clubs or Organizations: No  . Attends Archivist Meetings: Never  . Marital Status: Divorced  Human resources officer Violence: Not At Risk  . Fear of Current or Ex-Partner: No  . Emotionally Abused: No  . Physically Abused: No  . Sexually Abused: No     Current Outpatient Medications:  .  albuterol (PROVENTIL HFA;VENTOLIN  HFA) 108 (90 Base) MCG/ACT inhaler, Inhale into the lungs every 6 (six) hours as needed for wheezing or shortness of breath., Disp: , Rfl:  .  buPROPion (WELLBUTRIN XL) 150 MG 24 hr tablet, TAKE 1 TABLET BY MOUTH DAILY, Disp: 30 tablet, Rfl: 0 .  busPIRone (BUSPAR) 10 MG tablet, Take 1 tablet (10 mg total) by mouth 2 (two) times daily., Disp: 180 tablet, Rfl: 1 .  Dapsone 7.5 % GEL, Apply 1 application topically daily., Disp: , Rfl:  .  hydrOXYzine (ATARAX/VISTARIL) 25 MG tablet, Take 1 tablet (25 mg total) by mouth 3 (three) times daily as needed for anxiety., Disp: 90 tablet, Rfl: 1 .  levothyroxine (SYNTHROID) 25 MCG tablet, Take 1 tablet (25 mcg total) by mouth daily before breakfast., Disp: 90 tablet, Rfl: 1 .  Multiple Vitamin (MULTIVITAMIN) tablet, Take 1 tablet by mouth daily., Disp: , Rfl:  .  Norgestimate-Ethinyl Estradiol Triphasic 0.18/0.215/0.25 MG-25 MCG tab, Take 1 tablet by mouth daily., Disp: 3 Package, Rfl: 4 .  spironolactone (ALDACTONE) 25 MG tablet, Take 3 tablets (75 mg total) by mouth once for 1 dose. TK 1 T PO BID WITH WATER, Disp: , Rfl:  .  vitamin B-12 (CYANOCOBALAMIN) 500 MCG tablet, Take 500 mcg by mouth daily., Disp: , Rfl:  .  vitamin C (ASCORBIC ACID) 500 MG tablet, Take 500 mg by mouth daily., Disp: , Rfl:   No Known Allergies  I personally reviewed active problem list, medication list, allergies, notes from last encounter, lab results with the patient/caregiver today.   ROS  Constitutional: Negative for fever or weight change.  Respiratory: Negative for cough and shortness of breath.   Cardiovascular: Negative for chest pain or palpitations.  Gastrointestinal: Negative for abdominal pain, no bowel changes.  Musculoskeletal: Negative for gait problem or joint swelling.  Skin: Negative for rash.  Neurological: Negative for dizziness or headache.  No other specific complaints in a complete review of systems (except as listed in HPI  above).  Objective  Vitals:   09/13/19 1354  BP: 112/70  Pulse: 66  Resp: 16  Temp: (!) 97.1 F (36.2 C)  TempSrc: Temporal  SpO2: 96%  Weight: 165 lb (74.8 kg)  Height: 5\' 9"  (1.753 m)   Body mass index is 24.37 kg/m.  Physical Exam  Constitutional: Patient appears well-developed and well-nourished. No distress.  HENT: Head: Normocephalic and atraumatic.  Eyes: Conjunctivae and EOM are normal. No scleral icterus.  Neck: Normal range of motion. Neck supple. No JVD present. No thyromegaly present.  Cardiovascular: Normal rate, regular rhythm and normal heart sounds.  No murmur heard. No  BLE edema. Pulmonary/Chest: Effort normal and breath sounds normal. No respiratory distress. Musculoskeletal: Normal range of motion, no joint effusions. No gross deformities Neurological: Pt is alert and oriented to person, place, and time. No cranial nerve deficit. Coordination, balance, strength, speech and gait are normal.  Skin: Skin is warm and dry. No rash noted. No erythema.  Psychiatric: Patient has a normal mood and affect. behavior is normal. Judgment and thought content normal.  No results found for this or any previous visit (from the past 72 hour(s)).  PHQ2/9: Depression screen [facility] 2/9 09/13/2019 07/05/2019 04/29/2019 04/13/2019 01/01/2019  Decreased Interest 0 0 0 0 0  Down, Depressed, Hopeless 0 1 0 0 0  PHQ - 2 Score 0 1 0 0 0  Altered sleeping 1 3 1 1  0  Tired, decreased energy 1 0 0 0 0  Change in appetite 0 0 0 0 0  Feeling bad or failure about yourself  0 0 0 0 0  Trouble concentrating 0 0 0 0 0  Moving slowly or fidgety/restless 0 0 0 0 0  Suicidal thoughts 0 0 0 0 0  PHQ-9 Score 2 4 1 1  0  Difficult doing work/chores Not difficult at all - Not difficult at all Not difficult at all Not difficult at all   PHQ-2/9 Result is negative.    Fall Risk: Fall Risk  09/13/2019 07/05/2019 04/29/2019 04/13/2019 01/01/2019  Falls in the past year? 0 0 0 0 0  Number falls in past  yr: 0 0 0 0 -  Injury with Fall? 0 0 0 0 -  Follow up Falls evaluation completed Falls evaluation completed Falls evaluation completed Falls evaluation completed -    Assessment & Plan  1. Recurrent major depressive disorder, in partial remission ([place]) - Doing well on current regimen - buPROPion (WELLBUTRIN XL) 150 MG 24 hr tablet; Take 1 tablet (150 mg total) by mouth daily.  Dispense: 90 tablet; Refill: 1 - COMPLETE METABOLIC PANEL WITH GFR  2. Panic attack - buPROPion (WELLBUTRIN XL) 150 MG 24 hr tablet; Take 1 tablet (150 mg total) by mouth daily.  Dispense: 90 tablet; Refill: 1 - COMPLETE METABOLIC PANEL WITH GFR  3. Acquired hypothyroidism - Energy levels much improved. - levothyroxine (SYNTHROID) 25 MCG tablet; Take 1 tablet (25 mcg total) by mouth daily before breakfast.  Dispense: 90 tablet; Refill: 1 - TSH  4. Medication monitoring encounter - COMPLETE METABOLIC PANEL WITH GFR - TSH

## 2019-09-13 NOTE — Telephone Encounter (Signed)
Requested Prescriptions  Pending Prescriptions Disp Refills  . buPROPion (WELLBUTRIN XL) 150 MG 24 hr tablet [Pharmacy Med Name: buPROPion HCL ER (XL) 150 M 150 Tablet] 30 tablet 0    Sig: TAKE 1 TABLET BY MOUTH DAILY     Psychiatry: Antidepressants - bupropion Passed - 09/13/2019 10:10 AM      Passed - Completed PHQ-2 or PHQ-9 in the last 360 days.      Passed - Last BP in normal range    BP Readings from Last 1 Encounters:  07/05/19 110/68         Passed - Valid encounter within last 6 months    Recent Outpatient Visits          2 months ago Panic attack   [place], FNP   4 months ago Well woman exam (no gynecological exam)   [place], FNP   5 months ago Recurrent major depressive disorder, in partial remission [facility])   [place], FNP   8 months ago Cystic acne   [place], [place]      Future Appointments            Today Hubbard Hartshorn, [facility], [place]   In 7 months Rubie Maid, MD [facility]

## 2019-09-14 LAB — COMPLETE METABOLIC PANEL WITH GFR
AG Ratio: 1.5 (calc) (ref 1.0–2.5)
ALT: 10 U/L (ref 6–29)
AST: 13 U/L (ref 10–30)
Albumin: 4.4 g/dL (ref 3.6–5.1)
Alkaline phosphatase (APISO): 37 U/L (ref 31–125)
BUN: 15 mg/dL (ref 7–25)
CO2: 27 mmol/L (ref 20–32)
Calcium: 10.1 mg/dL (ref 8.6–10.2)
Chloride: 106 mmol/L (ref 98–110)
Creat: 1 mg/dL (ref 0.50–1.10)
GFR, Est African American: 86 mL/min/{1.73_m2} (ref >=60)
GFR, Est Non African American: 74 mL/min/{1.73_m2} (ref >=60)
Globulin: 2.9 g/dL (calc) (ref 1.9–3.7)
Glucose, Bld: 76 mg/dL (ref 65–99)
Potassium: 4.6 mmol/L (ref 3.5–5.3)
Sodium: 142 mmol/L (ref 135–146)
Total Bilirubin: 0.2 mg/dL (ref 0.2–1.2)
Total Protein: 7.3 g/dL (ref 6.1–8.1)

## 2019-09-14 LAB — TSH: TSH: 2.9 mIU/L

## 2019-09-27 ENCOUNTER — Other Ambulatory Visit: Payer: Self-pay | Admitting: Family Medicine

## 2019-09-27 DIAGNOSIS — F3341 Major depressive disorder, recurrent, in partial remission: Secondary | ICD-10-CM

## 2019-09-27 DIAGNOSIS — F410 Panic disorder [episodic paroxysmal anxiety] without agoraphobia: Secondary | ICD-10-CM

## 2019-10-04 ENCOUNTER — Other Ambulatory Visit: Payer: Self-pay | Admitting: Family Medicine

## 2019-10-04 DIAGNOSIS — F3341 Major depressive disorder, recurrent, in partial remission: Secondary | ICD-10-CM

## 2019-10-04 DIAGNOSIS — F410 Panic disorder [episodic paroxysmal anxiety] without agoraphobia: Secondary | ICD-10-CM

## 2019-12-15 ENCOUNTER — Other Ambulatory Visit: Payer: Self-pay

## 2019-12-15 ENCOUNTER — Ambulatory Visit: Payer: No Typology Code available for payment source | Admitting: Dermatology

## 2019-12-15 VITALS — BP 111/69

## 2019-12-15 DIAGNOSIS — L700 Acne vulgaris: Secondary | ICD-10-CM

## 2019-12-15 MED ORDER — DAPSONE 7.5 % EX GEL: 1.0000 "application " | Freq: Every morning | CUTANEOUS | 2 refills | Status: DC

## 2019-12-15 MED ORDER — SPIRONOLACTONE 100 MG PO TABS: 100.0000 mg | ORAL_TABLET | Freq: Every day | ORAL | 6 refills | Status: DC

## 2019-12-15 MED ORDER — DOXYCYCLINE HYCLATE 20 MG PO TABS: 20.0000 mg | ORAL_TABLET | Freq: Two times a day (BID) | ORAL | 6 refills | Status: AC

## 2019-12-15 MED ORDER — ADAPALENE 0.3 % EX GEL: CUTANEOUS | 6 refills | Status: DC

## 2019-12-15 NOTE — Patient Instructions (Addendum)
Doxycycline should be taken with food to prevent nausea. Do not lay down for 30 minutes after taking. Be cautious with sun exposure and use good sun protection while on this medication. Pregnant women should not take this medication.   Spironolactone can cause increased urination and cause blood pressure to decrease. Please watch for signs of lightheadedness and be cautious when changing position. It can sometimes cause breast tenderness or an irregular period in premenopausal women. It can also increase potassium. The increase in potassium usually is not a concern unless you are taking other medicines that also increase potassium, so please be sure your doctor knows all of the other medications you are taking. This medication should not be taken  by pregnant women.  Topical retinoid medications like tretinoin/Retin-A, adapalene/Differin, tazarotene/Fabior, and Epiduo/Epiduo Forte can cause dryness and irritation when first started. Only apply a pea-sized amount to the entire affected area. Avoid applying it around the eyes, edges of mouth and creases at the nose. If you experience irritation, use a good moisturizer first and/or apply the medicine less often. If you are doing well with the medicine, you can increase how often you use it until you are applying every night. Be careful with sun protection while using this medication as it can make you sensitive to the sun. This medicine should not be used by pregnant women.

## 2019-12-15 NOTE — Progress Notes (Signed)
   Follow-Up Visit   Subjective  Jean Foster is a 34 y.o. female who presents for the following: Acne (2 month follow up, Aczone 7.5%, adapalene 0.3%, doxycycline 20mg  bid, spironolactone 100mg  qhs. Pt improved and pleased with results. ).  Patient not having any side effects on doxycycline or spironolactone. Tolerating topicals well.   The following portions of the chart were reviewed this encounter and updated as appropriate: Tobacco  Allergies  Meds  Problems  Med Hx  Surg Hx  Fam Hx      Review of Systems: No other skin or systemic complaints.  Objective  Well appearing patient in no apparent distress; mood and affect are within normal limits.  A focused examination was performed including face, shoulders, back. Relevant physical exam findings are noted in the Assessment and Plan.  Objective  face: Face is clear, shoulder and back with trace open comedones  Assessment & Plan  Acne vulgaris face  Chronic. Doing well currently.   Cont spironolactone 100mg  1 PO QHS Cont Aczone 7.5% QAM Cont adapalene 0.3% gel Cont doxycycline 20mg  decreasing to 1 PO QD then discontinue. May restart PRN. If still clear in 1 month after stopping doxycycline may d/c Aczone.  Consider increasing spironolactone if flares when decreasing doxycycline.   Doxycycline should be taken with food to prevent nausea. Do not lay down for 30 minutes after taking. Be cautious with sun exposure and use good sun protection while on this medication. Pregnant women should not take this medication.    Spironolactone can cause increased urination and cause blood pressure to decrease. Please watch for signs of lightheadedness and be cautious when changing position. It can sometimes cause breast tenderness or an irregular period in premenopausal women. It can also increase potassium. The increase in potassium usually is not a concern unless you are taking other medicines that also increase potassium, so please  be sure your doctor knows all of the other medications you are taking. This medication should not be taken  by pregnant women.   Dapsone (ACZONE) 7.5 % GEL - face  spironolactone (ALDACTONE) 100 MG tablet - face  doxycycline (PERIOSTAT) 20 MG tablet - face  Adapalene (DIFFERIN) 0.3 % gel - face  Return in about 6 months (around 06/15/2020) for acne.   Graciella Belton, RMA, am acting as scribe for Forest Gleason, MD .  Documentation: I have reviewed the above documentation for accuracy and completeness, and I agree with the above.  Forest Gleason, MD

## 2019-12-21 ENCOUNTER — Encounter: Payer: Self-pay | Admitting: Dermatology

## 2020-01-06 ENCOUNTER — Ambulatory Visit: Payer: No Typology Code available for payment source | Admitting: Dermatology

## 2020-03-13 ENCOUNTER — Ambulatory Visit: Payer: No Typology Code available for payment source | Admitting: Family Medicine

## 2020-03-30 ENCOUNTER — Other Ambulatory Visit: Payer: Self-pay

## 2020-03-30 ENCOUNTER — Ambulatory Visit: Payer: No Typology Code available for payment source | Admitting: Dermatology

## 2020-03-30 DIAGNOSIS — L709 Acne, unspecified: Secondary | ICD-10-CM | POA: Diagnosis not present

## 2020-03-30 NOTE — Patient Instructions (Signed)
Recommend daily broad spectrum sunscreen SPF 30+ to sun-exposed areas, reapply every 2 hours as needed. Call for new or changing lesions.  

## 2020-03-30 NOTE — Progress Notes (Signed)
   Follow-Up Visit   Subjective  Jean Foster is a 34 y.o. female who presents for the following: Acne.  Patient presents today for Acne on her left chin. Patient is taking Aczone 7.5%, adapalene 0.3%, doxycycline 20mg  bid, and spironolactone 100mg  qhs.  The following portions of the chart were reviewed this encounter and updated as appropriate:  Tobacco  Allergies  Meds  Problems  Med Hx  Surg Hx  Fam Hx     Review of Systems:  No other skin or systemic complaints except as noted in HPI or Assessment and Plan.  Objective  Well appearing patient in no apparent distress; mood and affect are within normal limits.  A focused examination was performed including Left Chin. Relevant physical exam findings are noted in the Assessment and Plan.  Objective  Left Chin: Erythematous red papule with pain   Assessment & Plan    Acne, unspecified acne type Left Chin  Continue Aczone 7.5% QD, adapalene 0.3%QD, doxycycline 20mg  BID, spironolactone 100mg  QHS ILK injection today  Acne/Milia surgery - Left Chin Location: Left Chin  Informed Consent: Discussed risks (infection, pain, bleeding, bruising, thinning of the skin, loss of skin pigment, lack of resolution, and recurrence of lesion) and benefits of the procedure, as well as the alternatives. Informed consent was obtained. Preparation: The area was prepared a standard fashion.  Anesthesia: none  Procedure Details: An intralesional injection was performed with Kenalog 1.25 mg/cc. 0.1 cc in total were injected.  Total number of injections: 2  Plan: The patient was instructed on post-op care. Recommend OTC analgesia as needed for pain.  Lot: [id number] EXP: 06/15/2021  Follow up as scheduled.  Marene Lenz, CMA, am acting as scribe for Sarina Ser, MD . Documentation: I have reviewed the above documentation for accuracy and completeness, and I agree with the above.  Sarina Ser, MD

## 2020-04-03 ENCOUNTER — Encounter: Payer: Self-pay | Admitting: Dermatology

## 2020-04-03 ENCOUNTER — Telehealth: Payer: Self-pay

## 2020-04-03 DIAGNOSIS — F3341 Major depressive disorder, recurrent, in partial remission: Secondary | ICD-10-CM

## 2020-04-03 DIAGNOSIS — F410 Panic disorder [episodic paroxysmal anxiety] without agoraphobia: Secondary | ICD-10-CM

## 2020-04-03 NOTE — Telephone Encounter (Signed)
Pt needs appt for refills

## 2020-04-04 NOTE — Telephone Encounter (Signed)
Medication Refill - Medication: hydroxyzine and buspirone. Patient has made appt. But will be out of medication before appointment date.  Has the patient contacted their pharmacy? Yes.   (Agent: If no, request that the patient contact the pharmacy for the refill.) (Agent: If yes, when and what did the pharmacy advise?)  Preferred Pharmacy (with phone number or street name): [facility] [place], [place]: Please be advised that RX refills may take up to 3 business days. We ask that you follow-up with your pharmacy.

## 2020-04-04 NOTE — Telephone Encounter (Signed)
Lvm to schedule appt for refills

## 2020-04-05 ENCOUNTER — Other Ambulatory Visit: Payer: Self-pay

## 2020-04-05 MED ORDER — HYDROXYZINE HCL 25 MG PO TABS: 25.0000 mg | ORAL_TABLET | Freq: Three times a day (TID) | ORAL | 0 refills | Status: DC | PRN

## 2020-04-05 MED ORDER — BUSPIRONE HCL 10 MG PO TABS: 10.0000 mg | ORAL_TABLET | Freq: Two times a day (BID) | ORAL | 0 refills | Status: DC

## 2020-04-05 NOTE — Telephone Encounter (Signed)
According to last fill date of rx pt should have already been out of meds.  Will send in 30 day supply

## 2020-04-05 NOTE — Telephone Encounter (Signed)
Sent to wrong pharmacy.

## 2020-04-06 MED ORDER — BUSPIRONE HCL 10 MG PO TABS: 10.0000 mg | ORAL_TABLET | Freq: Two times a day (BID) | ORAL | 0 refills | Status: DC

## 2020-04-10 ENCOUNTER — Other Ambulatory Visit: Payer: Self-pay | Admitting: Family Medicine

## 2020-04-10 ENCOUNTER — Ambulatory Visit (INDEPENDENT_AMBULATORY_CARE_PROVIDER_SITE_OTHER): Payer: No Typology Code available for payment source | Admitting: Family Medicine

## 2020-04-10 ENCOUNTER — Other Ambulatory Visit: Payer: Self-pay

## 2020-04-10 ENCOUNTER — Encounter: Payer: Self-pay | Admitting: Family Medicine

## 2020-04-10 VITALS — BP 100/62 | HR 96 | Temp 97.5°F | Resp 16 | Ht 69.0 in | Wt 170.8 lb

## 2020-04-10 DIAGNOSIS — E039 Hypothyroidism, unspecified: Secondary | ICD-10-CM | POA: Diagnosis not present

## 2020-04-10 DIAGNOSIS — Z131 Encounter for screening for diabetes mellitus: Secondary | ICD-10-CM

## 2020-04-10 DIAGNOSIS — R5383 Other fatigue: Secondary | ICD-10-CM

## 2020-04-10 DIAGNOSIS — Z0000 Encounter for general adult medical examination without abnormal findings: Secondary | ICD-10-CM

## 2020-04-10 DIAGNOSIS — F319 Bipolar disorder, unspecified: Secondary | ICD-10-CM

## 2020-04-10 DIAGNOSIS — F410 Panic disorder [episodic paroxysmal anxiety] without agoraphobia: Secondary | ICD-10-CM | POA: Diagnosis not present

## 2020-04-10 DIAGNOSIS — Z13220 Encounter for screening for lipoid disorders: Secondary | ICD-10-CM

## 2020-04-10 DIAGNOSIS — L700 Acne vulgaris: Secondary | ICD-10-CM

## 2020-04-10 DIAGNOSIS — Z5181 Encounter for therapeutic drug level monitoring: Secondary | ICD-10-CM | POA: Diagnosis not present

## 2020-04-10 DIAGNOSIS — F3341 Major depressive disorder, recurrent, in partial remission: Secondary | ICD-10-CM

## 2020-04-10 MED ORDER — BUSPIRONE HCL 10 MG PO TABS: 10.0000 mg | ORAL_TABLET | Freq: Two times a day (BID) | ORAL | 1 refills | Status: DC

## 2020-04-10 MED ORDER — BUPROPION HCL ER (XL) 150 MG PO TB24: 150.0000 mg | ORAL_TABLET | Freq: Every day | ORAL | 1 refills | Status: DC

## 2020-04-10 MED ORDER — ARIPIPRAZOLE 2 MG PO TABS: 2.0000 mg | ORAL_TABLET | Freq: Every day | ORAL | 1 refills | Status: DC

## 2020-04-10 MED ORDER — HYDROXYZINE HCL 25 MG PO TABS: 25.0000 mg | ORAL_TABLET | Freq: Three times a day (TID) | ORAL | 1 refills | Status: DC | PRN

## 2020-04-10 NOTE — Progress Notes (Signed)
Name: Jean Foster   MRN: [id number]    DOB: March 05, 1986   Date:04/10/2020       Progress Note  Subjective  Chief Complaint  Chief Complaint  Patient presents with  . Medication Refill    HPI  Patient presents for annual CPE and follow up  Hypothyroid - recent labs showed normal TSH; taking 95mg daily , no change in bowel movements, dry skin or palpitation.   MDD and GAD with panic attacks: she states she has a long history of depression, diagnosed in college - she was [facility] She states symptoms were severe and had to take medications in College - she states initially diagnosed with bipolar depression. She had highs and lows, she had suicidal thoughts , she saw a psychiatrist in the past , went a period of time without medication but resumed medication. Only on wellbutrin, she states not manic episodes since college she has been only depressed . She states she tried lexapro but it caused sexual dysfunction . High dose Buspar causes RLS. She is on hydroxyzine prn or at night    Diet: balanced diet but likes fast food  Exercise: she goes to the gym three times a week   USPSTF grade A and B recommendations    [facility] 0     Depression: Phq 9 is  [facility] 04/10/2020 09/13/2019 07/05/2019 04/29/2019 04/13/2019  Decreased Interest 0 0 0 0 0  Down, Depressed, Hopeless 0 0 1 0 0  PHQ - 2 Score 0 0 1 0 0  Altered sleeping _0 Tired, decreased energy 1 1 0 0 0  Change in appetite 0 0 0 0 0  Feeling bad or failure about yourself  0 0 0 0 0  Trouble concentrating 0 0 0 0 0  Moving slowly or fidgety/restless 0 0 0 0 0  Suicidal thoughts 0 0 0 0 0  PHQ-9 Score _1 Difficult doing work/chores Not difficult at all Not difficult at all - Not difficult at all Not difficult at all   Hypertension: BP Readings from Last 3 Encounters:  04/10/20 (!) 100/62  12/15/19 111/69   09/13/19 112/70   Obesity: Wt Readings from Last 3 Encounters:  04/10/20 170 lb 12.8 oz (77.5 kg)  09/13/19 165 lb (74.8 kg)  07/05/19 161 lb 6.4 oz (73.2 kg)   BMI Readings from Last 3 Encounters:  04/10/20 25.22 kg/m  09/13/19 24.37 kg/m  07/05/19 23.83 kg/m     Hep C Screening: done 2019  STD testing and prevention (HIV/chl/gon/syphilis): not interested  Intimate partner violence: negative screen  Sexual History (Partners/Practices/Protection from [employer] STI/Pregnancy Plans): no problems, had HPV back in college  Pain during Intercourse: no  Menstrual History/LMP/Abnormal Bleeding: regular cycles, on ocp Incontinence Symptoms: no problems   Breast cancer:  - BRCA gene screening: N/A  Osteoporosis: Discussed high calcium and vitamin D supplementation, weight bearing exercises  Cervical cancer screening: she will see Dr. CMarcelline Matessoon   Skin cancer: Discussed monitoring for atypical lesions  Colorectal cancer: start at age 34 Lung cancer:  Low Dose CT Chest recommended if Age 34-80years, 30 pack-year currently smoking OR have quit w/in 15years. Patient does not qualify.     Advanced Care Planning: A voluntary discussion about advance care planning including the explanation and discussion of advance directives.  Discussed health care  proxy and Living will, and the patient was able to identify a health care proxy as mother .  Patient does not have a living will at present time. If patient does have living will, I have requested they bring this to the clinic to be scanned in to their chart.  Lipids: Lab Results  Component Value Date   CHOL 191 04/29/2019   Lab Results  Component Value Date   HDL 48 (L) 04/29/2019   Lab Results  Component Value Date   LDLCALC 120 (H) 04/29/2019   Lab Results  Component Value Date   TRIG 115 04/29/2019   Lab Results  Component Value Date   CHOLHDL 4.0 04/29/2019   No results found for: LDLDIRECT  Glucose: Glucose, Bld   Date Value Ref Range Status  09/13/2019 76 65 - 99 mg/dL Final    Comment:    .            Fasting reference interval .   04/29/2019 87 65 - 99 mg/dL Final    Comment:    .            Fasting reference interval .   06/23/2017 163 (H) 65 - 99 mg/dL Final    Patient Active Problem List   Diagnosis Date Noted  . Bipolar disorder with depression ([place]) 04/10/2020  . Acquired hypothyroidism 04/30/2019  . Panic attack 04/13/2019  . Cystic acne 01/01/2019  . Human papilloma virus infection 10/02/2015    Past Surgical History:  Procedure Laterality Date  . NO PAST SURGERIES    . TOOTH EXTRACTION      Family History  Problem Relation Age of Onset  . Hypertension Mother   . COPD Father   . Depression Sister   . Depression Sister   . Bipolar disorder Maternal Grandmother   . Bladder Cancer Maternal Grandfather   . Lung cancer Paternal Grandmother   . Alcoholism Paternal Grandfather   . Cirrhosis Paternal Grandfather   . Breast cancer Neg Hx   . Ovarian cancer Neg Hx   . Colon cancer Neg Hx   . Diabetes Neg Hx     Social History   Socioeconomic History  . Marital status: Divorced    Spouse name: Not on file  . Number of children: 2  . Years of education: 31  . Highest education level: Bachelor's degree (e.g., BA, AB, BS)  Occupational History  . Occupation: Time  Tobacco Use  . Smoking status: Former Smoker    Packs/day: 0.25    Years: 12.00    Pack years: 3.00    Types: Cigarettes    Quit date: 06/16/2017    Years since quitting: 2.8  . Smokeless tobacco: Never Used  Vaping Use  . Vaping Use: Never used  Substance and Sexual Activity  . Alcohol use: Yes    Alcohol/week: 2.0 - 3.0 standard drinks    Types: 2 - 3 Cans of beer per week    Comment: once a week  . Drug use: No    Comment: last used 03/2017  . Sexual activity: Yes    Partners: Male    Birth control/protection: I.U.D.    Comment: plans paraguard after delivery  Other Topics Concern   . Not on file  Social History Narrative  . Not on file   Social Determinants of Health   Financial Resource Strain: Low Risk   . Difficulty of Paying Living Expenses: Not hard at all  Food Insecurity: No Food Insecurity  .  Worried About Charity fundraiser in the Last Year: Never true  . Ran Out of Food in the Last Year: Never true  Transportation Needs: No Transportation Needs  . Lack of Transportation (Medical): No  . Lack of Transportation (Non-Medical): No  Physical Activity: Sufficiently Active  . Days of Exercise per Week: 3 days  . Minutes of Exercise per Session: 60 min  Stress: Stress Concern Present  . Feeling of Stress : Very much  Social Connections: Moderately Isolated  . Frequency of Communication with Friends and Family: More than three times a week  . Frequency of Social Gatherings with Friends and Family: More than three times a week  . Attends Religious Services: Never  . Active Member of Clubs or Organizations: No  . Attends Archivist Meetings: Never  . Marital Status: Living with partner  Intimate Partner Violence: Not At Risk  . Fear of Current or Ex-Partner: No  . Emotionally Abused: No  . Physically Abused: No  . Sexually Abused: No     Current Outpatient Medications:  .  Adapalene (DIFFERIN) 0.3 % gel, Apply topically at bedtime, Disp: 45 g, Rfl: 6 .  albuterol (PROVENTIL HFA;VENTOLIN HFA) 108 (90 Base) MCG/ACT inhaler, Inhale into the lungs every 6 (six) hours as needed for wheezing or shortness of breath., Disp: , Rfl:  .  buPROPion (WELLBUTRIN XL) 150 MG 24 hr tablet, Take 1 tablet (150 mg total) by mouth daily., Disp: 90 tablet, Rfl: 1 .  busPIRone (BUSPAR) 10 MG tablet, Take 1 tablet (10 mg total) by mouth 2 (two) times daily., Disp: 180 tablet, Rfl: 1 .  Dapsone (ACZONE) 7.5 % GEL, Apply 1 application topically in the morning., Disp: 60 g, Rfl: 2 .  Dapsone 7.5 % GEL, Apply 1 application topically daily., Disp: , Rfl:  .  doxycycline  (PERIOSTAT) 20 MG tablet, Take 20 mg by mouth 2 (two) times daily., Disp: , Rfl:  .  hydrOXYzine (ATARAX/VISTARIL) 25 MG tablet, Take 1 tablet (25 mg total) by mouth 3 (three) times daily as needed. for anxiety, Disp: 100 tablet, Rfl: 1 .  levothyroxine (SYNTHROID) 25 MCG tablet, Take 1 tablet (25 mcg total) by mouth daily before breakfast., Disp: 90 tablet, Rfl: 1 .  Multiple Vitamin (MULTIVITAMIN) tablet, Take 1 tablet by mouth daily., Disp: , Rfl:  .  Norgestimate-Ethinyl Estradiol Triphasic 0.18/0.215/0.25 MG-25 MCG tab, Take 1 tablet by mouth daily., Disp: 3 Package, Rfl: 4 .  spironolactone (ALDACTONE) 100 MG tablet, Take 100 mg by mouth daily., Disp: , Rfl:  .  vitamin B-12 (CYANOCOBALAMIN) 500 MCG tablet, Take 500 mcg by mouth daily., Disp: , Rfl:  .  vitamin C (ASCORBIC ACID) 500 MG tablet, Take 500 mg by mouth daily., Disp: , Rfl:  .  ARIPiprazole (ABILIFY) 2 MG tablet, Take 1 tablet (2 mg total) by mouth daily., Disp: 90 tablet, Rfl: 1  No Known Allergies   ROS  Constitutional: Negative for fever or weight change.  Respiratory: Negative for cough and shortness of breath.   Cardiovascular: Negative for chest pain or palpitations.  Gastrointestinal: Negative for abdominal pain, no bowel changes.  Musculoskeletal: Negative for gait problem or joint swelling.  Skin: Negative for rash.  Neurological: Negative for dizziness or headache.  No other specific complaints in a complete review of systems (except as listed in HPI above).  Objective  Vitals:   04/10/20 1052  BP: (!) 100/62  Pulse: 96  Resp: 16  Temp: (!) 97.5 F (36.4  C)  TempSrc: Temporal  SpO2: 98%  Weight: 170 lb 12.8 oz (77.5 kg)  Height: _0  (1.753 m)    Body mass index is 25.22 kg/m.  Physical Exam  Constitutional: Patient appears well-developed and well-nourished. No distress.  HENT: Head: Normocephalic and atraumatic. Ears: B TMs ok, no erythema or effusion; Nose: Nose normal. Mouth/Throat: not  done Eyes: Conjunctivae and EOM are normal. Pupils are equal, round, and reactive to light. No scleral icterus.  Neck: Normal range of motion. Neck supple. No JVD present. No thyromegaly present.  Cardiovascular: Normal rate, regular rhythm and normal heart sounds.  No murmur heard. No BLE edema. Pulmonary/Chest: Effort normal and breath sounds normal. No respiratory distress. Abdominal: Soft. Bowel sounds are normal, no distension. There is no tenderness. no masses Breast: no lumps or masses, no nipple discharge or rashes FEMALE GENITALIA:  Not done RECTAL: not  done  Musculoskeletal: Normal range of motion, no joint effusions. No gross deformities Neurological: he is alert and oriented to person, place, and time. No cranial nerve deficit. Coordination, balance, strength, speech and gait are normal.  Skin: Skin is warm and dry. No rash noted. No erythema.  Psychiatric: Patient has a normal mood and affect. behavior is normal. Judgment and thought content normal.  Fall Risk: Fall Risk  09/13/2019 07/05/2019 04/29/2019 04/13/2019 01/01/2019  Falls in the past year? 0 0 0 0 0  Number falls in past yr: 0 0 0 0 -  Injury with Fall? 0 0 0 0 -  Follow up Falls evaluation completed Falls evaluation completed Falls evaluation completed Falls evaluation completed -    Assessment & Plan  1. Bipolar disorder with depression (HCC)  - ARIPiprazole (ABILIFY) 2 MG tablet; Take 1 tablet (2 mg total) by mouth daily.  Dispense: 90 tablet; Refill: 1  2. Medication monitoring encounter  - COMPLETE METABOLIC PANEL WITH GFR - CBC with Differential/Platelet  3. Panic attack  - buPROPion (WELLBUTRIN XL) 150 MG 24 hr tablet; Take 1 tablet (150 mg total) by mouth daily.  Dispense: 90 tablet; Refill: 1 - hydrOXYzine (ATARAX/VISTARIL) 25 MG tablet; Take 1 tablet (25 mg total) by mouth 3 (three) times daily as needed. for anxiety  Dispense: 100 tablet; Refill: 1  4. Acquired hypothyroidism  - TSH  5. Well  adult exam   6. Diabetes mellitus screening  - Hemoglobin A1c  7. Lipid screening  - Lipid panel  8. Other fatigue  - VITAMIN D 25 Hydroxy (Vit-D Deficiency, Fractures) - Vitamin B12  9. Recurrent major depressive disorder, in partial remission (HCC)  - buPROPion (WELLBUTRIN XL) 150 MG 24 hr tablet; Take 1 tablet (150 mg total) by mouth daily.  Dispense: 90 tablet; Refill: 1 - hydrOXYzine (ATARAX/VISTARIL) 25 MG tablet; Take 1 tablet (25 mg total) by mouth 3 (three) times daily as needed. for anxiety  Dispense: 100 tablet; Refill: 1  10. Acne cystica  - doxycycline (PERIOSTAT) 20 MG tablet; Take 20 mg by mouth 2 (two) times daily.  -USPSTF grade A and B recommendations reviewed with patient; age-appropriate recommendations, preventive care, screening tests, etc discussed and encouraged; healthy living encouraged; see AVS for patient education given to patient -Discussed importance of 150 minutes of physical activity weekly, eat two servings of fish weekly, eat one serving of tree nuts ( cashews, pistachios, pecans, almonds.Marland Kitchen) every other day, eat 6 servings of fruit/vegetables daily and drink plenty of water and avoid sweet beverages.

## 2020-04-11 LAB — COMPLETE METABOLIC PANEL WITH GFR
AG Ratio: 1.7 (calc) (ref 1.0–2.5)
ALT: 12 U/L (ref 6–29)
AST: 15 U/L (ref 10–30)
Albumin: 4.5 g/dL (ref 3.6–5.1)
Alkaline phosphatase (APISO): 38 U/L (ref 31–125)
BUN: 15 mg/dL (ref 7–25)
CO2: 27 mmol/L (ref 20–32)
Calcium: 9.8 mg/dL (ref 8.6–10.2)
Chloride: 103 mmol/L (ref 98–110)
Creat: 1 mg/dL (ref 0.50–1.10)
GFR, Est African American: 86 mL/min/{1.73_m2} (ref >=60)
GFR, Est Non African American: 74 mL/min/{1.73_m2} (ref >=60)
Globulin: 2.7 g/dL (calc) (ref 1.9–3.7)
Glucose, Bld: 88 mg/dL (ref 65–139)
Potassium: 4.6 mmol/L (ref 3.5–5.3)
Sodium: 138 mmol/L (ref 135–146)
Total Bilirubin: 0.3 mg/dL (ref 0.2–1.2)
Total Protein: 7.2 g/dL (ref 6.1–8.1)

## 2020-04-11 LAB — CBC WITH DIFFERENTIAL/PLATELET
Absolute Monocytes: 521 cells/uL (ref 200–950)
Basophils Absolute: 62 cells/uL (ref 0–200)
Basophils Relative: 1 %
Eosinophils Absolute: 50 cells/uL (ref 15–500)
Eosinophils Relative: 0.8 %
HCT: 39.4 % (ref 35.0–45.0)
Hemoglobin: 12.9 g/dL (ref 11.7–15.5)
Lymphs Abs: 2021 cells/uL (ref 850–3900)
MCH: 28.7 pg (ref 27.0–33.0)
MCHC: 32.7 g/dL (ref 32.0–36.0)
MCV: 87.8 fL (ref 80.0–100.0)
MPV: 9.8 fL (ref 7.5–12.5)
Monocytes Relative: 8.4 %
Neutro Abs: 3546 cells/uL (ref 1500–7800)
Neutrophils Relative %: 57.2 %
Platelets: 317 10*3/uL (ref 140–400)
RBC: 4.49 10*6/uL (ref 3.80–5.10)
RDW: 12.4 % (ref 11.0–15.0)
Total Lymphocyte: 32.6 %
WBC: 6.2 10*3/uL (ref 3.8–10.8)

## 2020-04-11 LAB — HEMOGLOBIN A1C
Hgb A1c MFr Bld: 5.1 % of total Hgb (ref <5.7)
Mean Plasma Glucose: 100 (calc)
eAG (mmol/L): 5.5 (calc)

## 2020-04-11 LAB — LIPID PANEL
Cholesterol: 219 mg/dL — ABNORMAL HIGH (ref <200)
HDL: 56 mg/dL (ref >=50)
LDL Cholesterol (Calc): 123 mg/dL (calc) — ABNORMAL HIGH
Non-HDL Cholesterol (Calc): 163 mg/dL (calc) — ABNORMAL HIGH (ref <130)
Total CHOL/HDL Ratio: 3.9 (calc) (ref <5.0)
Triglycerides: 246 mg/dL — ABNORMAL HIGH (ref <150)

## 2020-04-11 LAB — VITAMIN B12: Vitamin B-12: 842 pg/mL (ref 200–1100)

## 2020-04-11 LAB — TSH: TSH: 2.87 mIU/L

## 2020-04-11 LAB — VITAMIN D 25 HYDROXY (VIT D DEFICIENCY, FRACTURES): Vit D, 25-Hydroxy: 33 ng/mL (ref 30–100)

## 2020-05-04 ENCOUNTER — Encounter: Payer: Self-pay | Admitting: Obstetrics and Gynecology

## 2020-05-04 ENCOUNTER — Other Ambulatory Visit: Payer: Self-pay

## 2020-05-04 ENCOUNTER — Ambulatory Visit (INDEPENDENT_AMBULATORY_CARE_PROVIDER_SITE_OTHER): Payer: No Typology Code available for payment source | Admitting: Obstetrics and Gynecology

## 2020-05-04 VITALS — BP 103/68 | HR 70 | Ht 69.0 in | Wt 163.9 lb

## 2020-05-04 DIAGNOSIS — E039 Hypothyroidism, unspecified: Secondary | ICD-10-CM

## 2020-05-04 DIAGNOSIS — F410 Panic disorder [episodic paroxysmal anxiety] without agoraphobia: Secondary | ICD-10-CM | POA: Diagnosis not present

## 2020-05-04 DIAGNOSIS — Z01419 Encounter for gynecological examination (general) (routine) without abnormal findings: Secondary | ICD-10-CM | POA: Diagnosis not present

## 2020-05-04 DIAGNOSIS — Z3041 Encounter for surveillance of contraceptive pills: Secondary | ICD-10-CM

## 2020-05-04 NOTE — Progress Notes (Signed)
GYNECOLOGY ANNUAL PHYSICAL EXAM PROGRESS NOTE  Subjective:    Jean Foster is a 34 y.o. G2P2002 female who presents for an annual exam. The patient has no complaints today. The patient is sexually active.  The patient wears seatbelts: yes. The patient participates in regular exercise: no. Has the patient ever been transfused or tattooed?: no. The patient reports that there is not domestic violence in her life.   Patient is doing well since starting OCPs, having regular menstrual cycles monthly. She states her periods are less heavy than before and is very pleased with the change. LMP 04/24/2020. She states her anxiety and panic attacks have been under control with medications this past year and her mental health is stable.   Menstrual History: LMP: 04/24/2020  Gynecologic History Menarche age: 7 Contraception: OCPs - Ortho Tri-cyclen Lo History of STI's: Denies Last Pap: 09/14/2018. Results were: normal.  Denies h/o abnormal pap smears.   Upstream - 05/04/20 1414      Pregnancy Intention Screening   Does the patient want to become pregnant in the next year? No    Does the patient's partner want to become pregnant in the next year? No    Would the patient like to discuss contraceptive options today? No      Contraception Wrap Up   Current Method Oral Contraceptive           Upstream - 05/04/20 1414      Pregnancy Intention Screening   Does the patient want to become pregnant in the next year? No    Does the patient's partner want to become pregnant in the next year? No    Would the patient like to discuss contraceptive options today? No      Contraception Wrap Up   Current Method Oral Contraceptive          The pregnancy intention screening data noted above was reviewed. Potential methods of contraception were discussed. The patient elected to continue with Oral Contraceptive.    OB History  Gravida Para Term Preterm AB Living  2 2 2  0 0 2  SAB TAB Ectopic  Multiple Live Births  0 0 0 0 2    # Outcome Date GA Lbr Len/2nd Weight Sex Delivery Anes PTL Lv  2 Term 06/29/18 [redacted]w[redacted]d  6 lb 2.4 oz (2.79 kg) M Vag-Spont None  LIV     Name: Mickelson,BOY Nikkie     Apgar1: 8  Apgar5: 9  1 Term 2013   8 lb 8 oz (3.856 kg) M Vag-Spont   LIV    Past Medical History:  Diagnosis Date  . Acne   . Anxiety   . Asthma   . Depression   . Depression, major, in remission ([place]) 07/05/2019  . Thyroid disease     Past Surgical History:  Procedure Laterality Date  . NO PAST SURGERIES    . TOOTH EXTRACTION      Family History  Problem Relation Age of Onset  . Hypertension Mother   . COPD Father   . Depression Sister   . Depression Sister   . Bipolar disorder Maternal Grandmother   . Bladder Cancer Maternal Grandfather   . Lung cancer Paternal Grandmother   . Alcoholism Paternal Grandfather   . Cirrhosis Paternal Grandfather   . Breast cancer Neg Hx   . Ovarian cancer Neg Hx   . Colon cancer Neg Hx   . Diabetes Neg Hx     Social History   Socioeconomic  History  . Marital status: Divorced    Spouse name: Not on file  . Number of children: 2  . Years of education: 12  . Highest education level: Bachelor's degree (e.g., BA, AB, BS)  Occupational History  . Occupation: [employer]  Tobacco Use  . Smoking status: Former Smoker    Packs/day: 0.25    Years: 12.00    Pack years: 3.00    Types: Cigarettes    Quit date: 06/16/2017    Years since quitting: 2.8  . Smokeless tobacco: Never Used  Vaping Use  . Vaping Use: Never used  Substance and Sexual Activity  . Alcohol use: Yes    Alcohol/week: 2.0 - 3.0 standard drinks    Types: 2 - 3 Cans of beer per week    Comment: once a week  . Drug use: No    Comment: last used 03/2017  . Sexual activity: Yes    Partners: Male    Birth control/protection: I.U.D.    Comment: plans paraguard after delivery  Other Topics Concern  . Not on file  Social History Narrative  . Not on file   Social  Determinants of Health   Financial Resource Strain: Low Risk   . Difficulty of Paying Living Expenses: Not hard at all  Food Insecurity: No Food Insecurity  . Worried About Charity fundraiser in the Last Year: Never true  . Ran Out of Food in the Last Year: Never true  Transportation Needs: No Transportation Needs  . Lack of Transportation (Medical): No  . Lack of Transportation (Non-Medical): No  Physical Activity: Sufficiently Active  . Days of Exercise per Week: 3 days  . Minutes of Exercise per Session: 60 min  Stress: Stress Concern Present  . Feeling of Stress : Very much  Social Connections: Moderately Isolated  . Frequency of Communication with Friends and Family: More than three times a week  . Frequency of Social Gatherings with Friends and Family: More than three times a week  . Attends Religious Services: Never  . Active Member of Clubs or Organizations: No  . Attends Archivist Meetings: Never  . Marital Status: Living with partner  Intimate Partner Violence: Not At Risk  . Fear of Current or Ex-Partner: No  . Emotionally Abused: No  . Physically Abused: No  . Sexually Abused: No    Current Outpatient Medications on File Prior to Visit  Medication Sig Dispense Refill  . Adapalene (DIFFERIN) 0.3 % gel Apply topically at bedtime 45 g 6  . albuterol (PROVENTIL HFA;VENTOLIN HFA) 108 (90 Base) MCG/ACT inhaler Inhale into the lungs every 6 (six) hours as needed for wheezing or shortness of breath.    . ARIPiprazole (ABILIFY) 2 MG tablet Take 1 tablet (2 mg total) by mouth daily. 90 tablet 1  . buPROPion (WELLBUTRIN XL) 150 MG 24 hr tablet Take 1 tablet (150 mg total) by mouth daily. 90 tablet 1  . busPIRone (BUSPAR) 10 MG tablet Take 1 tablet (10 mg total) by mouth 2 (two) times daily. 180 tablet 1  . Dapsone (ACZONE) 7.5 % GEL Apply 1 application topically in the morning. 60 g 2  . Dapsone 7.5 % GEL Apply 1 application topically daily.    Marland Kitchen doxycycline  (PERIOSTAT) 20 MG tablet Take 20 mg by mouth 2 (two) times daily.    . hydrOXYzine (ATARAX/VISTARIL) 25 MG tablet Take 1 tablet (25 mg total) by mouth 3 (three) times daily as needed. for anxiety 100  tablet 1  . levothyroxine (SYNTHROID) 25 MCG tablet Take 1 tablet (25 mcg total) by mouth daily before breakfast. 90 tablet 1  . Multiple Vitamin (MULTIVITAMIN) tablet Take 1 tablet by mouth daily.    . Norgestimate-Ethinyl Estradiol Triphasic 0.18/0.215/0.25 MG-25 MCG tab Take 1 tablet by mouth daily. 3 Package 4  . spironolactone (ALDACTONE) 100 MG tablet Take 100 mg by mouth daily.    . vitamin B-12 (CYANOCOBALAMIN) 500 MCG tablet Take 500 mcg by mouth daily.    . vitamin C (ASCORBIC ACID) 500 MG tablet Take 500 mg by mouth daily.     No current facility-administered medications on file prior to visit.    No Known Allergies    Review of Systems Constitutional: negative for chills, fatigue, fevers and sweats Eyes: negative for irritation, redness and visual disturbance Ears, nose, mouth, throat, and face: negative for hearing loss, nasal congestion, snoring and tinnitus Respiratory: negative for asthma, cough, sputum Cardiovascular: negative for chest pain, dyspnea, exertional chest pressure/discomfort, irregular heart beat, palpitations and syncope Gastrointestinal: negative for abdominal pain, change in bowel habits, nausea and vomiting Genitourinary:  Negative for genital lesions, sexual problems and vaginal discharge, dysuria and urinary incontinence Integument/breast: negative for breast lump, breast tenderness and nipple discharge Hematologic/lymphatic: negative for bleeding and easy bruising Musculoskeletal:negative for back pain and muscle weakness Neurological: negative for dizziness, headaches, vertigo and weakness Endocrine: negative for diabetic symptoms including polydipsia, polyuria and skin dryness Allergic/Immunologic: negative for hay fever and urticaria    Psychologic: +  for h/o anxiety, panic attacks, occasional mood swings. Stable with current medications. Negative for depression, hallucinations.      Objective:    General Appearance:    Alert, cooperative, no distress, appears stated age  Head:    Normocephalic, without obvious abnormality, atraumatic  Eyes:    PERRL, conjunctiva/corneas clear, EOM's intact, both eyes  Ears:    Normal external ear canals, both ears  Nose:   Nares normal, septum midline, mucosa normal, no drainage or sinus tenderness  Throat:   Lips, mucosa, and tongue normal; teeth and gums normal  Neck:   Supple, symmetrical, trachea midline, no adenopathy; thyroid: no enlargement/tenderness/nodules; no carotid bruit or JVD  Back:     Symmetric, no curvature, ROM normal, no CVA tenderness  Lungs:     Clear to auscultation bilaterally, respirations unlabored  Chest Wall:    No tenderness or deformity   Heart:    Regular rate and rhythm, S1 and S2 normal, no murmur, rub or gallop  Breast Exam:    No tenderness, masses, or nipple abnormality  Abdomen:     Soft, non-tender, bowel sounds active all four quadrants, no masses, no organomegaly.    Genitalia:    Pelvic:external genitalia normal, vagina without lesions, discharge, or tenderness, rectovaginal septum  normal. Cervix normal in appearance, no cervical motion tenderness, no adnexal masses or tenderness. Uterus normal size, shape, mobile, regular contours, nontender.  Rectal:    Normal external sphincter.  No hemorrhoids appreciated. Internal exam not done.   Extremities:   Extremities normal, atraumatic, no cyanosis or edema  Pulses:   2+ and symmetric all extremities  Skin:   Skin color, texture, turgor normal, no rashes or lesions  Lymph nodes:   Cervical, supraclavicular, and axillary nodes normal  Neurologic:   CNII-XII intact, normal strength, sensation and reflexes throughout     GAD 7 : Generalized Anxiety Score 04/10/2020 07/05/2019 04/13/2019 01/01/2019  Nervous, Anxious,  on Edge 0 0 2 0  Control/stop worrying 0 2 2 0  Worry too much - different things 1 3 3  0  Trouble relaxing 0 0 3 0  Restless 0 0 0 0  Easily annoyed or irritable 0 0 0 0  Afraid - awful might happen 0 0 3 0  Total GAD 7 Score 1 5 13  0  Anxiety Difficulty - Somewhat difficult Very difficult Not difficult at all     Labs:  Lab Results  Component Value Date   WBC 6.2 04/10/2020   HGB 12.9 04/10/2020   HCT 39.4 04/10/2020   MCV 87.8 04/10/2020   PLT 317 04/10/2020    Lab Results  Component Value Date   CREATININE 1.00 04/10/2020   BUN 15 04/10/2020   NA 138 04/10/2020   K 4.6 04/10/2020   CL 103 04/10/2020   CO2 27 04/10/2020    Lab Results  Component Value Date   ALT 12 04/10/2020   AST 15 04/10/2020   BILITOT 0.3 04/10/2020    Lab Results  Component Value Date   TSH 2.87 04/10/2020     Assessment:   Encounter for well woman exam with routine gynecological exam  Acquired hypothyroidism  Panic anxiety syndrome  Contraception surveillance  Plan:     Blood tests: None ordered. Patient had labs by PCP in July 2021. Breast self exam technique reviewed and patient encouraged to perform self-exam monthly. Contraception: Continue Ortho Tri-cyclen Lo. Will renew prescription in 2 months. Discussed healthy lifestyle modifications. Will continue to eat a healthy diet and exercise.  Pap smear up to date. Pap smear due next year 2022 Hypothyroidism and panic disorder managed by PCP. Notes stability on treatment. Has completed Gardasil vaccine series.  Cannot recall exact dates (but has her  Covid vaccination complete: 2 shot series (March 15 and April 9).  RTC in 1 year for annual exam.    I have seen and examined the patient with April Greissenger,  [place] PA-S.  I have reviewed the record and concur with patient management and plan.   Rubie Maid, MD [facility]    Rubie Maid, MD [facility]

## 2020-05-04 NOTE — Patient Instructions (Addendum)
Preventive Care 21-34 Years Old, Female Preventive care refers to visits with your health care provider and lifestyle choices that can promote health and wellness. This includes:  A yearly physical exam. This may also be called an annual well check.  Regular dental visits and eye exams.  Immunizations.  Screening for certain conditions.  Healthy lifestyle choices, such as eating a healthy diet, getting regular exercise, not using drugs or products that contain nicotine and tobacco, and limiting alcohol use. What can I expect for my preventive care visit? Physical exam Your health care provider will check your:  Height and weight. This may be used to calculate body mass index (BMI), which tells if you are at a healthy weight.  Heart rate and blood pressure.  Skin for abnormal spots. Counseling Your health care provider may ask you questions about your:  Alcohol, tobacco, and drug use.  Emotional well-being.  Home and relationship well-being.  Sexual activity.  Eating habits.  Work and work environment.  Method of birth control.  Menstrual cycle.  Pregnancy history. What immunizations do I need?  Influenza (flu) vaccine  This is recommended every year. Tetanus, diphtheria, and pertussis (Tdap) vaccine  You may need a Td booster every 10 years. Varicella (chickenpox) vaccine  You may need this if you have not been vaccinated. Human papillomavirus (HPV) vaccine  If recommended by your health care provider, you may need three doses over 6 months. Measles, mumps, and rubella (MMR) vaccine  You may need at least one dose of MMR. You may also need a second dose. Meningococcal conjugate (MenACWY) vaccine  One dose is recommended if you are age 19-21 years and a first-year college student living in a residence hall, or if you have one of several medical conditions. You may also need additional booster doses. Pneumococcal conjugate (PCV13) vaccine  You may need  this if you have certain conditions and were not previously vaccinated. Pneumococcal polysaccharide (PPSV23) vaccine  You may need one or two doses if you smoke cigarettes or if you have certain conditions. Hepatitis A vaccine  You may need this if you have certain conditions or if you travel or work in places where you may be exposed to hepatitis A. Hepatitis B vaccine  You may need this if you have certain conditions or if you travel or work in places where you may be exposed to hepatitis B. Haemophilus influenzae type b (Hib) vaccine  You may need this if you have certain conditions. You may receive vaccines as individual doses or as more than one vaccine together in one shot (combination vaccines). Talk with your health care provider about the risks and benefits of combination vaccines. What tests do I need?  Blood tests  Lipid and cholesterol levels. These may be checked every 5 years starting at age 20.  Hepatitis C test.  Hepatitis B test. Screening  Diabetes screening. This is done by checking your blood sugar (glucose) after you have not eaten for a while (fasting).  Sexually transmitted disease (STD) testing.  BRCA-related cancer screening. This may be done if you have a family history of breast, ovarian, tubal, or peritoneal cancers.  Pelvic exam and Pap test. This may be done every 3 years starting at age 21. Starting at age 30, this may be done every 5 years if you have a Pap test in combination with an HPV test. Talk with your health care provider about your test results, treatment options, and if necessary, the need for more tests.   Follow these instructions at home: Eating and drinking   Eat a diet that includes fresh fruits and vegetables, whole grains, lean protein, and low-fat dairy.  Take vitamin and mineral supplements as recommended by your health care provider.  Do not drink alcohol if: ? Your health care provider tells you not to drink. ? You are  pregnant, may be pregnant, or are planning to become pregnant.  If you drink alcohol: ? Limit how much you have to 0-1 drink a day. ? Be aware of how much alcohol is in your drink. In the [place] one drink equals one 12 oz bottle of beer (355 mL), one 5 oz glass of wine (148 mL), or one 1 oz glass of hard liquor (44 mL). Lifestyle  Take daily care of your teeth and gums.  Stay active. Exercise for at least 30 minutes on 5 or more days each week.  Do not use any products that contain nicotine or tobacco, such as cigarettes, e-cigarettes, and chewing tobacco. If you need help quitting, ask your health care provider.  If you are sexually active, practice safe sex. Use a condom or other form of birth control (contraception) in order to prevent pregnancy and STIs (sexually transmitted infections). If you plan to become pregnant, see your health care provider for a preconception visit. What's next?  Visit your health care provider once a year for a well check visit.  Ask your health care provider how often you should have your eyes and teeth checked.  Stay up to date on all vaccines. This information is not intended to replace advice given to you by your health care provider. Make sure you discuss any questions you have with your health care provider. Document Revised: 05/14/2018 Document Reviewed: 05/14/2018 Elsevier Patient Education  2020 [employer] Breast Self-Awareness Breast self-awareness is knowing how your breasts look and feel. Doing breast self-awareness is important. It allows you to catch a breast problem early while it is still small and can be treated. All women should do breast self-awareness, including women who have had breast implants. Tell your doctor if you notice a change in your breasts. What you need:  A mirror.  A well-lit room. How to do a breast self-exam A breast self-exam is one way to learn what is normal for your breasts and to check for changes. To do a  breast self-exam: Look for changes  1. Take off all the clothes above your waist. 2. Stand in front of a mirror in a room with good lighting. 3. Put your hands on your hips. 4. Push your hands down. 5. Look at your breasts and nipples in the mirror to see if one breast or nipple looks different from the other. Check to see if: ? The shape of one breast is different. ? The size of one breast is different. ? There are wrinkles, dips, and bumps in one breast and not the other. 6. Look at each breast for changes in the skin, such as: ? Redness. ? Scaly areas. 7. Look for changes in your nipples, such as: ? Liquid around the nipples. ? Bleeding. ? Dimpling. ? Redness. ? A change in where the nipples are. Feel for changes  1. Lie on your back on the floor. 2. Feel each breast. To do this, follow these steps: ? Pick a breast to feel. ? Put the arm closest to that breast above your head. ? Use your other arm to feel the nipple area of your breast. Feel   the area with the pads of your three middle fingers by making small circles with your fingers. For the first circle, press lightly. For the second circle, press harder. For the third circle, press even harder. °? Keep making circles with your fingers at the different pressures as you move down your breast. Stop when you feel your ribs. °? Move your fingers a little toward the center of your body. °? Start making circles with your fingers again, this time going up until you reach your collarbone. °? Keep making up-and-down circles until you reach your armpit. Remember to keep using the three pressures. °? Feel the other breast in the same way. °3. Sit or stand in the tub or shower. °4. With soapy water on your skin, feel each breast the same way you did in step 2 when you were lying on the floor. °Write down what you find °Writing down what you find can help you remember what to tell your doctor. Write down: °· What is normal for each breast. °· Any  changes you find in each breast, including: °? The kind of changes you find. °? Whether you have pain. °? Size and location of any lumps. °· When you last had your menstrual period. °General tips °· Check your breasts every month. °· If you are breastfeeding, the best time to check your breasts is after you feed your baby or after you use a breast pump. °· If you get menstrual periods, the best time to check your breasts is 5-7 days after your menstrual period is over. °· With time, you will become comfortable with the self-exam, and you will begin to know if there are changes in your breasts. °Contact a doctor if you: °· See a change in the shape or size of your breasts or nipples. °· See a change in the skin of your breast or nipples, such as red or scaly skin. °· Have fluid coming from your nipples that is not normal. °· Find a lump or thick area that was not there before. °· Have pain in your breasts. °· Have any concerns about your breast health. °Summary °· Breast self-awareness includes looking for changes in your breasts, as well as feeling for changes within your breasts. °· Breast self-awareness should be done in front of a mirror in a well-lit room. °· You should check your breasts every month. If you get menstrual periods, the best time to check your breasts is 5-7 days after your menstrual period is over. °· Let your doctor know of any changes you see in your breasts, including changes in size, changes on the skin, pain or tenderness, or fluid from your nipples that is not normal. °This information is not intended to replace advice given to you by your health care provider. Make sure you discuss any questions you have with your health care provider. °Document Revised: 04/21/2018 Document Reviewed: 04/21/2018 °Elsevier Patient Education © 2020 [employer] ° °Health Maintenance, Female °Adopting a healthy lifestyle and getting preventive care are important in promoting health and wellness. Ask your  health care provider about: °· The right schedule for you to have regular tests and exams. °· Things you can do on your own to prevent diseases and keep yourself healthy. °What should I know about diet, weight, and exercise? °Eat a healthy diet ° °· Eat a diet that includes plenty of vegetables, fruits, low-fat dairy products, and lean protein. °· Do not eat a lot of foods that are high in solid   fats, added sugars, or sodium. °Maintain a healthy weight °Body mass index (BMI) is used to identify weight problems. It estimates body fat based on height and weight. Your health care provider can help determine your BMI and help you achieve or maintain a healthy weight. °Get regular exercise °Get regular exercise. This is one of the most important things you can do for your health. Most adults should: °· Exercise for at least 150 minutes each week. The exercise should increase your heart rate and make you sweat (moderate-intensity exercise). °· Do strengthening exercises at least twice a week. This is in addition to the moderate-intensity exercise. °· Spend less time sitting. Even light physical activity can be beneficial. °Watch cholesterol and blood lipids °Have your blood tested for lipids and cholesterol at 34 years of age, then have this test every 5 years. °Have your cholesterol levels checked more often if: °· Your lipid or cholesterol levels are high. °· You are older than 34 years of age. °· You are at high risk for heart disease. °What should I know about cancer screening? °Depending on your health history and family history, you may need to have cancer screening at various ages. This may include screening for: °· Breast cancer. °· Cervical cancer. °· Colorectal cancer. °· Skin cancer. °· Lung cancer. °What should I know about heart disease, diabetes, and high blood pressure? °Blood pressure and heart disease °· High blood pressure causes heart disease and increases the risk of stroke. This is more likely to  develop in people who have high blood pressure readings, are of African descent, or are overweight. °· Have your blood pressure checked: °? Every 3-5 years if you are 18-34 years of age. °? Every year if you are 40 years old or older. °Diabetes °Have regular diabetes screenings. This checks your fasting blood sugar level. Have the screening done: °· Once every three years after age 40 if you are at a normal weight and have a low risk for diabetes. °· More often and at a younger age if you are overweight or have a high risk for diabetes. °What should I know about preventing infection? °Hepatitis B °If you have a higher risk for hepatitis B, you should be screened for this virus. Talk with your health care provider to find out if you are at risk for hepatitis B infection. °Hepatitis C °Testing is recommended for: °· Everyone born from 1945 through 1965. °· Anyone with known risk factors for hepatitis C. °Sexually transmitted infections (STIs) °· Get screened for STIs, including gonorrhea and chlamydia, if: °? You are sexually active and are younger than 34 years of age. °? You are older than 34 years of age and your health care provider tells you that you are at risk for this type of infection. °? Your sexual activity has changed since you were last screened, and you are at increased risk for chlamydia or gonorrhea. Ask your health care provider if you are at risk. °· Ask your health care provider about whether you are at high risk for HIV. Your health care provider may recommend a prescription medicine to help prevent HIV infection. If you choose to take medicine to prevent HIV, you should first get tested for HIV. You should then be tested every 3 months for as long as you are taking the medicine. °Pregnancy °· If you are about to stop having your period (premenopausal) and you may become pregnant, seek counseling before you get pregnant. °· Take 400 to 800   micrograms (mcg) of folic acid every day if you become  pregnant. °· Ask for birth control (contraception) if you want to prevent pregnancy. °Osteoporosis and menopause °Osteoporosis is a disease in which the bones lose minerals and strength with aging. This can result in bone fractures. If you are 65 years old or older, or if you are at risk for osteoporosis and fractures, ask your health care provider if you should: °· Be screened for bone loss. °· Take a calcium or vitamin D supplement to lower your risk of fractures. °· Be given hormone replacement therapy (HRT) to treat symptoms of menopause. °Follow these instructions at home: °Lifestyle °· Do not use any products that contain nicotine or tobacco, such as cigarettes, e-cigarettes, and chewing tobacco. If you need help quitting, ask your health care provider. °· Do not use street drugs. °· Do not share needles. °· Ask your health care provider for help if you need support or information about quitting drugs. °Alcohol use °· Do not drink alcohol if: °? Your health care provider tells you not to drink. °? You are pregnant, may be pregnant, or are planning to become pregnant. °· If you drink alcohol: °? Limit how much you use to 0-1 drink a day. °? Limit intake if you are breastfeeding. °· Be aware of how much alcohol is in your drink. In the [place] one drink equals one 12 oz bottle of beer (355 mL), one 5 oz glass of wine (148 mL), or one 1½ oz glass of hard liquor (44 mL). °General instructions °· Schedule regular health, dental, and eye exams. °· Stay current with your vaccines. °· Tell your health care provider if: °? You often feel depressed. °? You have ever been abused or do not feel safe at home. °Summary °· Adopting a healthy lifestyle and getting preventive care are important in promoting health and wellness. °· Follow your health care provider's instructions about healthy diet, exercising, and getting tested or screened for diseases. °· Follow your health care provider's instructions on monitoring your  cholesterol and blood pressure. °This information is not intended to replace advice given to you by your health care provider. Make sure you discuss any questions you have with your health care provider. °Document Revised: 08/26/2018 Document Reviewed: 08/26/2018 °Elsevier Patient Education © 2020 [employer] ° ° ° ° °

## 2020-05-04 NOTE — Progress Notes (Signed)
Pt present for annual exam. Pt stated that she was doing well no problems.  

## 2020-05-05 ENCOUNTER — Other Ambulatory Visit: Payer: Self-pay

## 2020-05-05 DIAGNOSIS — E039 Hypothyroidism, unspecified: Secondary | ICD-10-CM

## 2020-05-07 MED ORDER — LEVOTHYROXINE SODIUM 25 MCG PO TABS: 25.0000 ug | ORAL_TABLET | Freq: Every day | ORAL | 0 refills | Status: DC

## 2020-05-08 ENCOUNTER — Encounter: Payer: Self-pay | Admitting: Family Medicine

## 2020-05-08 NOTE — Telephone Encounter (Signed)
appt scheduled for 62m out

## 2020-05-09 ENCOUNTER — Other Ambulatory Visit: Payer: Self-pay | Admitting: Family Medicine

## 2020-05-09 DIAGNOSIS — L700 Acne vulgaris: Secondary | ICD-10-CM

## 2020-05-30 ENCOUNTER — Other Ambulatory Visit: Payer: Self-pay | Admitting: Family Medicine

## 2020-05-31 ENCOUNTER — Ambulatory Visit: Payer: No Typology Code available for payment source | Admitting: Dermatology

## 2020-05-31 ENCOUNTER — Encounter: Payer: Self-pay | Admitting: Internal Medicine

## 2020-05-31 ENCOUNTER — Other Ambulatory Visit: Payer: Self-pay

## 2020-05-31 ENCOUNTER — Ambulatory Visit (INDEPENDENT_AMBULATORY_CARE_PROVIDER_SITE_OTHER): Payer: No Typology Code available for payment source | Admitting: Internal Medicine

## 2020-05-31 VITALS — BP 122/76 | HR 100 | Temp 98.3°F | Resp 16 | Ht 69.0 in | Wt 167.8 lb

## 2020-05-31 DIAGNOSIS — L03012 Cellulitis of left finger: Secondary | ICD-10-CM | POA: Diagnosis not present

## 2020-05-31 MED ORDER — CEPHALEXIN 500 MG PO CAPS: 500.0000 mg | ORAL_CAPSULE | Freq: Three times a day (TID) | ORAL | 0 refills | Status: AC

## 2020-05-31 NOTE — Progress Notes (Signed)
Patient ID: Jean Foster, female    DOB: [birth year], 34 y.o.   MRN: [id number]  PCP: Hubbard Hartshorn, FNP  Chief Complaint  Patient presents with  . Nail Problem    Left hand, ring finger, red, swollen, irritated-had a cuticle that was bothering her and now it's inflammed    Subjective:   Jean Foster is a 34 y.o. female, presents to clinic with CC of the following:  Chief Complaint  Patient presents with  . Nail Problem    Left hand, ring finger, red, swollen, irritated-had a cuticle that was bothering her and now it's inflammed    HPI:  Patient is a 34 year old female former patient of Raelyn Ensign Presents today with a finger infection concern.  She notes that a couple days ago, she had an irritation adjacent to the nail like a hangnail of her left ring finger, and she did pull the area off, and has been painful since.  It has gotten a little more red and swollen.  He works as a Marine scientist at [employer], and has continued to do so, and has not been protecting it.  There has been no marked drainage from this area.  No problems with moving the joints of the finger. She has no history of MRSA. No allergies to medicines noted.    Patient Active Problem List   Diagnosis Date Noted  . Bipolar disorder with depression ([place]) 04/10/2020  . Acquired hypothyroidism 04/30/2019  . Panic attack 04/13/2019  . Cystic acne 01/01/2019  . Human papilloma virus infection 10/02/2015      Current Outpatient Medications:  .  Adapalene (DIFFERIN) 0.3 % gel, Apply topically at bedtime, Disp: 45 g, Rfl: 6 .  albuterol (PROVENTIL HFA;VENTOLIN HFA) 108 (90 Base) MCG/ACT inhaler, Inhale into the lungs every 6 (six) hours as needed for wheezing or shortness of breath., Disp: , Rfl:  .  ARIPiprazole (ABILIFY) 2 MG tablet, Take 1 tablet (2 mg total) by mouth daily., Disp: 90 tablet, Rfl: 1 .  buPROPion (WELLBUTRIN XL) 150 MG 24 hr tablet, Take 1 tablet (150 mg total) by mouth daily., Disp: 90  tablet, Rfl: 1 .  busPIRone (BUSPAR) 10 MG tablet, TAKE 1 TABLET (10 MG TOTAL) BY MOUTH 2 (TWO) TIMES DAILY., Disp: 60 tablet, Rfl: 0 .  Dapsone (ACZONE) 7.5 % GEL, Apply 1 application topically in the morning., Disp: 60 g, Rfl: 2 .  Dapsone 7.5 % GEL, Apply 1 application topically daily., Disp: , Rfl:  .  doxycycline (PERIOSTAT) 20 MG tablet, Take 20 mg by mouth 2 (two) times daily., Disp: , Rfl:  .  hydrOXYzine (ATARAX/VISTARIL) 25 MG tablet, Take 1 tablet (25 mg total) by mouth 3 (three) times daily as needed. for anxiety, Disp: 100 tablet, Rfl: 1 .  levothyroxine (SYNTHROID) 25 MCG tablet, Take 1 tablet (25 mcg total) by mouth daily before breakfast., Disp: 90 tablet, Rfl: 0 .  Multiple Vitamin (MULTIVITAMIN) tablet, Take 1 tablet by mouth daily., Disp: , Rfl:  .  Norgestimate-Ethinyl Estradiol Triphasic 0.18/0.215/0.25 MG-25 MCG tab, Take 1 tablet by mouth daily., Disp: 3 Package, Rfl: 4 .  spironolactone (ALDACTONE) 100 MG tablet, Take 100 mg by mouth daily., Disp: , Rfl:  .  vitamin B-12 (CYANOCOBALAMIN) 500 MCG tablet, Take 500 mcg by mouth daily., Disp: , Rfl:  .  vitamin C (ASCORBIC ACID) 500 MG tablet, Take 500 mg by mouth daily., Disp: , Rfl:    No Known Allergies   Past Surgical History:  Procedure Laterality Date  . NO PAST SURGERIES    . TOOTH EXTRACTION       Family History  Problem Relation Age of Onset  . Hypertension Mother   . COPD Father   . Depression Sister   . Depression Sister   . Bipolar disorder Maternal Grandmother   . Bladder Cancer Maternal Grandfather   . Lung cancer Paternal Grandmother   . Alcoholism Paternal Grandfather   . Cirrhosis Paternal Grandfather   . Breast cancer Neg Hx   . Ovarian cancer Neg Hx   . Colon cancer Neg Hx   . Diabetes Neg Hx      Social History   Tobacco Use  . Smoking status: Former Smoker    Packs/day: 0.25    Years: 12.00    Pack years: 3.00    Types: Cigarettes    Quit date: 06/16/2017    Years since  quitting: 2.9  . Smokeless tobacco: Never Used  Substance Use Topics  . Alcohol use: Yes    Alcohol/week: 2.0 - 3.0 standard drinks    Types: 2 - 3 Cans of beer per week    Comment: once a week    With staff assistance, above reviewed with the patient today.  ROS: As per HPI, otherwise no specific complaints on a limited and focused system review   No results found for this or any previous visit (from the past 72 hour(s)).   PHQ2/9: Depression screen [facility] 2/9 05/31/2020 04/10/2020 09/13/2019 07/05/2019 04/29/2019  Decreased Interest 0 0 0 0 0  Down, Depressed, Hopeless 0 0 0 1 0  PHQ - 2 Score 0 0 0 1 0  Altered sleeping - 1 1 3 1   Tired, decreased energy - 1 1 0 0  Change in appetite - 0 0 0 0  Feeling bad or failure about yourself  - 0 0 0 0  Trouble concentrating - 0 0 0 0  Moving slowly or fidgety/restless - 0 0 0 0  Suicidal thoughts - 0 0 0 0  PHQ-9 Score - 2 2 4 1   Difficult doing work/chores - Not difficult at all Not difficult at all - Not difficult at all   PHQ-2/9 Result is neg  Fall Risk: Fall Risk  05/31/2020 09/13/2019 07/05/2019 04/29/2019 04/13/2019  Falls in the past year? 0 0 0 0 0  Number falls in past yr: 0 0 0 0 0  Injury with Fall? 0 0 0 0 0  Follow up Falls evaluation completed Falls evaluation completed Falls evaluation completed Falls evaluation completed Falls evaluation completed      Objective:   Vitals:   05/31/20 1455  BP: 122/76  Pulse: 100  Resp: 16  Temp: 98.3 F (36.8 C)  TempSrc: Oral  SpO2: 99%  Weight: 167 lb 12.8 oz (76.1 kg)  Height: 5\' 9"  (1.753 m)    Body mass index is 24.78 kg/m.  Physical Exam   NAD, masked HEENT - [place]/AT, sclera anicteric, Skin/Ext -along the lateral aspect of the nail on her left ring finger was a linear abrasion with surrounding erythema, that extended towards the DIP joint of the finger.  It did not involve the DIP joint or further proximal.  There was no large raised area of potential purulence  noted, she was tender to palpate this area, the nail was intact. She had good motion of the DIP joint and PIP joint, with no erythema of these joints, Neuro/psychiatric - affect was not flat, appropriate with conversation  Alert with normal speech    Results for orders placed or performed in visit on 04/10/20  Lipid panel  Result Value Ref Range   Cholesterol 219 (H) <200 mg/dL   HDL 56 > OR = 50 mg/dL   Triglycerides 246 (H) <150 mg/dL   LDL Cholesterol (Calc) 123 (H) mg/dL (calc)   Total CHOL/HDL Ratio 3.9 <5.0 (calc)   Non-HDL Cholesterol (Calc) 163 (H) <130 mg/dL (calc)  TSH  Result Value Ref Range   TSH 2.87 mIU/L  Hemoglobin A1c  Result Value Ref Range   Hgb A1c MFr Bld 5.1 <5.7 % of total Hgb   Mean Plasma Glucose 100 (calc)   eAG (mmol/L) 5.5 (calc)  COMPLETE METABOLIC PANEL WITH GFR  Result Value Ref Range   Glucose, Bld 88 65 - 139 mg/dL   BUN 15 7 - 25 mg/dL   Creat 1.00 0.50 - 1.10 mg/dL   GFR, Est Non African American 74 > OR = 60 mL/min/1.35m2   GFR, Est African American 86 > OR = 60 mL/min/1.72m2   BUN/Creatinine Ratio NOT APPLICABLE 6 - 22 (calc)   Sodium 138 135 - 146 mmol/L   Potassium 4.6 3.5 - 5.3 mmol/L   Chloride 103 98 - 110 mmol/L   CO2 27 20 - 32 mmol/L   Calcium 9.8 8.6 - 10.2 mg/dL   Total Protein 7.2 6.1 - 8.1 g/dL   Albumin 4.5 3.6 - 5.1 g/dL   Globulin 2.7 1.9 - 3.7 g/dL (calc)   AG Ratio 1.7 1.0 - 2.5 (calc)   Total Bilirubin 0.3 0.2 - 1.2 mg/dL   Alkaline phosphatase (APISO) 38 31 - 125 U/L   AST 15 10 - 30 U/L   ALT 12 6 - 29 U/L  CBC with Differential/Platelet  Result Value Ref Range   WBC 6.2 3.8 - 10.8 Thousand/uL   RBC 4.49 3.80 - 5.10 Million/uL   Hemoglobin 12.9 11.7 - 15.5 g/dL   HCT 39.4 35 - 45 %   MCV 87.8 80.0 - 100.0 fL   MCH 28.7 27.0 - 33.0 pg   MCHC 32.7 32.0 - 36.0 g/dL   RDW 12.4 11.0 - 15.0 %   Platelets 317 140 - 400 Thousand/uL   MPV 9.8 7.5 - 12.5 fL   Neutro Abs 3,546 1,500 - 7,800 cells/uL   Lymphs Abs  2,021 850 - 3,900 cells/uL   Absolute Monocytes 521 200 - 950 cells/uL   Eosinophils Absolute 50 15 - 500 cells/uL   Basophils Absolute 62 0 - 200 cells/uL   Neutrophils Relative % 57.2 %   Total Lymphocyte 32.6 %   Monocytes Relative 8.4 %   Eosinophils Relative 0.8 %   Basophils Relative 1.0 %  VITAMIN D 25 Hydroxy (Vit-D Deficiency, Fractures)  Result Value Ref Range   Vit D, 25-Hydroxy 33 30 - 100 ng/mL  Vitamin B12  Result Value Ref Range   Vitamin B-12 842 200 - 1,100 pg/mL       Assessment & Plan:   1. Paronychia of finger of left hand/2. Mild Cellulitis of finger of left hand Educated patient on this, and the most important treatment presently is warm soaks, rather liberally over the next few days.  Would do so in warm clean water. Emphasized protecting the area, especially at work, and can loosely covered with a Band-Aid type entity and wear gloves as needed at work to help protect.  Otherwise, not to keep covered except if concerns for exposures to dirty environments.  Will add a Keflex product-a 5-day course, due to concerns for the mild cellulitis component. Noted there is not really an area to try to incise and drain presently  We will follow-up if not improving or more problematic over time. - cephALEXin (KEFLEX) 500 MG capsule; Take 1 capsule (500 mg total) by mouth 3 (three) times daily for 5 days.  Dispense: 15 capsule; Refill: 0          Towanda Malkin, MD 05/31/20 3:15 PM

## 2020-05-31 NOTE — Patient Instructions (Signed)
Paronychia Paronychia is an infection of the skin. It happens near a fingernail or toenail. It may cause pain and swelling around the nail. In some cases, a fluid-filled bump (abscess) can form near or under the nail. Usually, this condition is not serious, and it clears up with treatment. Follow these instructions at home: Wound care  Keep the affected area clean.  Soak the fingers or toes in warm water as told by your doctor. You may be told to do this for 20 minutes, 2-3 times a day.  Keep the area dry when you are not soaking it.  Do not try to drain a fluid-filled bump on your own.  Follow instructions from your doctor about how to take care of the affected area. Make sure you: ? Wash your hands with soap and water before you change your bandage (dressing). If you cannot use soap and water, use hand sanitizer. ? Change your bandage as told by your doctor.  If you had a fluid-filled bump and your doctor drained it, check the area every day for signs of infection. Check for: ? Redness, swelling, or pain. ? Fluid or blood. ? Warmth. ? Pus or a bad smell. Medicines   Take over-the-counter and prescription medicines only as told by your doctor.  If you were prescribed an antibiotic medicine, take it as told by your doctor. Do not stop taking it even if you start to feel better. General instructions  Avoid touching any chemicals.  Do not pick at the affected area. Prevention  To prevent this condition from happening again: ? Wear rubber gloves when putting your hands in water for washing dishes or other tasks. ? Wear gloves if your hands might touch cleaners or chemicals. ? Avoid injuring your nails or fingertips. ? Do not bite your nails or tear hangnails. ? Do not cut your nails very short. ? Do not cut the skin at the base and sides of the nail (cuticles). ? Use clean nail clippers or scissors when trimming nails. Contact a doctor if:  You feel worse.  You do not get  better.  You have more fluid, blood, or pus coming from the affected area.  Your finger or knuckle is swollen or is hard to move. Get help right away if you have:  A fever or chills.  Redness spreading from the affected area.  Pain in a joint or muscle. Summary  Paronychia is an infection of the skin. It happens near a fingernail or toenail.  This condition may cause pain and swelling around the nail.  Soak the fingers or toes in warm water as told by your doctor.  Usually, this condition is not serious, and it clears up with treatment. This information is not intended to replace advice given to you by your health care provider. Make sure you discuss any questions you have with your health care provider. Document Revised: 09/19/2017 Document Reviewed: 09/15/2017 Elsevier Patient Education  2020 Elsevier Inc.  

## 2020-06-01 ENCOUNTER — Ambulatory Visit: Payer: No Typology Code available for payment source | Admitting: Dermatology

## 2020-06-23 ENCOUNTER — Other Ambulatory Visit: Payer: Self-pay

## 2020-06-23 ENCOUNTER — Other Ambulatory Visit: Payer: Self-pay | Admitting: Obstetrics and Gynecology

## 2020-06-23 MED ORDER — NORGESTIM-ETH ESTRAD TRIPHASIC 0.18/0.215/0.25 MG-25 MCG PO TABS: 1.0000 | ORAL_TABLET | Freq: Every day | ORAL | 4 refills | Status: DC

## 2020-07-05 ENCOUNTER — Ambulatory Visit: Payer: No Typology Code available for payment source | Admitting: Dermatology

## 2020-07-31 ENCOUNTER — Ambulatory Visit: Payer: No Typology Code available for payment source | Admitting: Family Medicine

## 2020-08-03 ENCOUNTER — Other Ambulatory Visit: Payer: Self-pay | Admitting: Family Medicine

## 2020-08-03 ENCOUNTER — Other Ambulatory Visit: Payer: Self-pay | Admitting: Dermatology

## 2020-08-03 DIAGNOSIS — E039 Hypothyroidism, unspecified: Secondary | ICD-10-CM

## 2020-08-07 ENCOUNTER — Other Ambulatory Visit: Payer: Self-pay | Admitting: Dermatology

## 2020-08-17 ENCOUNTER — Ambulatory Visit: Payer: No Typology Code available for payment source | Admitting: Dermatology

## 2020-08-17 ENCOUNTER — Other Ambulatory Visit: Payer: Self-pay | Admitting: Dermatology

## 2020-08-17 ENCOUNTER — Other Ambulatory Visit: Payer: Self-pay

## 2020-08-17 VITALS — BP 105/69

## 2020-08-17 DIAGNOSIS — L700 Acne vulgaris: Secondary | ICD-10-CM | POA: Diagnosis not present

## 2020-08-17 DIAGNOSIS — L708 Other acne: Secondary | ICD-10-CM

## 2020-08-17 MED ORDER — DAPSONE 7.5 % EX GEL: 1.0000 "application " | Freq: Every morning | CUTANEOUS | 5 refills | Status: DC

## 2020-08-17 MED ORDER — SPIRONOLACTONE 100 MG PO TABS: 100.0000 mg | ORAL_TABLET | Freq: Every day | ORAL | 5 refills | Status: DC

## 2020-08-17 MED ORDER — DOXYCYCLINE HYCLATE 20 MG PO TABS: 20.0000 mg | ORAL_TABLET | Freq: Two times a day (BID) | ORAL | 5 refills | Status: DC

## 2020-08-17 MED ORDER — ADAPALENE 0.3 % EX GEL: CUTANEOUS | 5 refills | Status: DC

## 2020-08-17 NOTE — Progress Notes (Signed)
   Follow-Up Visit   Subjective  Jean Foster is a 34 y.o. female who presents for the following: Acne (6 mo ance f/u. Pt treating with aczone 7.5% gel qd, adapalene 0.3% gel qd, doxycycline 20 mg BID, and spironolactone 100 mg qhs. ).  Pt states that when decreasing doxycycline 20 mg from BID to QD she had breakouts. She received ILK injection at last visit and increased back to 20 mg BID and has been doing well since.   The following portions of the chart were reviewed this encounter and updated as appropriate:  Tobacco  Allergies  Meds  Problems  Med Hx  Surg Hx  Fam Hx      Review of Systems: No other skin or systemic complaints except as noted in HPI or Assessment and Plan.   Objective  Well appearing patient in no apparent distress; mood and affect are within normal limits.  A focused examination was performed including face, chest, and back. Relevant physical exam findings are noted in the Assessment and Plan.  Objective  face, back, and chest: Rare open comedone at the face.  Clear at back and chest.   Assessment & Plan  Acne vulgaris face, back, and chest  Chronic, well controlled.   BP taken in office today: 105/69  Continue aczone 7.5% gel QD Continue adapalene 0.3% gel QD Continue doxycycline 20 mg BID Continue spironolactone 100 mg QHS   Doxycycline should be taken with food to prevent nausea. Do not lay down for 30 minutes after taking. Be cautious with sun exposure and use good sun protection while on this medication. Pregnant women should not take this medication.   Spironolactone can cause increased urination and cause blood pressure to decrease. Please watch for signs of lightheadedness and be cautious when changing position. It can sometimes cause breast tenderness or an irregular period in premenopausal women. It can also increase potassium. The increase in potassium usually is not a concern unless you are taking other medicines that also increase  potassium, so please be sure your doctor knows all of the other medications you are taking. This medication should not be taken by pregnant women.  This medicine should also not be taken together with sulfa drugs like Bactrim (trimethoprim/sulfamethexazole).        Dapsone (ACZONE) 7.5 % GEL - face, back, and chest  Adapalene (DIFFERIN) 0.3 % gel - face, back, and chest  doxycycline (PERIOSTAT) 20 MG tablet - face, back, and chest  spironolactone (ALDACTONE) 100 MG tablet - face, back, and chest  Acne cystica  Other Related Medications doxycycline (PERIOSTAT) 20 MG tablet  Return in about 6 months (around 02/15/2021) for acne.   I, Harriett Sine, CMA, am acting as scribe for Forest Gleason, MD.  Documentation: I have reviewed the above documentation for accuracy and completeness, and I agree with the above.  Forest Gleason, MD

## 2020-08-17 NOTE — Patient Instructions (Addendum)
Recommend taking Heliocare sun protection supplement daily in sunny weather for additional sun protection. For maximum protection on the sunniest days, you can take up to 2 capsules of regular Heliocare OR take 1 capsule of Heliocare Ultra. For prolonged exposure (such as a full day in the sun), you can repeat your dose of the supplement 4 hours after your first dose. Heliocare can be purchased at [facility] or at [email or web address].    Recommend daily broad spectrum sunscreen SPF 30+ to sun-exposed areas, reapply every 2 hours as needed. Call for new or changing lesions.

## 2020-09-13 ENCOUNTER — Encounter: Payer: Self-pay | Admitting: Dermatology

## 2020-09-26 NOTE — Progress Notes (Signed)
Name: Jean Foster   MRN: [id number]    DOB: 05/30/1986   Date:09/27/2020       Progress Note  Subjective  Chief Complaint  Medication Refill  HPI  Hypothyroid - recent labs showed normal TSH; taking 74mcg daily , no change in bowel movements, dry skin or palpitation. Denies dysphagia, we will continue current dose   MDD and GAD with panic attacks: she states she has a long history of depression, diagnosed in college - she was at [employer]. She states symptoms were severe and had to take medications in College - she states initially diagnosed with bipolar depression. She had highs and lows, she had suicidal thoughts , she saw a psychiatrist in the past , went a period of time without medication but resumed medication. Only on wellbutrin, she states not manic episodes since college she has been only depressed . She states she tried lexapro but it caused sexual dysfunction . She is taking Buspar once a day and Hydroxyzine mostly once a day but at times twice a day, she has been stressed at work ,overwhelmed at work but coping well with medication.   Acne: doing well on topical medication and doxy plus spironolactone, she is very pleased with results   Patient Active Problem List   Diagnosis Date Noted  . Bipolar disorder with depression ([place]) 04/10/2020  . Acquired hypothyroidism 04/30/2019  . Panic attack 04/13/2019  . Cystic acne 01/01/2019  . Human papilloma virus infection 10/02/2015    Past Surgical History:  Procedure Laterality Date  . NO PAST SURGERIES    . TOOTH EXTRACTION      Family History  Problem Relation Age of Onset  . Hypertension Mother   . COPD Father   . Depression Sister   . Depression Sister   . Bipolar disorder Maternal Grandmother   . Bladder Cancer Maternal Grandfather   . Lung cancer Paternal Grandmother   . Alcoholism Paternal Grandfather   . Cirrhosis Paternal Grandfather   . Breast cancer Neg Hx   . Ovarian cancer Neg Hx   . Colon cancer  Neg Hx   . Diabetes Neg Hx     Social History   Tobacco Use  . Smoking status: Former Smoker    Packs/day: 0.25    Years: 12.00    Pack years: 3.00    Types: Cigarettes    Quit date: 06/16/2017    Years since quitting: 3.2  . Smokeless tobacco: Never Used  Substance Use Topics  . Alcohol use: Yes    Alcohol/week: 2.0 - 3.0 standard drinks    Types: 2 - 3 Cans of beer per week    Comment: once a week     Current Outpatient Medications:  .  Adapalene (DIFFERIN) 0.3 % gel, Apply topically at bedtime, Disp: 45 g, Rfl: 5 .  albuterol (PROVENTIL HFA;VENTOLIN HFA) 108 (90 Base) MCG/ACT inhaler, Inhale into the lungs every 6 (six) hours as needed for wheezing or shortness of breath., Disp: , Rfl:  .  Dapsone (ACZONE) 7.5 % GEL, Apply 1 application topically in the morning., Disp: 60 g, Rfl: 5 .  doxycycline (PERIOSTAT) 20 MG tablet, Take 20 mg by mouth 2 (two) times daily., Disp: , Rfl:  .  Multiple Vitamin (MULTIVITAMIN) tablet, Take 1 tablet by mouth daily., Disp: , Rfl:  .  Norgestimate-Ethinyl Estradiol Triphasic 0.18/0.215/0.25 MG-25 MCG tab, Take 1 tablet by mouth daily., Disp: 28 tablet, Rfl: 4 .  spironolactone (ALDACTONE) 100 MG tablet, Take  1 tablet (100 mg total) by mouth daily., Disp: 30 tablet, Rfl: 5 .  vitamin B-12 (CYANOCOBALAMIN) 500 MCG tablet, Take 500 mcg by mouth daily., Disp: , Rfl:  .  vitamin C (ASCORBIC ACID) 500 MG tablet, Take 500 mg by mouth daily., Disp: , Rfl:  .  ARIPiprazole (ABILIFY) 2 MG tablet, Take 1 tablet (2 mg total) by mouth daily., Disp: 90 tablet, Rfl: 1 .  buPROPion (WELLBUTRIN XL) 150 MG 24 hr tablet, Take 1 tablet (150 mg total) by mouth daily., Disp: 90 tablet, Rfl: 1 .  busPIRone (BUSPAR) 10 MG tablet, Take 1 tablet (10 mg total) by mouth 2 (two) times daily as needed., Disp: 100 tablet, Rfl: 1 .  hydrOXYzine (ATARAX/VISTARIL) 25 MG tablet, Take 1 tablet (25 mg total) by mouth 3 (three) times daily as needed. for anxiety, Disp: 100 tablet,  Rfl: 1 .  levothyroxine (SYNTHROID) 25 MCG tablet, Take 1 tablet (25 mcg total) by mouth daily before breakfast., Disp: 90 tablet, Rfl: 1  No Known Allergies  I personally reviewed active problem list, medication list, allergies, family history, social history, health maintenance with the patient/caregiver today.   ROS  Constitutional: Negative for fever or weight change.  Respiratory: Negative for cough and shortness of breath.   Cardiovascular: Negative for chest pain or palpitations.  Gastrointestinal: Negative for abdominal pain, no bowel changes.  Musculoskeletal: Negative for gait problem or joint swelling.  Skin: Negative for rash.  Neurological: Negative for dizziness or headache.  No other specific complaints in a complete review of systems (except as listed in HPI above).  Objective  Vitals:   09/27/20 1443  BP: 118/76  Pulse: 78  Resp: 16  Temp: 98.2 F (36.8 C)  SpO2: 94%  Weight: 163 lb 1.6 oz (74 kg)  Height: 5\' 9"  (1.753 m)    Body mass index is 24.09 kg/m.  Physical Exam  Constitutional: Patient appears well-developed and well-nourished.No distress.  HEENT: head atraumatic, normocephalic, pupils equal and reactive to light,neck supple Cardiovascular: Normal rate, regular rhythm and normal heart sounds.  No murmur heard. No BLE edema. Pulmonary/Chest: Effort normal and breath sounds normal. No respiratory distress. Abdominal: Soft.  There is no tenderness. Psychiatric: Patient has a normal mood and affect. behavior is normal. Judgment and thought content normal.  PHQ2/9: Depression screen [facility] 2/9 09/27/2020 05/31/2020 04/10/2020 09/13/2019 07/05/2019  Decreased Interest 0 0 0 0 0  Down, Depressed, Hopeless 0 0 0 0 1  PHQ - 2 Score 0 0 0 0 1  Altered sleeping 0 - 1 1 3   Tired, decreased energy 0 - 1 1 0  Change in appetite 0 - 0 0 0  Feeling bad or failure about yourself  0 - 0 0 0  Trouble concentrating 0 - 0 0 0  Moving slowly or fidgety/restless 0  - 0 0 0  Suicidal thoughts 0 - 0 0 0  PHQ-9 Score 0 - 2 2 4   Difficult doing work/chores Not difficult at all - Not difficult at all Not difficult at all -    phq 9 is negative   Fall Risk: Fall Risk  09/27/2020 05/31/2020 09/13/2019 07/05/2019 04/29/2019  Falls in the past year? 0 0 0 0 0  Number falls in past yr: 0 0 0 0 0  Injury with Fall? 0 0 0 0 0  Follow up - Falls evaluation completed Falls evaluation completed Falls evaluation completed Falls evaluation completed     Functional Status Survey: Is the patient  deaf or have difficulty hearing?: No Does the patient have difficulty seeing, even when wearing glasses/contacts?: No Does the patient have difficulty concentrating, remembering, or making decisions?: No Does the patient have difficulty walking or climbing stairs?: No Does the patient have difficulty dressing or bathing?: No Does the patient have difficulty doing errands alone such as visiting a doctor's office or shopping?: No    Assessment & Plan  1. Bipolar disorder with depression ([place])  - buPROPion (WELLBUTRIN XL) 150 MG 24 hr tablet; Take 1 tablet (150 mg total) by mouth daily.  Dispense: 90 tablet; Refill: 1 - ARIPiprazole (ABILIFY) 2 MG tablet; Take 1 tablet (2 mg total) by mouth daily.  Dispense: 90 tablet; Refill: 1 - busPIRone (BUSPAR) 10 MG tablet; Take 1 tablet (10 mg total) by mouth 2 (two) times daily as needed.  Dispense: 100 tablet; Refill: 1 - hydrOXYzine (ATARAX/VISTARIL) 25 MG tablet; Take 1 tablet (25 mg total) by mouth 3 (three) times daily as needed. for anxiety  Dispense: 100 tablet; Refill: 1  2. Panic attack  - buPROPion (WELLBUTRIN XL) 150 MG 24 hr tablet; Take 1 tablet (150 mg total) by mouth daily.  Dispense: 90 tablet; Refill: 1 - busPIRone (BUSPAR) 10 MG tablet; Take 1 tablet (10 mg total) by mouth 2 (two) times daily as needed.  Dispense: 100 tablet; Refill: 1 - hydrOXYzine (ATARAX/VISTARIL) 25 MG tablet; Take 1 tablet (25 mg total) by  mouth 3 (three) times daily as needed. for anxiety  Dispense: 100 tablet; Refill: 1  3. Acquired hypothyroidism  - levothyroxine (SYNTHROID) 25 MCG tablet; Take 1 tablet (25 mcg total) by mouth daily before breakfast.  Dispense: 90 tablet; Refill: 1  4. Acne cystica  Doing very well with medication

## 2020-09-27 ENCOUNTER — Other Ambulatory Visit: Payer: Self-pay

## 2020-09-27 ENCOUNTER — Ambulatory Visit (INDEPENDENT_AMBULATORY_CARE_PROVIDER_SITE_OTHER): Payer: No Typology Code available for payment source | Admitting: Family Medicine

## 2020-09-27 ENCOUNTER — Other Ambulatory Visit: Payer: Self-pay | Admitting: Family Medicine

## 2020-09-27 ENCOUNTER — Encounter: Payer: Self-pay | Admitting: Family Medicine

## 2020-09-27 VITALS — BP 118/76 | HR 78 | Temp 98.2°F | Resp 16 | Ht 69.0 in | Wt 163.1 lb

## 2020-09-27 DIAGNOSIS — F319 Bipolar disorder, unspecified: Secondary | ICD-10-CM | POA: Diagnosis not present

## 2020-09-27 DIAGNOSIS — F410 Panic disorder [episodic paroxysmal anxiety] without agoraphobia: Secondary | ICD-10-CM | POA: Diagnosis not present

## 2020-09-27 DIAGNOSIS — E039 Hypothyroidism, unspecified: Secondary | ICD-10-CM | POA: Diagnosis not present

## 2020-09-27 DIAGNOSIS — L700 Acne vulgaris: Secondary | ICD-10-CM | POA: Diagnosis not present

## 2020-09-27 MED ORDER — LEVOTHYROXINE SODIUM 25 MCG PO TABS: 25.0000 ug | ORAL_TABLET | Freq: Every day | ORAL | 1 refills | Status: DC

## 2020-09-27 MED ORDER — BUPROPION HCL ER (XL) 150 MG PO TB24: 150.0000 mg | ORAL_TABLET | Freq: Every day | ORAL | 1 refills | Status: DC

## 2020-09-27 MED ORDER — HYDROXYZINE HCL 25 MG PO TABS: 25.0000 mg | ORAL_TABLET | Freq: Three times a day (TID) | ORAL | 1 refills | Status: DC | PRN

## 2020-09-27 MED ORDER — ARIPIPRAZOLE 2 MG PO TABS: 2.0000 mg | ORAL_TABLET | Freq: Every day | ORAL | 1 refills | Status: DC

## 2020-09-27 MED ORDER — BUSPIRONE HCL 10 MG PO TABS: 10.0000 mg | ORAL_TABLET | Freq: Two times a day (BID) | ORAL | 1 refills | Status: DC | PRN

## 2020-10-12 ENCOUNTER — Ambulatory Visit: Payer: No Typology Code available for payment source | Admitting: Dermatology

## 2020-10-25 ENCOUNTER — Ambulatory Visit: Payer: No Typology Code available for payment source | Admitting: Dermatology

## 2020-10-25 ENCOUNTER — Other Ambulatory Visit: Payer: Self-pay

## 2020-10-25 VITALS — BP 105/66 | HR 84

## 2020-10-25 DIAGNOSIS — L700 Acne vulgaris: Secondary | ICD-10-CM | POA: Diagnosis not present

## 2020-10-25 NOTE — Progress Notes (Signed)
Follow-Up Visit   Subjective  Jean Foster is a 35 y.o. female who presents for the following: Acne (Patient has a cystic nodule on her right chin that is painful and she would like an ILK injection into nodule. She continues to use Adapalene 0.3% gel QHS, Aczone 7.5% gel QAM, Spironolactone 100mg  po QHS, and Doxycycline 20mg  po BID).  The following portions of the chart were reviewed this encounter and updated as appropriate:   Tobacco  Allergies  Meds  Problems  Med Hx  Surg Hx  Fam Hx     Review of Systems:  No other skin or systemic complaints except as noted in HPI or Assessment and Plan.  Objective  Well appearing patient in no apparent distress; mood and affect are within normal limits.  A focused examination was performed including the face. Relevant physical exam findings are noted in the Assessment and Plan.  Objective  The face: Cystic nodule of the right chin  Assessment & Plan  Acne vulgaris with new acne nodule The face Chronic and persistent. Not to goal. New acne painful nodule on the right chin. Continue: Spironolactone 100mg  po QHS.  Doxycycline 20 mg twice daily Adapalene 0.3% gel nightly Aczone 7.5% gel every morning  Intralesional steroid injection into the right chin nodule today.  Spironolactone can cause increased urination and cause blood pressure to decrease. Please watch for signs of lightheadedness and be cautious when changing position. It can sometimes cause breast tenderness or an irregular period in premenopausal women. It can also increase potassium. The increase in potassium usually is not a concern unless you are taking other medicines that also increase potassium, so please be sure your doctor knows all of the other medications you are taking. This medication should not be taken by pregnant women.  This medicine should also not be taken together with sulfa drugs like Bactrim (trimethoprim/sulfamethexazole).   Continue Doxycycline 20mg   po BID.  Doxycycline should be taken with food to prevent nausea. Do not lay down for 30 minutes after taking. Be cautious with sun exposure and use good sun protection while on this medication. Pregnant women should not take this medication.   Continue Adapalene 0.3% gel QHS.  Topical retinoid medications like tretinoin/Retin-A, adapalene/Differin, tazarotene/Fabior, and Epiduo/Epiduo Forte can cause dryness and irritation when first started. Only apply a pea-sized amount to the entire affected area. Avoid applying it around the eyes, edges of mouth and creases at the nose. If you experience irritation, use a good moisturizer first and/or apply the medicine less often. If you are doing well with the medicine, you can increase how often you use it until you are applying every night. Be careful with sun protection while using this medication as it can make you sensitive to the sun. This medicine should not be used by pregnant women.   Continue Aczone 7.5% gel QAM.   Intralesional injection - The face Location: R chin  Informed Consent: Discussed risks (infection, pain, bleeding, bruising, thinning of the skin, loss of skin pigment, lack of resolution, and recurrence of lesion) and benefits of the procedure, as well as the alternatives. Informed consent was obtained. Preparation: The area was prepared a standard fashion.  Procedure Details: An intralesional injection was performed with Kenalog 1.25 mg/cc. 0.05 cc in total were injected.  Total number of injections: 1  Plan: The patient was instructed on post-op care. Recommend OTC analgesia as needed for pain.  Other Related Medications Dapsone (ACZONE) 7.5 % GEL Adapalene (DIFFERIN) 0.3 %  gel spironolactone (ALDACTONE) 100 MG tablet  Return for appointment as scheduled.  Luther Redo, CMA, am acting as scribe for Sarina Ser, MD .  Documentation: I have reviewed the above documentation for accuracy and completeness, and I agree with  the above.  Sarina Ser, MD

## 2020-10-29 ENCOUNTER — Encounter: Payer: Self-pay | Admitting: Dermatology

## 2020-11-02 ENCOUNTER — Other Ambulatory Visit: Payer: Self-pay | Admitting: Family Medicine

## 2020-11-02 ENCOUNTER — Other Ambulatory Visit: Payer: Self-pay | Admitting: Obstetrics and Gynecology

## 2020-11-02 DIAGNOSIS — E039 Hypothyroidism, unspecified: Secondary | ICD-10-CM

## 2020-11-15 ENCOUNTER — Other Ambulatory Visit: Payer: Self-pay | Admitting: Dermatology

## 2020-11-15 ENCOUNTER — Ambulatory Visit: Payer: No Typology Code available for payment source | Admitting: Dermatology

## 2020-11-15 ENCOUNTER — Other Ambulatory Visit: Payer: Self-pay

## 2020-11-15 DIAGNOSIS — L700 Acne vulgaris: Secondary | ICD-10-CM

## 2020-11-15 MED ORDER — DOXYCYCLINE HYCLATE 100 MG PO TABS: 100.0000 mg | ORAL_TABLET | Freq: Every day | ORAL | 1 refills | Status: DC

## 2020-11-15 NOTE — Patient Instructions (Signed)
Doxycycline should be taken with food to prevent nausea. Do not lay down for 30 minutes after taking. Be cautious with sun exposure and use good sun protection while on this medication. Pregnant women should not take this medication.   Topical retinoid medications like tretinoin/Retin-A, adapalene/Differin, tazarotene/Fabior, and Epiduo/Epiduo Forte can cause dryness and irritation when first started. Only apply a pea-sized amount to the entire affected area. Avoid applying it around the eyes, edges of mouth and creases at the nose. If you experience irritation, use a good moisturizer first and/or apply the medicine less often. If you are doing well with the medicine, you can increase how often you use it until you are applying every night. Be careful with sun protection while using this medication as it can make you sensitive to the sun. This medicine should not be used by pregnant women.   Spironolactone can cause increased urination and cause blood pressure to decrease. Please watch for signs of lightheadedness and be cautious when changing position. It can sometimes cause breast tenderness or an irregular period in premenopausal women. It can also increase potassium. The increase in potassium usually is not a concern unless you are taking other medicines that also increase potassium, so please be sure your doctor knows all of the other medications you are taking. This medication should not be taken by pregnant women.  This medicine should also not be taken together with sulfa drugs like Bactrim (trimethoprim/sulfamethexazole).

## 2020-11-15 NOTE — Progress Notes (Signed)
Follow-Up Visit   Subjective  Jean Foster is a 35 y.o. female who presents for the following: Acne (Pt here for cortisone injection for inflamed acne bump, treating with Doxycycline 20 mg bid, Spironolactone 100 mg qhs, Dapsone gel, Adapalene gel with a good response ). He has been under stress lately which may be the reason for these new lesions of her chin.  The following portions of the chart were reviewed this encounter and updated as appropriate:   Tobacco  Allergies  Meds  Problems  Med Hx  Surg Hx  Fam Hx     Review of Systems:  No other skin or systemic complaints except as noted in HPI or Assessment and Plan.  Objective  Well appearing patient in no apparent distress; mood and affect are within normal limits.  A focused examination was performed including face. Relevant physical exam findings are noted in the Assessment and Plan.  Objective  L cheek, R chin: 1 inflamed papule at the L chin    Assessment & Plan  Acne vulgaris with flare possibly due to stress and nodules on the chin.  She would like intralesional steroid injections which is having the helped in the past. Discussed isotretinoin as a treatment options.  She may consider.  Information given. L cheek, R chin Discussed Isotretinoin a option for this type of acne, Isotretinoin pamphlet given   Increase Doxycycline  Start Doxycyline 100 mg take 1 tablet qd with food  Cont Spironolactone 100 mg qhs  Cont Adapalene gel qhs  Cont Dapsone gel qam   Doxycycline should be taken with food to prevent nausea. Do not lay down for 30 minutes after taking. Be cautious with sun exposure and use good sun protection while on this medication. Pregnant women should not take this medication.    Spironolactone can cause increased urination and cause blood pressure to decrease. Please watch for signs of lightheadedness and be cautious when changing position. It can sometimes cause breast tenderness or an irregular  period in premenopausal women. It can also increase potassium. The increase in potassium usually is not a concern unless you are taking other medicines that also increase potassium, so please be sure your doctor knows all of the other medications you are taking. This medication should not be taken by pregnant women.  This medicine should also not be taken together with sulfa drugs like Bactrim (trimethoprim/sulfamethexazole).    Topical retinoid medications like tretinoin/Retin-A, adapalene/Differin, tazarotene/Fabior, and Epiduo/Epiduo Forte can cause dryness and irritation when first started. Only apply a pea-sized amount to the entire affected area. Avoid applying it around the eyes, edges of mouth and creases at the nose. If you experience irritation, use a good moisturizer first and/or apply the medicine less often. If you are doing well with the medicine, you can increase how often you use it until you are applying every night. Be careful with sun protection while using this medication as it can make you sensitive to the sun. This medicine should not be used by pregnant women.    Intralesional injection - L cheek, R chin Location: L chin  Informed Consent: Discussed risks (infection, pain, bleeding, bruising, thinning of the skin, loss of skin pigment, lack of resolution, and recurrence of lesion) and benefits of the procedure, as well as the alternatives. Informed consent was obtained. Preparation: The area was prepared a standard fashion.  Anesthesia:none   Procedure Details: An intralesional injection was performed with Kenalog 1.25 mg/cc. 0.2 cc in total were injected.  Total number of injections: 4  Plan: The patient was instructed on post-op care. Recommend OTC analgesia as needed for pain.   Ordered Medications: doxycycline (VIBRA-TABS) 100 MG tablet  Other Related Medications Dapsone (ACZONE) 7.5 % GEL Adapalene (DIFFERIN) 0.3 % gel spironolactone (ALDACTONE) 100 MG  tablet  Return for as scheduled .  IMarye Round, CMA, am acting as scribe for Sarina Ser, MD .  Documentation: I have reviewed the above documentation for accuracy and completeness, and I agree with the above.  Sarina Ser, MD

## 2020-11-16 ENCOUNTER — Encounter: Payer: Self-pay | Admitting: Dermatology

## 2020-11-22 ENCOUNTER — Encounter: Payer: Self-pay | Admitting: Family Medicine

## 2020-11-23 ENCOUNTER — Other Ambulatory Visit: Payer: Self-pay

## 2020-11-23 DIAGNOSIS — Z975 Presence of (intrauterine) contraceptive device: Secondary | ICD-10-CM

## 2020-11-23 DIAGNOSIS — L700 Acne vulgaris: Secondary | ICD-10-CM

## 2020-12-19 ENCOUNTER — Other Ambulatory Visit: Payer: Self-pay

## 2021-01-17 MED FILL — Norgestimate-Eth Estrad Tab 0.18-25/0.215-25/0.25-25 MG-MCG: ORAL | 84 days supply | Qty: 84 | Fill #0 | Status: AC

## 2021-01-17 MED FILL — Bupropion HCl Tab ER 24HR 150 MG: ORAL | 90 days supply | Qty: 90 | Fill #0 | Status: AC

## 2021-01-17 MED FILL — Doxycycline Hyclate Tab 20 MG: ORAL | 30 days supply | Qty: 60 | Fill #0 | Status: AC

## 2021-01-17 MED FILL — Aripiprazole Tab 2 MG: ORAL | 90 days supply | Qty: 90 | Fill #0 | Status: AC

## 2021-01-17 MED FILL — Spironolactone Tab 100 MG: ORAL | 30 days supply | Qty: 30 | Fill #0 | Status: AC

## 2021-01-18 ENCOUNTER — Other Ambulatory Visit: Payer: Self-pay

## 2021-01-24 ENCOUNTER — Ambulatory Visit: Payer: No Typology Code available for payment source | Admitting: Dermatology

## 2021-01-24 ENCOUNTER — Other Ambulatory Visit: Payer: Self-pay

## 2021-01-24 DIAGNOSIS — L720 Epidermal cyst: Secondary | ICD-10-CM | POA: Diagnosis not present

## 2021-01-24 NOTE — Patient Instructions (Signed)

## 2021-01-24 NOTE — Progress Notes (Signed)
   Follow-Up Visit   Subjective  Jean Foster is a 35 y.o. female who presents for the following: Acne (Patient here today for acne injection at chin. Spot came up about 1 week ago. She is taking spironolactone 100mg  and doxycycline 40mg  daily and is using adapalene 0.3% gel and dapsone 7.5%. ).  Patient has acne follow up later this month.   The following portions of the chart were reviewed this encounter and updated as appropriate:   Tobacco  Allergies  Meds  Problems  Med Hx  Surg Hx  Fam Hx      Review of Systems:  No other skin or systemic complaints except as noted in HPI or Assessment and Plan.  Objective  Well appearing patient in no apparent distress; mood and affect are within normal limits.  A focused examination was performed including face. Relevant physical exam findings are noted in the Assessment and Plan.  Objective  right chin: Subcutaneous nodule.     Assessment & Plan  Epidermal inclusion cyst right chin  Symptomatic  Intralesional injection - right chin Location: right chin  Informed Consent: Discussed risks (infection, pain, bleeding, bruising, thinning of the skin, loss of skin pigment, lack of resolution, and recurrence of lesion) and benefits of the procedure, as well as the alternatives. Informed consent was obtained. Preparation: The area was prepared a standard fashion.  Procedure Details: An intralesional injection was performed with Kenalog 1.5 mg/cc. 0.1 cc in total were injected.  Total number of injections: 1  Plan: The patient was instructed on post-op care. Recommend OTC analgesia as needed for pain.   Return for as scheduled.  Graciella Belton, RMA, am acting as scribe for Forest Gleason, MD .  Documentation: I have reviewed the above documentation for accuracy and completeness, and I agree with the above.  Forest Gleason, MD

## 2021-02-04 ENCOUNTER — Encounter: Payer: Self-pay | Admitting: Dermatology

## 2021-02-08 ENCOUNTER — Other Ambulatory Visit: Payer: Self-pay

## 2021-02-08 ENCOUNTER — Ambulatory Visit: Payer: No Typology Code available for payment source | Admitting: Dermatology

## 2021-02-08 DIAGNOSIS — D229 Melanocytic nevi, unspecified: Secondary | ICD-10-CM | POA: Diagnosis not present

## 2021-02-08 DIAGNOSIS — L700 Acne vulgaris: Secondary | ICD-10-CM

## 2021-02-08 NOTE — Progress Notes (Signed)
   Follow-Up Visit   Subjective  Jean Foster is a 35 y.o. female who presents for the following: Acne (Patient here today for 6 month acne follow up. Currently taking spironolactone 100mg  QHS, doxycycline 20mg  BID and using adapalene 0.3% gel QHS and dapsone 7.5% QAM. Patient advises acne has improved but continues to flare about once a month with stress and worse with periods. ).  Patient tolerating medications well and not having any side effects.   BP 109/70  The following portions of the chart were reviewed this encounter and updated as appropriate:   Tobacco  Allergies  Meds  Problems  Med Hx  Surg Hx  Fam Hx      Review of Systems:  No other skin or systemic complaints except as noted in HPI or Assessment and Plan.  Objective  Well appearing patient in no apparent distress; mood and affect are within normal limits.  A focused examination was performed including face, neck, chest and back. Relevant physical exam findings are noted in the Assessment and Plan.  Objective  Face: Face with resolving inflammatory papule at right chin, cyst s/p ILK   Assessment & Plan  Acne vulgaris Face  Chronic condition with duration over one year. Condition is bothersome to patient. Not currently at goal.  Still with recurrent cysts around menses  Continue adapalene 0.3% gel QHS Continue dapsone 7.5% QAM Continue doxycycline 20mg  BID with food Pending normal potassium increase spironolactone to 100mg  QAM and 50mg  QHS  Samples of Doryx 200mg  x 2 given to patient to take daily with food for flares. Lot # [id number]   Feb 2024  Doxycycline should be taken with food to prevent nausea. Do not lay down for 30 minutes after taking. Be cautious with sun exposure and use good sun protection while on this medication. Pregnant women should not take this medication.   Spironolactone can cause increased urination and cause blood pressure to decrease. Please watch for signs of lightheadedness  and be cautious when changing position. It can sometimes cause breast tenderness or an irregular period in premenopausal women. It can also increase potassium. The increase in potassium usually is not a concern unless you are taking other medicines that also increase potassium, so please be sure your doctor knows all of the other medications you are taking. This medication should not be taken by pregnant women.  This medicine should also not be taken together with sulfa drugs like Bactrim (trimethoprim/sulfamethexazole).    Other Related Procedures Potassium  Other Related Medications Adapalene (DIFFERIN) 0.3 % gel doxycycline (VIBRA-TABS) 100 MG tablet spironolactone (ALDACTONE) 100 MG tablet doxycycline (PERIOSTAT) 20 MG tablet Dapsone 7.5 % GEL  Melanocytic Nevi - Tan-brown and/or pink-flesh-colored symmetric macules and papules - Benign appearing on exam today - Observation - Call clinic for new or changing moles - Recommend daily use of broad spectrum spf 30+ sunscreen to sun-exposed areas.   Return in about 4 weeks (around 03/08/2021) for Acne.  Graciella Belton, RMA, am acting as scribe for Forest Gleason, MD .  Documentation: I have reviewed the above documentation for accuracy and completeness, and I agree with the above.  Forest Gleason, MD

## 2021-02-08 NOTE — Patient Instructions (Addendum)
Continue adapalene 0.3% gel QHS Continue dapsone 7.5% QAM Continue doxycycline 20mg  BID with food Pending normal potassium increase spironolactone to 100mg  QAM and 50mg  QHS  Samples of Doryx 200mg  x 2 given to patient to take daily with food for flares.  Doxycycline should be taken with food to prevent nausea. Do not lay down for 30 minutes after taking. Be cautious with sun exposure and use good sun protection while on this medication. Pregnant women should not take this medication.   Spironolactone can cause increased urination and cause blood pressure to decrease. Please watch for signs of lightheadedness and be cautious when changing position. It can sometimes cause breast tenderness or an irregular period in premenopausal women. It can also increase potassium. The increase in potassium usually is not a concern unless you are taking other medicines that also increase potassium, so please be sure your doctor knows all of the other medications you are taking. This medication should not be taken by pregnant women.  This medicine should also not be taken together with sulfa drugs like Bactrim (trimethoprim/sulfamethexazole).   If you have any questions or concerns for your doctor, please call our main line at [phone] and press option 4 to reach your doctor's medical assistant. If no one answers, please leave a voicemail as directed and we will return your call as soon as possible. Messages left after 4 pm will be answered the following business day.   You may also send [place] a message via [place]. We typically respond to MyChart messages within 1-2 business days.  For prescription refills, please ask your pharmacy to contact our office. Our fax number is [phone].  If you have an urgent issue when the clinic is closed that cannot wait until the next business day, you can page your doctor at the number below.    Please note that while we do our best to be available for urgent issues outside  of office hours, we are not available 24/7.   If you have an urgent issue and are unable to reach [place], you may choose to seek medical care at your doctor's office, retail clinic, urgent care center, or emergency room.  If you have a medical emergency, please immediately call 911 or go to the emergency department.  Pager Numbers  - Dr. Nehemiah Massed: [phone]  - Dr. Laurence Ferrari: [phone]  - Dr. Nicole Kindred: [phone]  In the event of inclement weather, please call our main line at [phone] for an update on the status of any delays or closures.  Dermatology Medication Tips: Please keep the boxes that topical medications come in in order to help keep track of the instructions about where and how to use these. Pharmacies typically print the medication instructions only on the boxes and not directly on the medication tubes.   If your medication is too expensive, please contact our office at [phone] option 4 or send [place] a message through [place].   We are unable to tell what your co-pay for medications will be in advance as this is different depending on your insurance coverage. However, we may be able to find a substitute medication at lower cost or fill out paperwork to get insurance to cover a needed medication.   If a prior authorization is required to get your medication covered by your insurance company, please allow [place] 1-2 business days to complete this process.  Drug prices often vary depending on where the prescription is filled and some pharmacies may offer cheaper prices.  The website  www.goodrx.com contains coupons for medications through different pharmacies. The prices here do not account for what the cost may be with help from insurance (it may be cheaper with your insurance), but the website can give you the price if you did not use any insurance.  - You can print the associated coupon and take it with your prescription to the pharmacy.  - You may also stop by our office  during regular business hours and pick up a GoodRx coupon card.  - If you need your prescription sent electronically to a different pharmacy, notify our office through [facility] or by phone at [phone] option 4.

## 2021-02-09 LAB — POTASSIUM: Potassium: 4.9 mmol/L (ref 3.5–5.2)

## 2021-02-12 ENCOUNTER — Encounter: Payer: Self-pay | Admitting: Dermatology

## 2021-02-14 ENCOUNTER — Telehealth: Payer: Self-pay

## 2021-02-14 DIAGNOSIS — L700 Acne vulgaris: Secondary | ICD-10-CM

## 2021-02-14 MED ORDER — SPIRONOLACTONE 50 MG PO TABS
ORAL_TABLET | ORAL | 0 refills | Status: DC
  Filled 2021-02-14: qty 90, 30d supply, fill #0

## 2021-02-14 NOTE — Telephone Encounter (Signed)
-----   Message from [place], MD sent at 02/13/2021  8:46 AM EDT ----- Potassium 4.9 at higher end of normal range Will increase spironolactone as planned (to 50 mg once per day, 100 mg once per day) and recheck blood pressure and potassium in 1 month  MAs please call and send prescription. Thank you!

## 2021-02-14 NOTE — Telephone Encounter (Signed)
Patient advised labs ok, will send in spironolactone 100mg  QAM and spironolactone 50mg  QHS, JS

## 2021-02-15 ENCOUNTER — Other Ambulatory Visit: Payer: Self-pay

## 2021-02-16 ENCOUNTER — Other Ambulatory Visit: Payer: Self-pay

## 2021-02-16 ENCOUNTER — Other Ambulatory Visit: Payer: Self-pay | Admitting: Family Medicine

## 2021-02-16 DIAGNOSIS — E039 Hypothyroidism, unspecified: Secondary | ICD-10-CM

## 2021-02-16 MED ORDER — LEVOTHYROXINE SODIUM 25 MCG PO TABS
ORAL_TABLET | Freq: Every day | ORAL | 0 refills | Status: DC
  Filled 2021-02-16: qty 90, 90d supply, fill #0

## 2021-02-16 MED FILL — Spironolactone Tab 100 MG: ORAL | 30 days supply | Qty: 30 | Fill #1 | Status: AC

## 2021-02-16 MED FILL — Buspirone HCl Tab 10 MG: ORAL | 50 days supply | Qty: 100 | Fill #0 | Status: AC

## 2021-02-19 ENCOUNTER — Other Ambulatory Visit: Payer: Self-pay

## 2021-02-20 ENCOUNTER — Other Ambulatory Visit: Payer: Self-pay

## 2021-02-20 DIAGNOSIS — L700 Acne vulgaris: Secondary | ICD-10-CM

## 2021-02-20 MED ORDER — ADAPALENE 0.3 % EX GEL
CUTANEOUS | 6 refills | Status: DC
Start: 2021-02-20 — End: 2022-06-06
  Filled 2021-02-20: qty 45, 30d supply, fill #0

## 2021-02-20 NOTE — Progress Notes (Signed)
Fax prescription refill request received from pharmacy.

## 2021-02-21 ENCOUNTER — Other Ambulatory Visit: Payer: Self-pay

## 2021-03-15 ENCOUNTER — Other Ambulatory Visit: Payer: Self-pay

## 2021-03-15 ENCOUNTER — Other Ambulatory Visit: Payer: Self-pay | Admitting: Dermatology

## 2021-03-15 ENCOUNTER — Ambulatory Visit: Payer: No Typology Code available for payment source | Admitting: Dermatology

## 2021-03-15 DIAGNOSIS — L700 Acne vulgaris: Secondary | ICD-10-CM

## 2021-03-15 MED ORDER — SPIRONOLACTONE 50 MG PO TABS
ORAL_TABLET | ORAL | 0 refills | Status: DC
  Filled 2021-03-15 – 2021-03-30 (×2): qty 90, 30d supply, fill #0

## 2021-03-15 MED FILL — Hydroxyzine HCl Tab 25 MG: ORAL | 33 days supply | Qty: 100 | Fill #0 | Status: CN

## 2021-03-15 NOTE — Progress Notes (Signed)
   Follow-Up Visit   Subjective  Jean Foster is a 35 y.o. female who presents for the following: Acne (Patient here today for 1 month acne follow up. Patient using adapalene 0.3% gel qhs, dapsone 7.5% qam and taking spironolactone 150mg  daily. She was taking doxycycline 20mg  twice daily but discontinued about 2 weeks ago and acne has stayed clear. ).  BP 129/82 Patient tolerating medications well.   The following portions of the chart were reviewed this encounter and updated as appropriate:   Tobacco  Allergies  Meds  Problems  Med Hx  Surg Hx  Fam Hx      Review of Systems:  No other skin or systemic complaints except as noted in HPI or Assessment and Plan.  Objective  Well appearing patient in no apparent distress; mood and affect are within normal limits.  A focused examination was performed including face, neck, chest and back. Relevant physical exam findings are noted in the Assessment and Plan.  Head - Anterior (Face) Face, chest and back clear today   Assessment & Plan  Acne vulgaris Head - Anterior (Face)  Chronic condition with duration or expected duration over one year. Currently well-controlled.  Continue dapsone qam Continue adapalene 0.3% gel qhs Continue spironolactone 100mg  qam and 50mg  qhs May restart doxycycline as needed for flares  Topical retinoid medications like tretinoin/Retin-A, adapalene/Differin, tazarotene/Fabior, and Epiduo/Epiduo Forte can cause dryness and irritation when first started. Only apply a pea-sized amount to the entire affected area. Avoid applying it around the eyes, edges of mouth and creases at the nose. If you experience irritation, use a good moisturizer first and/or apply the medicine less often. If you are doing well with the medicine, you can increase how often you use it until you are applying every night. Be careful with sun protection while using this medication as it can make you sensitive to the sun. This medicine  should not be used by pregnant women.   Spironolactone can cause increased urination and cause blood pressure to decrease. Please watch for signs of lightheadedness and be cautious when changing position. It can sometimes cause breast tenderness or an irregular period in premenopausal women. It can also increase potassium. The increase in potassium usually is not a concern unless you are taking other medicines that also increase potassium, so please be sure your doctor knows all of the other medications you are taking. This medication should not be taken by pregnant women.  This medicine should also not be taken together with sulfa drugs like Bactrim (trimethoprim/sulfamethexazole).    Related Medications spironolactone (ALDACTONE) 100 MG tablet TAKE 1 TABLET (100 MG TOTAL) BY MOUTH DAILY.  Dapsone 7.5 % GEL APPLY TO THE AFFECTED AREA(S) IN THE MORNING  spironolactone (ALDACTONE) 50 MG tablet Take 2 tablets by mouth every morning and 1 tablet at night as directed  Adapalene (DIFFERIN) 0.3 % gel Apply a pea sized amount to the entire face QHS.  Return in about 1 year (around 03/15/2022) for Acne.  Graciella Belton, RMA, am acting as scribe for Forest Gleason, MD .  Documentation: I have reviewed the above documentation for accuracy and completeness, and I agree with the above.  Forest Gleason, MD

## 2021-03-15 NOTE — Patient Instructions (Signed)
Topical retinoid medications like tretinoin/Retin-A, adapalene/Differin, tazarotene/Fabior, and Epiduo/Epiduo Forte can cause dryness and irritation when first started. Only apply a pea-sized amount to the entire affected area. Avoid applying it around the eyes, edges of mouth and creases at the nose. If you experience irritation, use a good moisturizer first and/or apply the medicine less often. If you are doing well with the medicine, you can increase how often you use it until you are applying every night. Be careful with sun protection while using this medication as it can make you sensitive to the sun. This medicine should not be used by pregnant women.   Spironolactone can cause increased urination and cause blood pressure to decrease. Please watch for signs of lightheadedness and be cautious when changing position. It can sometimes cause breast tenderness or an irregular period in premenopausal women. It can also increase potassium. The increase in potassium usually is not a concern unless you are taking other medicines that also increase potassium, so please be sure your doctor knows all of the other medications you are taking. This medication should not be taken by pregnant women.  This medicine should also not be taken together with sulfa drugs like Bactrim (trimethoprim/sulfamethexazole).   If you have any questions or concerns for your doctor, please call our main line at [phone] and press option 4 to reach your doctor's medical assistant. If no one answers, please leave a voicemail as directed and we will return your call as soon as possible. Messages left after 4 pm will be answered the following business day.   You may also send [place] a message via [place]. We typically respond to MyChart messages within 1-2 business days.  For prescription refills, please ask your pharmacy to contact our office. Our fax number is [phone].  If you have an urgent issue when the clinic is closed that  cannot wait until the next business day, you can page your doctor at the number below.    Please note that while we do our best to be available for urgent issues outside of office hours, we are not available 24/7.   If you have an urgent issue and are unable to reach [place], you may choose to seek medical care at your doctor's office, retail clinic, urgent care center, or emergency room.  If you have a medical emergency, please immediately call 911 or go to the emergency department.  Pager Numbers  - Dr. Nehemiah Massed: [phone]  - Dr. Laurence Ferrari: [phone]  - Dr. Nicole Kindred: [phone]  In the event of inclement weather, please call our main line at [phone] for an update on the status of any delays or closures.  Dermatology Medication Tips: Please keep the boxes that topical medications come in in order to help keep track of the instructions about where and how to use these. Pharmacies typically print the medication instructions only on the boxes and not directly on the medication tubes.   If your medication is too expensive, please contact our office at [phone] option 4 or send [place] a message through [place].   We are unable to tell what your co-pay for medications will be in advance as this is different depending on your insurance coverage. However, we may be able to find a substitute medication at lower cost or fill out paperwork to get insurance to cover a needed medication.   If a prior authorization is required to get your medication covered by your insurance company, please allow [place] 1-2 business days to complete this  process.  Drug prices often vary depending on where the prescription is filled and some pharmacies may offer cheaper prices.  The website www.goodrx.com contains coupons for medications through different pharmacies. The prices here do not account for what the cost may be with help from insurance (it may be cheaper with your insurance), but the website can give you the  price if you did not use any insurance.  - You can print the associated coupon and take it with your prescription to the pharmacy.  - You may also stop by our office during regular business hours and pick up a GoodRx coupon card.  - If you need your prescription sent electronically to a different pharmacy, notify our office through [facility] or by phone at [phone] option 4.  Recommend daily broad spectrum sunscreen SPF 30+ to sun-exposed areas, reapply every 2 hours as needed. Call for new or changing lesions.  Staying in the shade or wearing long sleeves, sun glasses (UVA+UVB protection) and wide brim hats (4-inch brim around the entire circumference of the hat) are also recommended for sun protection.   Recommend taking Heliocare sun protection supplement daily in sunny weather for additional sun protection. For maximum protection on the sunniest days, you can take up to 2 capsules of regular Heliocare OR take 1 capsule of Heliocare Ultra. For prolonged exposure (such as a full day in the sun), you can repeat your dose of the supplement 4 hours after your first dose. Heliocare can be purchased at [facility] or at [email or web address].

## 2021-03-16 ENCOUNTER — Other Ambulatory Visit: Payer: Self-pay

## 2021-03-21 ENCOUNTER — Other Ambulatory Visit: Payer: Self-pay

## 2021-03-21 ENCOUNTER — Other Ambulatory Visit: Payer: Self-pay | Admitting: Dermatology

## 2021-03-21 DIAGNOSIS — L700 Acne vulgaris: Secondary | ICD-10-CM

## 2021-03-22 ENCOUNTER — Other Ambulatory Visit: Payer: Self-pay

## 2021-03-22 NOTE — Telephone Encounter (Signed)
Change in dosage

## 2021-03-26 ENCOUNTER — Other Ambulatory Visit: Payer: Self-pay

## 2021-03-27 NOTE — Progress Notes (Signed)
Name: Jean Foster   MRN: [id number]    DOB: December 30, 1985   Date:03/28/2021       Progress Note  Subjective  Chief Complaint  Follow Up  HPI  Hypothyroid -  taking 30mcg daily  , no change in bowel movements, dry skin, dysphagia  or palpitation. We will recheck labs today    Bipolar Disorder  and GAD with panic attacks: she states she has a long history of depression diagnosed with bipolar about 10 years ago ( while in [place]). She states manic episodes included being promiscuous, doing drugs - smoking weed and taking random pills.  She states symptoms were severe and had to take medications in College - she states initially diagnosed with bipolar depression. She had highs and lows, she had suicidal thoughts , she saw a psychiatrist in the past , tried to come off medication but did not do well.She states she tried lexapro but it caused sexual dysfunction . She is taking Buspar once a day and Hydroxyzine mostly once a day but at times twice a day. She is now on Wellbutrin, Abilify, and prn medications, phq 9 is normal and she feels well. 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   Patient Active Problem List   Diagnosis Date Noted   Bipolar disorder with depression ([place]) 04/10/2020   Acquired hypothyroidism 04/30/2019   Panic attack 04/13/2019   Cystic acne 01/01/2019   Human papilloma virus infection 10/02/2015    Past Surgical History:  Procedure Laterality Date   NO PAST SURGERIES     TOOTH EXTRACTION      Family History  Problem Relation Age of Onset   Hypertension Mother    COPD Father    Depression Sister    Depression Sister    Bipolar disorder Maternal Grandmother    Bladder Cancer Maternal Grandfather    Lung cancer Paternal Grandmother    Alcoholism  Paternal Grandfather    Cirrhosis Paternal Grandfather    Breast cancer Neg Hx    Ovarian cancer Neg Hx    Colon cancer Neg Hx    Diabetes Neg Hx     Social History   Tobacco Use   Smoking status: Former    Packs/day: 0.25    Years: 12.00    Pack years: 3.00    Types: Cigarettes    Quit date: 06/16/2017    Years since quitting: 3.7   Smokeless tobacco: Never  Substance Use Topics   Alcohol use: Yes    Alcohol/week: 2.0 - 3.0 standard drinks    Types: 2 - 3 Cans of beer per week    Comment: once a week     Current Outpatient Medications:    Adapalene (DIFFERIN) 0.3 % gel, Apply a pea sized amount to the entire face QHS., Disp: 45 g, Rfl: 6   albuterol (PROVENTIL HFA;VENTOLIN HFA) 108 (90 Base) MCG/ACT inhaler, Inhale into the lungs every 6 (six) hours as needed for wheezing or shortness of breath., Disp: , Rfl:    Dapsone 7.5 % GEL, APPLY TO THE AFFECTED AREA(S) IN THE MORNING, Disp: 60 g, Rfl: 5   doxycycline (PERIOSTAT) 20 MG tablet, Take 20 mg by mouth 2 (two) times daily., Disp: , Rfl:    levothyroxine (SYNTHROID) 25 MCG  tablet, TAKE 1 TABLET BY MOUTH DAILY BEFORE BREAKFAST, Disp: 90 tablet, Rfl: 0   Multiple Vitamin (MULTIVITAMIN) tablet, Take 1 tablet by mouth daily., Disp: , Rfl:    Norgestimate-Ethinyl Estradiol Triphasic 0.18/0.215/0.25 MG-25 MCG tab, TAKE 1 TABLET BY MOUTH DAILY., Disp: 84 tablet, Rfl: 2   spironolactone (ALDACTONE) 50 MG tablet, Take 2 tablets by mouth every morning and 1 tablet at night as directed, Disp: 90 tablet, Rfl: 0   vitamin B-12 (CYANOCOBALAMIN) 500 MCG tablet, Take 500 mcg by mouth daily., Disp: , Rfl:    vitamin C (ASCORBIC ACID) 500 MG tablet, Take 500 mg by mouth daily., Disp: , Rfl:    ARIPiprazole (ABILIFY) 2 MG tablet, TAKE 1 TABLET BY MOUTH DAILY., Disp: 90 tablet, Rfl: 1   buPROPion (WELLBUTRIN XL) 150 MG 24 hr tablet, TAKE 1 TABLET BY MOUTH DAILY., Disp: 90 tablet, Rfl: 1   busPIRone (BUSPAR) 10 MG tablet, TAKE 1 TABLET BY MOUTH 2  TIMES DAILY AS NEEDED., Disp: 100 tablet, Rfl: 1   hydrOXYzine (ATARAX/VISTARIL) 25 MG tablet, Take 1 tablet (25 mg total) by mouth 3 (three) times daily as needed for anxiety., Disp: 100 tablet, Rfl: 1  No Known Allergies  I personally reviewed active problem list, medication list, allergies, family history, social history with the patient/caregiver today.   ROS  Ten systems reviewed and is negative except as mentioned in HPI  Objective  Vitals:   03/28/21 1326  BP: 122/76  Pulse: 92  Resp: 16  Temp: 98.1 F (36.7 C)  TempSrc: Oral  SpO2: 97%  Weight: 152 lb (68.9 kg)  Height: 5\' 9"  (1.753 m)    Body mass index is 22.45 kg/m.  Physical Exam  Constitutional: Patient appears well-developed and well-nourished.  No distress.  HEENT: head atraumatic, normocephalic, pupils equal and reactive to light, neck supple Cardiovascular: Normal rate, regular rhythm and normal heart sounds.  No murmur heard. No BLE edema. Pulmonary/Chest: Effort normal and breath sounds normal. No respiratory distress. Abdominal: Soft.  There is no tenderness. Psychiatric: Patient has a normal mood and affect. behavior is normal. Judgment and thought content normal.   Recent Results (from the past 2160 hour(s))  Potassium     Status: None   Collection Time: 02/08/21 10:12 AM  Result Value Ref Range   Potassium 4.9 3.5 - 5.2 mmol/L      PHQ2/9: Depression screen [facility] 2/9 03/28/2021 09/27/2020 05/31/2020 04/10/2020 09/13/2019  Decreased Interest 0 0 0 0 0  Down, Depressed, Hopeless 0 0 0 0 0  PHQ - 2 Score 0 0 0 0 0  Altered sleeping 0 0 - 1 1  Tired, decreased energy 0 0 - 1 1  Change in appetite 0 0 - 0 0  Feeling bad or failure about yourself  0 0 - 0 0  Trouble concentrating 0 0 - 0 0  Moving slowly or fidgety/restless 0 0 - 0 0  Suicidal thoughts 0 0 - 0 0  PHQ-9 Score 0 0 - 2 2  Difficult doing work/chores - Not difficult at all - Not difficult at all Not difficult at all    phq 9 is  negative   Fall Risk: Fall Risk  03/28/2021 09/27/2020 05/31/2020 09/13/2019 07/05/2019  Falls in the past year? 0 0 0 0 0  Number falls in past yr: 0 0 0 0 0  Injury with Fall? 0 0 0 0 0  Follow up - - Falls evaluation completed Falls evaluation completed Falls evaluation completed  Functional Status Survey: Is the patient deaf or have difficulty hearing?: No Does the patient have difficulty seeing, even when wearing glasses/contacts?: No Does the patient have difficulty concentrating, remembering, or making decisions?: No Does the patient have difficulty walking or climbing stairs?: No Does the patient have difficulty dressing or bathing?: No Does the patient have difficulty doing errands alone such as visiting a doctor's office or shopping?: No    Assessment & Plan  1. Acquired hypothyroidism  - TSH  2. Cystic acne  Doing well at this time  3. Long-term use of high-risk medication  - COMPLETE METABOLIC PANEL WITH GFR - CBC with Differential/Platelet  4. Diabetes mellitus screening  - Hemoglobin A1c  5. Dyslipidemia  - Lipid panel  6. Bipolar disorder with depression ([place])  - ARIPiprazole (ABILIFY) 2 MG tablet; TAKE 1 TABLET BY MOUTH DAILY.  Dispense: 90 tablet; Refill: 1 - buPROPion (WELLBUTRIN XL) 150 MG 24 hr tablet; TAKE 1 TABLET BY MOUTH DAILY.  Dispense: 90 tablet; Refill: 1 - busPIRone (BUSPAR) 10 MG tablet; TAKE 1 TABLET BY MOUTH 2 TIMES DAILY AS NEEDED.  Dispense: 100 tablet; Refill: 1 - hydrOXYzine (ATARAX/VISTARIL) 25 MG tablet; Take 1 tablet (25 mg total) by mouth 3 (three) times daily as needed for anxiety.  Dispense: 100 tablet; Refill: 1  7. Panic attack  - buPROPion (WELLBUTRIN XL) 150 MG 24 hr tablet; TAKE 1 TABLET BY MOUTH DAILY.  Dispense: 90 tablet; Refill: 1 - busPIRone (BUSPAR) 10 MG tablet; TAKE 1 TABLET BY MOUTH 2 TIMES DAILY AS NEEDED.  Dispense: 100 tablet; Refill: 1 - hydrOXYzine (ATARAX/VISTARIL) 25 MG tablet; Take 1 tablet (25  mg total) by mouth 3 (three) times daily as needed for anxiety.  Dispense: 100 tablet; Refill: 1

## 2021-03-28 ENCOUNTER — Other Ambulatory Visit: Payer: Self-pay

## 2021-03-28 ENCOUNTER — Ambulatory Visit (INDEPENDENT_AMBULATORY_CARE_PROVIDER_SITE_OTHER): Payer: No Typology Code available for payment source | Admitting: Family Medicine

## 2021-03-28 ENCOUNTER — Encounter: Payer: Self-pay | Admitting: Family Medicine

## 2021-03-28 VITALS — BP 122/76 | HR 92 | Temp 98.1°F | Resp 16 | Ht 69.0 in | Wt 152.0 lb

## 2021-03-28 DIAGNOSIS — L700 Acne vulgaris: Secondary | ICD-10-CM | POA: Diagnosis not present

## 2021-03-28 DIAGNOSIS — E039 Hypothyroidism, unspecified: Secondary | ICD-10-CM | POA: Diagnosis not present

## 2021-03-28 DIAGNOSIS — F319 Bipolar disorder, unspecified: Secondary | ICD-10-CM

## 2021-03-28 DIAGNOSIS — Z79899 Other long term (current) drug therapy: Secondary | ICD-10-CM | POA: Diagnosis not present

## 2021-03-28 DIAGNOSIS — Z131 Encounter for screening for diabetes mellitus: Secondary | ICD-10-CM | POA: Diagnosis not present

## 2021-03-28 DIAGNOSIS — E785 Hyperlipidemia, unspecified: Secondary | ICD-10-CM

## 2021-03-28 DIAGNOSIS — F410 Panic disorder [episodic paroxysmal anxiety] without agoraphobia: Secondary | ICD-10-CM

## 2021-03-28 MED ORDER — BUSPIRONE HCL 10 MG PO TABS
ORAL_TABLET | ORAL | 1 refills | Status: DC
  Filled 2021-03-28: qty 100, 50d supply, fill #0
  Filled 2021-09-04: qty 100, 50d supply, fill #1

## 2021-03-28 MED ORDER — BUPROPION HCL ER (XL) 150 MG PO TB24
ORAL_TABLET | Freq: Every day | ORAL | 1 refills | Status: DC
  Filled 2021-03-28: qty 90, 90d supply, fill #0
  Filled 2021-07-23: qty 90, 90d supply, fill #1

## 2021-03-28 MED ORDER — ARIPIPRAZOLE 2 MG PO TABS
ORAL_TABLET | Freq: Every day | ORAL | 1 refills | Status: DC
Start: 2021-03-28 — End: 2021-10-16
  Filled 2021-03-28: qty 90, 90d supply, fill #0
  Filled 2021-07-23: qty 90, 90d supply, fill #1

## 2021-03-28 MED ORDER — HYDROXYZINE HCL 25 MG PO TABS
25.0000 mg | ORAL_TABLET | Freq: Three times a day (TID) | ORAL | 1 refills | Status: DC | PRN
  Filled 2021-03-28: qty 100, 34d supply, fill #0

## 2021-03-29 LAB — TSH: TSH: 2.5 mIU/L

## 2021-03-29 LAB — CBC WITH DIFFERENTIAL/PLATELET
Absolute Monocytes: 757 cells/uL (ref 200–950)
Basophils Absolute: 87 cells/uL (ref 0–200)
Basophils Relative: 1 %
Eosinophils Absolute: 87 cells/uL (ref 15–500)
Eosinophils Relative: 1 %
HCT: 37.4 % (ref 35.0–45.0)
Hemoglobin: 12.5 g/dL (ref 11.7–15.5)
Lymphs Abs: 2758 cells/uL (ref 850–3900)
MCH: 28.7 pg (ref 27.0–33.0)
MCHC: 33.4 g/dL (ref 32.0–36.0)
MCV: 86 fL (ref 80.0–100.0)
MPV: 9.1 fL (ref 7.5–12.5)
Monocytes Relative: 8.7 %
Neutro Abs: 5011 cells/uL (ref 1500–7800)
Neutrophils Relative %: 57.6 %
Platelets: 414 10*3/uL — ABNORMAL HIGH (ref 140–400)
RBC: 4.35 10*6/uL (ref 3.80–5.10)
RDW: 13 % (ref 11.0–15.0)
Total Lymphocyte: 31.7 %
WBC: 8.7 10*3/uL (ref 3.8–10.8)

## 2021-03-29 LAB — COMPLETE METABOLIC PANEL WITH GFR
AG Ratio: 1.5 (calc) (ref 1.0–2.5)
ALT: 11 U/L (ref 6–29)
AST: 14 U/L (ref 10–30)
Albumin: 4.5 g/dL (ref 3.6–5.1)
Alkaline phosphatase (APISO): 63 U/L (ref 31–125)
BUN: 13 mg/dL (ref 7–25)
CO2: 27 mmol/L (ref 20–32)
Calcium: 9.9 mg/dL (ref 8.6–10.2)
Chloride: 102 mmol/L (ref 98–110)
Creat: 0.96 mg/dL (ref 0.50–0.97)
Globulin: 3 g/dL (calc) (ref 1.9–3.7)
Glucose, Bld: 83 mg/dL (ref 65–99)
Potassium: 4.8 mmol/L (ref 3.5–5.3)
Sodium: 137 mmol/L (ref 135–146)
Total Bilirubin: 0.3 mg/dL (ref 0.2–1.2)
Total Protein: 7.5 g/dL (ref 6.1–8.1)
eGFR: 80 mL/min/{1.73_m2} (ref >=60)

## 2021-03-29 LAB — LIPID PANEL
Cholesterol: 218 mg/dL — ABNORMAL HIGH (ref <200)
HDL: 64 mg/dL (ref >=50)
LDL Cholesterol (Calc): 125 mg/dL (calc) — ABNORMAL HIGH
Non-HDL Cholesterol (Calc): 154 mg/dL (calc) — ABNORMAL HIGH (ref <130)
Total CHOL/HDL Ratio: 3.4 (calc) (ref <5.0)
Triglycerides: 169 mg/dL — ABNORMAL HIGH (ref <150)

## 2021-03-29 LAB — HEMOGLOBIN A1C
Hgb A1c MFr Bld: 5.4 % of total Hgb (ref <5.7)
Mean Plasma Glucose: 108 mg/dL
eAG (mmol/L): 6 mmol/L

## 2021-03-30 ENCOUNTER — Other Ambulatory Visit: Payer: Self-pay

## 2021-04-01 ENCOUNTER — Encounter: Payer: Self-pay | Admitting: Dermatology

## 2021-04-02 ENCOUNTER — Other Ambulatory Visit: Payer: Self-pay

## 2021-04-13 NOTE — Progress Notes (Deleted)
Name: Jean Foster   MRN: [id number]    DOB: 10-18-1985   Date:04/13/2021       Progress Note  Subjective  Chief Complaint  Annual Exam  HPI  Patient presents for annual CPE.  Diet: *** Exercise: ***   Flowsheet Row Office Visit from 05/31/2020 in [facility]  AUDIT-C Score 1      Depression: Phq 9 is  {Desc; negative/positive:13464} Depression screen [facility] 2/9 03/28/2021 09/27/2020 05/31/2020 04/10/2020 09/13/2019  Decreased Interest 0 0 0 0 0  Down, Depressed, Hopeless 0 0 0 0 0  PHQ - 2 Score 0 0 0 0 0  Altered sleeping 0 0 - 1 1  Tired, decreased energy 0 0 - 1 1  Change in appetite 0 0 - 0 0  Feeling bad or failure about yourself  0 0 - 0 0  Trouble concentrating 0 0 - 0 0  Moving slowly or fidgety/restless 0 0 - 0 0  Suicidal thoughts 0 0 - 0 0  PHQ-9 Score 0 0 - 2 2  Difficult doing work/chores - Not difficult at all - Not difficult at all Not difficult at all   Hypertension: BP Readings from Last 3 Encounters:  03/28/21 122/76  10/25/20 105/66  09/27/20 118/76   Obesity: Wt Readings from Last 3 Encounters:  03/28/21 152 lb (68.9 kg)  09/27/20 163 lb 1.6 oz (74 kg)  05/31/20 167 lb 12.8 oz (76.1 kg)   BMI Readings from Last 3 Encounters:  03/28/21 22.45 kg/m  09/27/20 24.09 kg/m  05/31/20 24.78 kg/m     Vaccines:  HPV: up to at age 83 , ask insurance if age between 29-45  Shingrix: 36-64 yo and ask insurance if covered when patient above 70 yo Pneumonia: educated and discussed with patient. Flu: educated and discussed with patient.  Hep C Screening: 04/29/19 STD testing and prevention (HIV/chl/gon/syphilis): 12/22/17 Intimate partner violence:negative Sexual History : Menstrual History/LMP/Abnormal Bleeding:  Incontinence Symptoms:   Breast cancer:  - Last Mammogram: N/A - BRCA gene screening: N/A  Osteoporosis: Discussed high calcium and vitamin D supplementation, weight bearing exercises  Cervical cancer screening:  Today  Skin cancer: Discussed monitoring for atypical lesions  Colorectal cancer: N/A   Lung cancer:  Low Dose CT Chest recommended if Age 64-80 years, 20 pack-year currently smoking OR have quit w/in 15years. Patient does not qualify.   ECG: 06/24/17  Advanced Care Planning: A voluntary discussion about advance care planning including the explanation and discussion of advance directives.  Discussed health care proxy and Living will, and the patient was able to identify a health care proxy as ***.  Patient does not have a living will at present time. If patient does have living will, I have requested they bring this to the clinic to be scanned in to their chart.  Lipids: Lab Results  Component Value Date   CHOL 218 (H) 03/28/2021   CHOL 219 (H) 04/10/2020   CHOL 191 04/29/2019   Lab Results  Component Value Date   HDL 64 03/28/2021   HDL 56 04/10/2020   HDL 48 (L) 04/29/2019   Lab Results  Component Value Date   LDLCALC 125 (H) 03/28/2021   LDLCALC 123 (H) 04/10/2020   LDLCALC 120 (H) 04/29/2019   Lab Results  Component Value Date   TRIG 169 (H) 03/28/2021   TRIG 246 (H) 04/10/2020   TRIG 115 04/29/2019   Lab Results  Component Value Date   CHOLHDL 3.4 03/28/2021  CHOLHDL 3.9 04/10/2020   CHOLHDL 4.0 04/29/2019   No results found for: LDLDIRECT  Glucose: Glucose, Bld  Date Value Ref Range Status  03/28/2021 83 65 - 99 mg/dL Final    Comment:    .            Fasting reference interval .   04/10/2020 88 65 - 139 mg/dL Final    Comment:    .        Non-fasting reference interval .   09/13/2019 76 65 - 99 mg/dL Final    Comment:    .            Fasting reference interval .     Patient Active Problem List   Diagnosis Date Noted   Bipolar disorder with depression ([place]) 04/10/2020   Acquired hypothyroidism 04/30/2019   Panic attack 04/13/2019   Cystic acne 01/01/2019   Human papilloma virus infection 10/02/2015    Past Surgical History:  Procedure  Laterality Date   NO PAST SURGERIES     TOOTH EXTRACTION      Family History  Problem Relation Age of Onset   Hypertension Mother    COPD Father    Depression Sister    Depression Sister    Bipolar disorder Maternal Grandmother    Bladder Cancer Maternal Grandfather    Lung cancer Paternal Grandmother    Alcoholism Paternal Grandfather    Cirrhosis Paternal Grandfather    Breast cancer Neg Hx    Ovarian cancer Neg Hx    Colon cancer Neg Hx    Diabetes Neg Hx     Social History   Socioeconomic History   Marital status: Divorced    Spouse name: Not on file   Number of children: 2   Years of education: 17   Highest education level: Bachelor's degree (e.g., BA, AB, BS)  Occupational History   Occupation: [employer]  Tobacco Use   Smoking status: Former    Packs/day: 0.25    Years: 12.00    Pack years: 3.00    Types: Cigarettes    Quit date: 06/16/2017    Years since quitting: 3.8   Smokeless tobacco: Never  Vaping Use   Vaping Use: Never used  Substance and Sexual Activity   Alcohol use: Yes    Alcohol/week: 2.0 - 3.0 standard drinks    Types: 2 - 3 Cans of beer per week    Comment: once a week   Drug use: No    Comment: last used 03/2017   Sexual activity: Yes    Partners: Male    Birth control/protection: Pill  Other Topics Concern   Not on file  Social History Narrative   Not on file   Social Determinants of Health   Financial Resource Strain: Not on file  Food Insecurity: Not on file  Transportation Needs: Not on file  Physical Activity: Not on file  Stress: Not on file  Social Connections: Not on file  Intimate Partner Violence: Not on file     Current Outpatient Medications:    Adapalene (DIFFERIN) 0.3 % gel, Apply a pea sized amount to the entire face QHS., Disp: 45 g, Rfl: 6   albuterol (PROVENTIL HFA;VENTOLIN HFA) 108 (90 Base) MCG/ACT inhaler, Inhale into the lungs every 6 (six) hours as needed for wheezing or shortness of breath., Disp: ,  Rfl:    ARIPiprazole (ABILIFY) 2 MG tablet, TAKE 1 TABLET BY MOUTH DAILY., Disp: 90 tablet, Rfl: 1   buPROPion (  WELLBUTRIN XL) 150 MG 24 hr tablet, TAKE 1 TABLET BY MOUTH DAILY., Disp: 90 tablet, Rfl: 1   busPIRone (BUSPAR) 10 MG tablet, TAKE 1 TABLET BY MOUTH 2 TIMES DAILY AS NEEDED., Disp: 100 tablet, Rfl: 1   Dapsone 7.5 % GEL, APPLY TO THE AFFECTED AREA(S) IN THE MORNING, Disp: 60 g, Rfl: 5   doxycycline (PERIOSTAT) 20 MG tablet, Take 20 mg by mouth 2 (two) times daily., Disp: , Rfl:    hydrOXYzine (ATARAX/VISTARIL) 25 MG tablet, Take 1 tablet (25 mg total) by mouth 3 (three) times daily as needed for anxiety., Disp: 100 tablet, Rfl: 1   levothyroxine (SYNTHROID) 25 MCG tablet, TAKE 1 TABLET BY MOUTH DAILY BEFORE BREAKFAST, Disp: 90 tablet, Rfl: 0   Multiple Vitamin (MULTIVITAMIN) tablet, Take 1 tablet by mouth daily., Disp: , Rfl:    Norgestimate-Ethinyl Estradiol Triphasic 0.18/0.215/0.25 MG-25 MCG tab, TAKE 1 TABLET BY MOUTH DAILY., Disp: 84 tablet, Rfl: 2   spironolactone (ALDACTONE) 50 MG tablet, Take 2 tablets by mouth every morning and 1 tablet at night as directed, Disp: 90 tablet, Rfl: 0   vitamin B-12 (CYANOCOBALAMIN) 500 MCG tablet, Take 500 mcg by mouth daily., Disp: , Rfl:    vitamin C (ASCORBIC ACID) 500 MG tablet, Take 500 mg by mouth daily., Disp: , Rfl:   No Known Allergies   ROS  ***  Objective  There were no vitals filed for this visit.  There is no height or weight on file to calculate BMI.  Physical Exam ***  Recent Results (from the past 2160 hour(s))  Potassium     Status: None   Collection Time: 02/08/21 10:12 AM  Result Value Ref Range   Potassium 4.9 3.5 - 5.2 mmol/L  Lipid panel     Status: Abnormal   Collection Time: 03/28/21  1:50 PM  Result Value Ref Range   Cholesterol 218 (H) <200 mg/dL   HDL 64 > OR = 50 mg/dL   Triglycerides 169 (H) <150 mg/dL   LDL Cholesterol (Calc) 125 (H) mg/dL (calc)    Comment: Reference range: <100 . Desirable  range <100 mg/dL for primary prevention;   <70 mg/dL for patients with CHD or diabetic patients  with > or = 2 CHD risk factors. Marland Kitchen LDL-C is now calculated using the Callaway-Hopkins  calculation, which is a validated novel method providing  better accuracy than the Friedewald equation in the  estimation of LDL-C.  Cresenciano Genre et al. Annamaria Helling. 3557;322(02): 2061-2068  (http://education.[facility].com/faq/FAQ164)    Total CHOL/HDL Ratio 3.4 <5.0 (calc)   Non-HDL Cholesterol (Calc) 154 (H) <130 mg/dL (calc)    Comment: For patients with diabetes plus 1 major ASCVD risk  factor, treating to a non-HDL-C goal of <100 mg/dL  (LDL-C of <70 mg/dL) is considered a therapeutic  option.   COMPLETE METABOLIC PANEL WITH GFR     Status: None   Collection Time: 03/28/21  1:50 PM  Result Value Ref Range   Glucose, Bld 83 65 - 99 mg/dL    Comment: .            Fasting reference interval .    BUN 13 7 - 25 mg/dL   Creat 0.96 0.50 - 0.97 mg/dL   eGFR 80 > OR = 60 mL/min/1.12m    Comment: The eGFR is based on the CKD-EPI 2021 equation. To calculate  the new eGFR from a previous Creatinine or Cystatin C result, go to https://www.kidney.org/professionals/ kdoqi/gfr%5Fcalculator    BUN/Creatinine Ratio NOT APPLICABLE  6 - 22 (calc)   Sodium 137 135 - 146 mmol/L   Potassium 4.8 3.5 - 5.3 mmol/L   Chloride 102 98 - 110 mmol/L   CO2 27 20 - 32 mmol/L   Calcium 9.9 8.6 - 10.2 mg/dL   Total Protein 7.5 6.1 - 8.1 g/dL   Albumin 4.5 3.6 - 5.1 g/dL   Globulin 3.0 1.9 - 3.7 g/dL (calc)   AG Ratio 1.5 1.0 - 2.5 (calc)   Total Bilirubin 0.3 0.2 - 1.2 mg/dL   Alkaline phosphatase (APISO) 63 31 - 125 U/L   AST 14 10 - 30 U/L   ALT 11 6 - 29 U/L  CBC with Differential/Platelet     Status: Abnormal   Collection Time: 03/28/21  1:50 PM  Result Value Ref Range   WBC 8.7 3.8 - 10.8 Thousand/uL   RBC 4.35 3.80 - 5.10 Million/uL   Hemoglobin 12.5 11.7 - 15.5 g/dL   HCT 37.4 35.0 - 45.0 %   MCV 86.0 80.0 -  100.0 fL   MCH 28.7 27.0 - 33.0 pg   MCHC 33.4 32.0 - 36.0 g/dL   RDW 13.0 11.0 - 15.0 %   Platelets 414 (H) 140 - 400 Thousand/uL   MPV 9.1 7.5 - 12.5 fL   Neutro Abs 5,011 1,500 - 7,800 cells/uL   Lymphs Abs 2,758 850 - 3,900 cells/uL   Absolute Monocytes 757 200 - 950 cells/uL   Eosinophils Absolute 87 15 - 500 cells/uL   Basophils Absolute 87 0 - 200 cells/uL   Neutrophils Relative % 57.6 %   Total Lymphocyte 31.7 %   Monocytes Relative 8.7 %   Eosinophils Relative 1.0 %   Basophils Relative 1.0 %  TSH     Status: None   Collection Time: 03/28/21  1:50 PM  Result Value Ref Range   TSH 2.50 mIU/L    Comment:           Reference Range .           > or = 20 Years  0.40-4.50 .                Pregnancy Ranges           First trimester    0.26-2.66           Second trimester   0.55-2.73           Third trimester    0.43-2.91   Hemoglobin A1c     Status: None   Collection Time: 03/28/21  1:50 PM  Result Value Ref Range   Hgb A1c MFr Bld 5.4 <5.7 % of total Hgb    Comment: For the purpose of screening for the presence of diabetes: . <5.7%       Consistent with the absence of diabetes 5.7-6.4%    Consistent with increased risk for diabetes             (prediabetes) > or =6.5%  Consistent with diabetes . This assay result is consistent with a decreased risk of diabetes. . Currently, no consensus exists regarding use of hemoglobin A1c for diagnosis of diabetes in children. . According to American Diabetes Association (ADA) guidelines, hemoglobin A1c <7.0% represents optimal control in non-pregnant diabetic patients. Different metrics may apply to specific patient populations.  Standards of Medical Care in Diabetes(ADA). .    Mean Plasma Glucose 108 mg/dL   eAG (mmol/L) 6.0 mmol/L    Diabetic Foot Exam: Diabetic Foot Exam - Simple  No data filed    ***  Fall Risk: Fall Risk  03/28/2021 09/27/2020 05/31/2020 09/13/2019 07/05/2019  Falls in the past year? 0 0 0  0 0  Number falls in past yr: 0 0 0 0 0  Injury with Fall? 0 0 0 0 0  Follow up - - Falls evaluation completed Falls evaluation completed Falls evaluation completed   ***  Functional Status Survey:   ***  Assessment & Plan  1. Well adult exam ***   -USPSTF grade A and B recommendations reviewed with patient; age-appropriate recommendations, preventive care, screening tests, etc discussed and encouraged; healthy living encouraged; see AVS for patient education given to patient -Discussed importance of 150 minutes of physical activity weekly, eat two servings of fish weekly, eat one serving of tree nuts ( cashews, pistachios, pecans, almonds.Marland Kitchen) every other day, eat 6 servings of fruit/vegetables daily and drink plenty of water and avoid sweet beverages.   -Reviewed Health Maintenance: ***

## 2021-04-16 ENCOUNTER — Telehealth: Payer: Self-pay

## 2021-04-16 ENCOUNTER — Encounter: Payer: No Typology Code available for payment source | Admitting: Family Medicine

## 2021-04-16 DIAGNOSIS — Z0000 Encounter for general adult medical examination without abnormal findings: Secondary | ICD-10-CM

## 2021-04-16 DIAGNOSIS — Z124 Encounter for screening for malignant neoplasm of cervix: Secondary | ICD-10-CM

## 2021-04-16 NOTE — Telephone Encounter (Signed)
Cpe sch'd with Malachy Mood for 8.4.2022  Copied from [place] [id number]. Topic: General - Other >> Apr 16, 2021  1:56 PM Antonieta Iba C wrote: Reason for CRM: pt called in for assistance. Pt says that she had an apt with PCP today for her cpe. Pt says that she overslept due to working late and would like to know if possible could provider see her sooner that next available ? (November) pt says that she has to have it before Sept due to her cone insurance.   [id number] - please assist.

## 2021-04-19 ENCOUNTER — Ambulatory Visit (INDEPENDENT_AMBULATORY_CARE_PROVIDER_SITE_OTHER): Payer: No Typology Code available for payment source | Admitting: Unknown Physician Specialty

## 2021-04-19 ENCOUNTER — Encounter: Payer: Self-pay | Admitting: Unknown Physician Specialty

## 2021-04-19 ENCOUNTER — Other Ambulatory Visit: Payer: Self-pay

## 2021-04-19 VITALS — HR 98 | Temp 98.1°F | Resp 18 | Ht 69.0 in | Wt 151.9 lb

## 2021-04-19 DIAGNOSIS — Z0000 Encounter for general adult medical examination without abnormal findings: Secondary | ICD-10-CM | POA: Diagnosis not present

## 2021-04-19 NOTE — Progress Notes (Signed)
Pulse 98   Temp 98.1 F (36.7 C) (Oral)   Resp 18   Ht '5\' 9"'  (1.753 m)   Wt 151 lb 14.4 oz (68.9 kg)   LMP 03/30/2021   SpO2 96%   BMI 22.43 kg/m    Subjective:    Patient ID: Jean Foster, female    DOB: 1986-07-25, 35 y.o.   MRN: [id number]  HPI: Jean Foster is a 35 y.o. female  Pt is here for general history and physical  Depression screen [facility] 2/9 04/19/2021 03/28/2021 09/27/2020 05/31/2020 04/10/2020  Decreased Interest 0 0 0 0 0  Down, Depressed, Hopeless 0 0 0 0 0  PHQ - 2 Score 0 0 0 0 0  Altered sleeping 0 0 0 - 1  Tired, decreased energy 0 0 0 - 1  Change in appetite 0 0 0 - 0  Feeling bad or failure about yourself  0 0 0 - 0  Trouble concentrating 0 0 0 - 0  Moving slowly or fidgety/restless 0 0 0 - 0  Suicidal thoughts 0 0 0 - 0  PHQ-9 Score 0 0 0 - 2  Difficult doing work/chores Not difficult at all - Not difficult at all - Not difficult at all  Some recent data might be hidden     Chief Complaint  Patient presents with   Annual Exam   Social History   Socioeconomic History   Marital status: Divorced    Spouse name: Not on file   Number of children: 2   Years of education: 17   Highest education level: Bachelor's degree (e.g., BA, AB, BS)  Occupational History   Occupation:   Tobacco Use   Smoking status: Former    Packs/day: 0.25    Years: 12.00    Pack years: 3.00    Types: Cigarettes    Quit date: 06/16/2017    Years since quitting: 3.8   Smokeless tobacco: Never  Vaping Use   Vaping Use: Never used  Substance and Sexual Activity   Alcohol use: Yes    Alcohol/week: 2.0 - 3.0 standard drinks    Types: 2 - 3 Cans of beer per week    Comment: once a week   Drug use: No    Comment: last used 03/2017   Sexual activity: Yes    Partners: Male    Birth control/protection: Pill  Other Topics Concern   Not on file  Social History Narrative   Not on file   Social Determinants of Health   Financial Resource Strain: Low Risk     Difficulty of Paying Living Expenses: Not hard at all  Food Insecurity: No Food Insecurity   Worried About Charity fundraiser in the Last Year: Never true   [place] in the Last Year: Never true  Transportation Needs: No Transportation Needs   Lack of Transportation (Medical): No   Lack of Transportation (Non-Medical): No  Physical Activity: Insufficiently Active   Days of Exercise per Week: 3 days   Minutes of Exercise per Session: 40 min  Stress: No Stress Concern Present   Feeling of Stress : Only a little  Social Connections: Socially Isolated   Frequency of Communication with Friends and Family: More than three times a week   Frequency of Social Gatherings with Friends and Family: More than three times a week   Attends Religious Services: Never   Marine scientist or Organizations: No   Attends Archivist Meetings:  Never   Marital Status: Divorced  Human resources officer Violence: Not At Risk   Fear of Current or Ex-Partner: No   Emotionally Abused: No   Physically Abused: No   Sexually Abused: No   Family History  Problem Relation Age of Onset   Hypertension Mother    COPD Father    Depression Sister    Depression Sister    Bipolar disorder Maternal Grandmother    Bladder Cancer Maternal Grandfather    Lung cancer Paternal Grandmother    Alcoholism Paternal Grandfather    Cirrhosis Paternal Grandfather    Breast cancer Neg Hx    Ovarian cancer Neg Hx    Colon cancer Neg Hx    Diabetes Neg Hx    Past Medical History:  Diagnosis Date   Acne    Anxiety    Asthma    Depression    Depression, major, in remission ([place]) 07/05/2019   Thyroid disease    Past Surgical History:  Procedure Laterality Date   NO PAST SURGERIES     TOOTH EXTRACTION       Relevant past medical, surgical, family and social history reviewed and updated as indicated. Interim medical history since our last visit reviewed. Allergies and medications reviewed and  updated.  Review of Systems  Per HPI unless specifically indicated above     Objective:    Pulse 98   Temp 98.1 F (36.7 C) (Oral)   Resp 18   Ht '5\' 9"'  (1.753 m)   Wt 151 lb 14.4 oz (68.9 kg)   LMP 03/30/2021   SpO2 96%   BMI 22.43 kg/m   Wt Readings from Last 3 Encounters:  04/19/21 151 lb 14.4 oz (68.9 kg)  03/28/21 152 lb (68.9 kg)  09/27/20 163 lb 1.6 oz (74 kg)    Physical Exam Constitutional:      Appearance: She is well-developed.  HENT:     Head: Normocephalic and atraumatic.  Eyes:     General: No scleral icterus.       Right eye: No discharge.        Left eye: No discharge.     Pupils: Pupils are equal, round, and reactive to light.  Neck:     Thyroid: No thyromegaly.     Vascular: No carotid bruit.  Cardiovascular:     Rate and Rhythm: Normal rate and regular rhythm.     Heart sounds: Normal heart sounds. No murmur heard.   No friction rub. No gallop.  Pulmonary:     Effort: Pulmonary effort is normal. No respiratory distress.     Breath sounds: Normal breath sounds. No wheezing or rales.  Abdominal:     General: Bowel sounds are normal.     Palpations: Abdomen is soft.     Tenderness: There is no abdominal tenderness. There is no rebound.  Musculoskeletal:        General: Normal range of motion.     Cervical back: Normal range of motion and neck supple.  Lymphadenopathy:     Cervical: No cervical adenopathy.  Skin:    General: Skin is warm and dry.     Findings: No rash.  Neurological:     Mental Status: She is alert and oriented to person, place, and time.  Psychiatric:        Speech: Speech normal.        Behavior: Behavior normal.        Thought Content: Thought content normal.  Judgment: Judgment normal.    Results for orders placed or performed in visit on 03/28/21  Lipid panel  Result Value Ref Range   Cholesterol 218 (H) <200 mg/dL   HDL 64 > OR = 50 mg/dL   Triglycerides 169 (H) <150 mg/dL   LDL Cholesterol (Calc) 125  (H) mg/dL (calc)   Total CHOL/HDL Ratio 3.4 <5.0 (calc)   Non-HDL Cholesterol (Calc) 154 (H) <130 mg/dL (calc)  COMPLETE METABOLIC PANEL WITH GFR  Result Value Ref Range   Glucose, Bld 83 65 - 99 mg/dL   BUN 13 7 - 25 mg/dL   Creat 0.96 0.50 - 0.97 mg/dL   eGFR 80 > OR = 60 mL/min/1.40m   BUN/Creatinine Ratio NOT APPLICABLE 6 - 22 (calc)   Sodium 137 135 - 146 mmol/L   Potassium 4.8 3.5 - 5.3 mmol/L   Chloride 102 98 - 110 mmol/L   CO2 27 20 - 32 mmol/L   Calcium 9.9 8.6 - 10.2 mg/dL   Total Protein 7.5 6.1 - 8.1 g/dL   Albumin 4.5 3.6 - 5.1 g/dL   Globulin 3.0 1.9 - 3.7 g/dL (calc)   AG Ratio 1.5 1.0 - 2.5 (calc)   Total Bilirubin 0.3 0.2 - 1.2 mg/dL   Alkaline phosphatase (APISO) 63 31 - 125 U/L   AST 14 10 - 30 U/L   ALT 11 6 - 29 U/L  CBC with Differential/Platelet  Result Value Ref Range   WBC 8.7 3.8 - 10.8 Thousand/uL   RBC 4.35 3.80 - 5.10 Million/uL   Hemoglobin 12.5 11.7 - 15.5 g/dL   HCT 37.4 35.0 - 45.0 %   MCV 86.0 80.0 - 100.0 fL   MCH 28.7 27.0 - 33.0 pg   MCHC 33.4 32.0 - 36.0 g/dL   RDW 13.0 11.0 - 15.0 %   Platelets 414 (H) 140 - 400 Thousand/uL   MPV 9.1 7.5 - 12.5 fL   Neutro Abs 5,011 1,500 - 7,800 cells/uL   Lymphs Abs 2,758 850 - 3,900 cells/uL   Absolute Monocytes 757 200 - 950 cells/uL   Eosinophils Absolute 87 15 - 500 cells/uL   Basophils Absolute 87 0 - 200 cells/uL   Neutrophils Relative % 57.6 %   Total Lymphocyte 31.7 %   Monocytes Relative 8.7 %   Eosinophils Relative 1.0 %   Basophils Relative 1.0 %  TSH  Result Value Ref Range   TSH 2.50 mIU/L  Hemoglobin A1c  Result Value Ref Range   Hgb A1c MFr Bld 5.4 <5.7 % of total Hgb   Mean Plasma Glucose 108 mg/dL   eAG (mmol/L) 6.0 mmol/L       Assessment & Plan:   Problem List Items Addressed This Visit   None Visit Diagnoses     Annual physical exam    -  Primary   Labs reviewed and WNL.  Cholesterol education.  Needs platelets rechecked at a later date.        Pap due  2024 Not interested in STD checks today   Follow up plan: Return in about 1 year (around 04/19/2022).

## 2021-04-20 ENCOUNTER — Other Ambulatory Visit: Payer: Self-pay

## 2021-04-27 ENCOUNTER — Other Ambulatory Visit: Payer: Self-pay

## 2021-04-27 MED FILL — Norgestimate-Eth Estrad Tab 0.18-25/0.215-25/0.25-25 MG-MCG: ORAL | 84 days supply | Qty: 84 | Fill #1 | Status: AC

## 2021-05-10 ENCOUNTER — Other Ambulatory Visit: Payer: Self-pay

## 2021-05-10 ENCOUNTER — Other Ambulatory Visit: Payer: Self-pay | Admitting: Dermatology

## 2021-05-10 ENCOUNTER — Other Ambulatory Visit (HOSPITAL_COMMUNITY)
Admission: RE | Admit: 2021-05-10 | Discharge: 2021-05-10 | Disposition: A | Discharge status: Home / Self Care | Payer: No Typology Code available for payment source | Source: Ambulatory Visit | Attending: Obstetrics and Gynecology | Admitting: Obstetrics and Gynecology

## 2021-05-10 ENCOUNTER — Ambulatory Visit (INDEPENDENT_AMBULATORY_CARE_PROVIDER_SITE_OTHER): Payer: No Typology Code available for payment source | Admitting: Obstetrics and Gynecology

## 2021-05-10 ENCOUNTER — Encounter: Payer: Self-pay | Admitting: Obstetrics and Gynecology

## 2021-05-10 VITALS — BP 116/75 | HR 90 | Ht 69.0 in | Wt 156.0 lb

## 2021-05-10 DIAGNOSIS — Z01419 Encounter for gynecological examination (general) (routine) without abnormal findings: Secondary | ICD-10-CM | POA: Diagnosis not present

## 2021-05-10 DIAGNOSIS — Z3041 Encounter for surveillance of contraceptive pills: Secondary | ICD-10-CM | POA: Diagnosis not present

## 2021-05-10 DIAGNOSIS — F410 Panic disorder [episodic paroxysmal anxiety] without agoraphobia: Secondary | ICD-10-CM

## 2021-05-10 DIAGNOSIS — Z124 Encounter for screening for malignant neoplasm of cervix: Secondary | ICD-10-CM

## 2021-05-10 DIAGNOSIS — L700 Acne vulgaris: Secondary | ICD-10-CM

## 2021-05-10 DIAGNOSIS — E039 Hypothyroidism, unspecified: Secondary | ICD-10-CM

## 2021-05-10 MED ORDER — NORGESTIM-ETH ESTRAD TRIPHASIC 0.18/0.215/0.25 MG-25 MCG PO TABS
1.0000 | ORAL_TABLET | Freq: Every day | ORAL | 3 refills | Status: DC
  Filled 2021-05-10 – 2021-07-23 (×2): qty 84, 84d supply, fill #0
  Filled 2021-10-18: qty 84, 84d supply, fill #1

## 2021-05-10 MED ORDER — SPIRONOLACTONE 50 MG PO TABS
ORAL_TABLET | ORAL | 0 refills | Status: DC
  Filled 2021-05-10: qty 90, 30d supply, fill #0

## 2021-05-10 NOTE — Progress Notes (Signed)
GYNECOLOGY ANNUAL PHYSICAL EXAM PROGRESS NOTE  Subjective:    Jean Foster is a 35 y.o. G14P2002 female who presents for an annual exam. The patient has no complaints today. The patient is sexually active.  The patient wears seatbelts: yes. The patient participates in regular exercise: no. Has the patient ever been transfused or tattooed?: no. The patient reports that there is not domestic violence in her life.   Patient is doing well since starting OCPs, having regular menstrual cycles monthly. She states her periods are less heavy than before and is very pleased with the change. LMP 04/24/2020. She states her anxiety and panic attacks have been under control with medications this past year and her mental health is stable.   Menstrual History: Menarche age: 25 Patient's last menstrual period was 04/23/2021 (exact date). Period Duration (Days): 4 Period Pattern: Regular Menstrual Flow: Light, Moderate Menstrual Control: Tampon Menstrual Control Change Freq (Hours): 6-8 Dysmenorrhea: (!) Mild Dysmenorrhea Symptoms: Cramping   Gynecologic History Menarche age: 58 Contraception: combined OCPs - Ortho Tri-cyclen Lo History of STI's: Denies Last Pap: 09/14/2018. Results were: normal.  Denies h/o abnormal pap smears.    Upstream - 05/10/21 1407       Pregnancy Intention Screening   Does the patient want to become pregnant in the next year? No    Does the patient's partner want to become pregnant in the next year? No    Would the patient like to discuss contraceptive options today? No      Contraception Wrap Up   Current Method Oral Contraceptive              The pregnancy intention screening data noted above was reviewed. Potential methods of contraception were discussed. The patient elected to continue with Oral Contraceptive.    OB History  Gravida Para Term Preterm AB Living  '2 2 2 '$ 0 0 2  SAB IAB Ectopic Multiple Live Births  0 0 0 0 2    # Outcome Date GA Lbr  Len/2nd Weight Sex Delivery Anes PTL Lv  2 Term 06/29/18 [redacted]w[redacted]d 6 lb 2.4 oz (2.79 kg) M Vag-Spont None  LIV     Name: Esker,BOY Tilda     Apgar1: 8  Apgar5: 9  1 Term 2013   8 lb 8 oz (3.856 kg) M Vag-Spont   LIV    Past Medical History:  Diagnosis Date   Acne    Anxiety    Asthma    Depression    Depression, major, in remission ([place] 07/05/2019   Thyroid disease     Past Surgical History:  Procedure Laterality Date   NO PAST SURGERIES     TOOTH EXTRACTION      Family History  Problem Relation Age of Onset   Hypertension Mother    COPD Father    Depression Sister    Depression Sister    Bipolar disorder Maternal Grandmother    Bladder Cancer Maternal Grandfather    Lung cancer Paternal Grandmother    Alcoholism Paternal Grandfather    Cirrhosis Paternal Grandfather    Breast cancer Neg Hx    Ovarian cancer Neg Hx    Colon cancer Neg Hx    Diabetes Neg Hx     Social History   Socioeconomic History   Marital status: Divorced    Spouse name: Not on file   Number of children: 2   Years of education: 17   Highest education level: Bachelor's degree (e.g., BA, AB,  BS)  Occupational History   Occupation: [employer]  Tobacco Use   Smoking status: Former    Packs/day: 0.25    Years: 12.00    Pack years: 3.00    Types: Cigarettes    Quit date: 06/16/2017    Years since quitting: 3.9   Smokeless tobacco: Never  Vaping Use   Vaping Use: Never used  Substance and Sexual Activity   Alcohol use: Yes    Alcohol/week: 2.0 - 3.0 standard drinks    Types: 2 - 3 Cans of beer per week    Comment: once a week   Drug use: No    Comment: last used 03/2017   Sexual activity: Yes    Partners: Male    Birth control/protection: Pill  Other Topics Concern   Not on file  Social History Narrative   Not on file   Social Determinants of Health   Financial Resource Strain: Low Risk    Difficulty of Paying Living Expenses: Not hard at all  Food Insecurity: No Food  Insecurity   Worried About Charity fundraiser in the Last Year: Never true   [place]ane in the Last Year: Never true  Transportation Needs: No Transportation Needs   Lack of Transportation (Medical): No   Lack of Transportation (Non-Medical): No  Physical Activity: Insufficiently Active   Days of Exercise per Week: 3 days   Minutes of Exercise per Session: 40 min  Stress: No Stress Concern Present   Feeling of Stress : Only a little  Social Connections: Socially Isolated   Frequency of Communication with Friends and Family: More than three times a week   Frequency of Social Gatherings with Friends and Family: More than three times a week   Attends Religious Services: Never   Marine scientist or Organizations: No   Attends Music therapist: Never   Marital Status: Divorced  Human resources officer Violence: Not At Risk   Fear of Current or Ex-Partner: No   Emotionally Abused: No   Physically Abused: No   Sexually Abused: No    Current Outpatient Medications on File Prior to Visit  Medication Sig Dispense Refill   Adapalene (DIFFERIN) 0.3 % gel Apply a pea sized amount to the entire face QHS. 45 g 6   albuterol (PROVENTIL HFA;VENTOLIN HFA) 108 (90 Base) MCG/ACT inhaler Inhale into the lungs every 6 (six) hours as needed for wheezing or shortness of breath.     ARIPiprazole (ABILIFY) 2 MG tablet TAKE 1 TABLET BY MOUTH DAILY. 90 tablet 1   buPROPion (WELLBUTRIN XL) 150 MG 24 hr tablet TAKE 1 TABLET BY MOUTH DAILY. 90 tablet 1   busPIRone (BUSPAR) 10 MG tablet TAKE 1 TABLET BY MOUTH 2 TIMES DAILY AS NEEDED. 100 tablet 1   Dapsone 7.5 % GEL APPLY TO THE AFFECTED AREA(S) IN THE MORNING 60 g 5   doxycycline (PERIOSTAT) 20 MG tablet Take 20 mg by mouth 2 (two) times daily.     hydrOXYzine (ATARAX/VISTARIL) 25 MG tablet Take 1 tablet (25 mg total) by mouth 3 (three) times daily as needed for anxiety. 100 tablet 1   levothyroxine (SYNTHROID) 25 MCG tablet TAKE 1 TABLET BY  MOUTH DAILY BEFORE BREAKFAST 90 tablet 0   Multiple Vitamin (MULTIVITAMIN) tablet Take 1 tablet by mouth daily.     Norgestimate-Ethinyl Estradiol Triphasic 0.18/0.215/0.25 MG-25 MCG tab TAKE 1 TABLET BY MOUTH DAILY. 84 tablet 2   vitamin B-12 (CYANOCOBALAMIN) 500 MCG tablet Take  500 mcg by mouth daily.     vitamin C (ASCORBIC ACID) 500 MG tablet Take 500 mg by mouth daily.     No current facility-administered medications on file prior to visit.    No Known Allergies    Review of Systems Constitutional: negative for chills, fatigue, fevers and sweats Eyes: negative for irritation, redness and visual disturbance Ears, nose, mouth, throat, and face: negative for hearing loss, nasal congestion, snoring and tinnitus Respiratory: negative for asthma, cough, sputum Cardiovascular: negative for chest pain, dyspnea, exertional chest pressure/discomfort, irregular heart beat, palpitations and syncope Gastrointestinal: negative for abdominal pain, change in bowel habits, nausea and vomiting Genitourinary:  Negative for genital lesions, sexual problems and vaginal discharge, dysuria and urinary incontinence Integument/breast: negative for breast lump, breast tenderness and nipple discharge Hematologic/lymphatic: negative for bleeding and easy bruising Musculoskeletal:negative for back pain and muscle weakness Neurological: negative for dizziness, headaches, vertigo and weakness Endocrine: negative for diabetic symptoms including polydipsia, polyuria and skin dryness Allergic/Immunologic: negative for hay fever and urticaria    Psychologic: + for h/o anxiety, panic attacks, occasional mood swings. Stable with current medications. Negative for depression, hallucinations.      Objective:    General Appearance:    Alert, cooperative, no distress, appears stated age  Head:    Normocephalic, without obvious abnormality, atraumatic  Eyes:    PERRL, conjunctiva/corneas clear, EOM's intact, both eyes   Ears:    Normal external ear canals, both ears  Nose:   Nares normal, septum midline, mucosa normal, no drainage or sinus tenderness  Throat:   Lips, mucosa, and tongue normal; teeth and gums normal  Neck:   Supple, symmetrical, trachea midline, no adenopathy; thyroid: no enlargement/tenderness/nodules; no carotid bruit or JVD  Back:     Symmetric, no curvature, ROM normal, no CVA tenderness  Lungs:     Clear to auscultation bilaterally, respirations unlabored  Chest Wall:    No tenderness or deformity   Heart:    Regular rate and rhythm, S1 and S2 normal, no murmur, rub or gallop  Breast Exam:    No tenderness, masses, or nipple abnormality  Abdomen:     Soft, non-tender, bowel sounds active all four quadrants, no masses, no organomegaly.    Genitalia:    Pelvic:external genitalia normal, vagina without lesions, discharge, or tenderness, rectovaginal septum  normal. Cervix normal in appearance, no cervical motion tenderness, no adnexal masses or tenderness. Uterus normal size, shape, mobile, regular contours, nontender.  Rectal:    Normal external sphincter.  No hemorrhoids appreciated. Internal exam not done.   Extremities:   Extremities normal, atraumatic, no cyanosis or edema  Pulses:   2+ and symmetric all extremities  Skin:   Skin color, texture, turgor normal, no rashes or lesions  Lymph nodes:   Cervical, supraclavicular, and axillary nodes normal  Neurologic:   CNII-XII intact, normal strength, sensation and reflexes throughout     GAD 7 : Generalized Anxiety Score 04/19/2021 04/10/2020 07/05/2019 04/13/2019  Nervous, Anxious, on Edge 0 0 0 2  Control/stop worrying 0 0 2 2  Worry too much - different things 0 '1 3 3  '$ Trouble relaxing 0 0 0 3  Restless 0 0 0 0  Easily annoyed or irritable 0 0 0 0  Afraid - awful might happen 0 0 0 3  Total GAD 7 Score 0 '1 5 13  '$ Anxiety Difficulty Not difficult at all - Somewhat difficult Very difficult     Labs:  Lab Results  Component Value  Date   WBC 8.7 03/28/2021   HGB 12.5 03/28/2021   HCT 37.4 03/28/2021   MCV 86.0 03/28/2021   PLT 414 (H) 03/28/2021    Lab Results  Component Value Date   CREATININE 0.96 03/28/2021   BUN 13 03/28/2021   NA 137 03/28/2021   K 4.8 03/28/2021   CL 102 03/28/2021   CO2 27 03/28/2021    Lab Results  Component Value Date   ALT 11 03/28/2021   AST 14 03/28/2021   BILITOT 0.3 03/28/2021    Lab Results  Component Value Date   TSH 2.50 03/28/2021     Assessment:   Encounter for well woman exam with routine gynecological exam  Acquired hypothyroidism  Panic anxiety syndrome  Contraception surveillance   Plan:     Blood tests: completed PCP 03/28/2021.  Breast self exam technique reviewed and patient encouraged to perform self-exam monthly. Contraception: Continue Ortho Tri-cyclen Lo. Will renew prescription in 2 months. Discussed healthy lifestyle modifications. Will continue to eat a healthy diet and exercise.  Pap smear ordered.  Hypothyroidism and panic disorder managed by PCP. Notes stability on treatment. Has completed Gardasil vaccine series.  Cannot recall exact dates.  Has her Covid vaccination complete: 2 shot series (March 15 and April 9).  Panic anxiety syndrome stable on medications. Managed by PCP.  RTC in 1 year for annual exam.    Rubie Maid  MD [facility]

## 2021-05-16 LAB — CYTOLOGY - PAP
Comment: NEGATIVE
Diagnosis: NEGATIVE
High risk HPV: NEGATIVE

## 2021-05-18 ENCOUNTER — Other Ambulatory Visit: Payer: Self-pay

## 2021-05-18 ENCOUNTER — Other Ambulatory Visit: Payer: Self-pay | Admitting: Family Medicine

## 2021-05-18 DIAGNOSIS — E039 Hypothyroidism, unspecified: Secondary | ICD-10-CM

## 2021-05-18 MED FILL — Levothyroxine Sodium Tab 25 MCG: ORAL | 90 days supply | Qty: 90 | Fill #0 | Status: AC

## 2021-05-22 NOTE — Telephone Encounter (Signed)
Appt sch'd for march

## 2021-06-18 ENCOUNTER — Other Ambulatory Visit: Payer: Self-pay | Admitting: Dermatology

## 2021-06-18 ENCOUNTER — Other Ambulatory Visit: Payer: Self-pay

## 2021-06-18 DIAGNOSIS — L700 Acne vulgaris: Secondary | ICD-10-CM

## 2021-06-18 MED ORDER — SPIRONOLACTONE 50 MG PO TABS
ORAL_TABLET | ORAL | 1 refills | Status: DC
  Filled 2021-06-18: qty 90, 30d supply, fill #0
  Filled 2021-07-23: qty 90, 30d supply, fill #1

## 2021-07-08 ENCOUNTER — Other Ambulatory Visit: Payer: Self-pay

## 2021-07-08 ENCOUNTER — Encounter: Payer: Self-pay | Admitting: Emergency Medicine

## 2021-07-08 ENCOUNTER — Ambulatory Visit (INDEPENDENT_AMBULATORY_CARE_PROVIDER_SITE_OTHER): Payer: No Typology Code available for payment source

## 2021-07-08 ENCOUNTER — Ambulatory Visit
Admission: EM | Admit: 2021-07-08 | Discharge: 2021-07-08 | Disposition: A | Discharge status: Home / Self Care | Payer: No Typology Code available for payment source | Attending: Internal Medicine | Admitting: Internal Medicine

## 2021-07-08 DIAGNOSIS — R079 Chest pain, unspecified: Secondary | ICD-10-CM | POA: Diagnosis not present

## 2021-07-08 DIAGNOSIS — M7918 Myalgia, other site: Secondary | ICD-10-CM | POA: Diagnosis not present

## 2021-07-08 MED ORDER — IBUPROFEN 600 MG PO TABS: 600.0000 mg | ORAL_TABLET | Freq: Four times a day (QID) | ORAL | 0 refills | Status: DC | PRN

## 2021-07-08 NOTE — ED Triage Notes (Signed)
Patient c/o left sided rib pain that started 2 weeks ago.  Patient denies any cold symptoms.  Patient reports pain worse when taking a deep breath or if she coughs.

## 2021-07-08 NOTE — ED Provider Notes (Signed)
[facility]    CSN: [id number] Arrival date & time: 07/08/21  4097      History   Chief Complaint Chief Complaint  Patient presents with   Rib Pain    Left sided    HPI Jean Foster is a 35 y.o. female comes to the urgent care with 2-week history of left-sided chest pain.  Pain started insidiously and has been persistent.  Pain is sharp in nature and currently of moderate severity.  Pain is aggravated by taking a deep breath or coughing.  No shortness of breath or wheezing.  No trauma to the left side of the chest.  Pain does not radiate.  No fever or chills.  No heavy lifting.  No recent travel or long distance drives.  No calf pain/swelling.   HPI  Past Medical History:  Diagnosis Date   Acne    Anxiety    Asthma    Depression    Depression, major, in remission ([place]) 07/05/2019   Thyroid disease     Patient Active Problem List   Diagnosis Date Noted   Bipolar disorder with depression ([place]) 04/10/2020   Acquired hypothyroidism 04/30/2019   Panic attack 04/13/2019   Cystic acne 01/01/2019   Human papilloma virus infection 10/02/2015    Past Surgical History:  Procedure Laterality Date   NO PAST SURGERIES     TOOTH EXTRACTION      OB History     Gravida  2   Para  2   Term  2   Preterm      AB      Living  2      SAB      IAB      Ectopic      Multiple  0   Live Births  2            Home Medications    Prior to Admission medications   Medication Sig Start Date End Date Taking? Authorizing Provider  Adapalene (DIFFERIN) 0.3 % gel Apply a pea sized amount to the entire face QHS. 02/20/21  Yes Moye, [place], MD  ARIPiprazole (ABILIFY) 2 MG tablet TAKE 1 TABLET BY MOUTH DAILY. 03/28/21 03/28/22 Yes Sowles, Drue Stager, MD  buPROPion (WELLBUTRIN XL) 150 MG 24 hr tablet TAKE 1 TABLET BY MOUTH DAILY. 03/28/21 03/28/22 Yes Sowles, Drue Stager, MD  busPIRone (BUSPAR) 10 MG tablet TAKE 1 TABLET BY MOUTH 2 TIMES DAILY AS NEEDED. 03/28/21  03/28/22 Yes Sowles, Drue Stager, MD  Dapsone 7.5 % GEL APPLY TO THE AFFECTED AREA(S) IN THE MORNING 08/17/20 08/17/21 Yes Moye, [place], MD  hydrOXYzine (ATARAX/VISTARIL) 25 MG tablet Take 1 tablet (25 mg total) by mouth 3 (three) times daily as needed for anxiety. 03/28/21 03/28/22 Yes Sowles, Drue Stager, MD  ibuprofen (ADVIL) 600 MG tablet Take 1 tablet (600 mg total) by mouth every 6 (six) hours as needed. 07/08/21  Yes Kathia Covington, Myrene Galas, MD  levothyroxine (SYNTHROID) 25 MCG tablet TAKE 1 TABLET BY MOUTH DAILY BEFORE BREAKFAST 05/18/21 05/18/22 Yes Sowles, Drue Stager, MD  Multiple Vitamin (MULTIVITAMIN) tablet Take 1 tablet by mouth daily.   Yes [provider]  Norgestimate-Ethinyl Estradiol Triphasic 0.18/0.215/0.25 MG-25 MCG tab TAKE 1 TABLET BY MOUTH DAILY. 05/10/21 05/10/22 Yes Rubie Maid, MD  spironolactone (ALDACTONE) 50 MG tablet Take 2 tablets by mouth every morning and 1 tablet at night as directed 06/18/21  Yes Moye, [place], MD  vitamin B-12 (CYANOCOBALAMIN) 500 MCG tablet Take 500 mcg by mouth daily.   Yes [provider]  vitamin C (ASCORBIC ACID) 500 MG tablet Take 500 mg by mouth daily.   Yes [provider]  albuterol (PROVENTIL HFA;VENTOLIN HFA) 108 (90 Base) MCG/ACT inhaler Inhale into the lungs every 6 (six) hours as needed for wheezing or shortness of breath.    [provider]    Family History Family History  Problem Relation Age of Onset   Hypertension Mother    COPD Father    Depression Sister    Depression Sister    Bipolar disorder Maternal Grandmother    Bladder Cancer Maternal Grandfather    Lung cancer Paternal Grandmother    Alcoholism Paternal Grandfather    Cirrhosis Paternal Grandfather    Breast cancer Neg Hx    Ovarian cancer Neg Hx    Colon cancer Neg Hx    Diabetes Neg Hx     Social History Social History   Tobacco Use   Smoking status: Former    Packs/day: 0.25    Years: 12.00    Pack years: 3.00    Types: Cigarettes     Quit date: 06/16/2017    Years since quitting: 4.0   Smokeless tobacco: Never  Vaping Use   Vaping Use: Never used  Substance Use Topics   Alcohol use: Yes    Alcohol/week: 2.0 - 3.0 standard drinks    Types: 2 - 3 Cans of beer per week    Comment: once a week   Drug use: No    Comment: last used 03/2017     Allergies   Patient has no known allergies.   Review of Systems Review of Systems  Constitutional: Negative.   HENT:  Negative for congestion, mouth sores, postnasal drip and rhinorrhea.   Respiratory:  Negative for choking, wheezing and stridor.   Cardiovascular:  Positive for chest pain.  Genitourinary: Negative.     Physical Exam Triage Vital Signs ED Triage Vitals  Enc Vitals Group     BP 07/08/21 0905 135/84     Pulse Rate 07/08/21 0905 75     Resp 07/08/21 0905 14     Temp 07/08/21 0905 98.3 F (36.8 C)     Temp Source 07/08/21 0905 Oral     SpO2 07/08/21 0905 97 %     Weight 07/08/21 0902 150 lb (68 kg)     Height 07/08/21 0902 5\' 9"  (1.753 m)     Head Circumference --      Peak Flow --      Pain Score 07/08/21 0901 6     Pain Loc --      Pain Edu? --      Excl. in [place]? --    No data found.  Updated Vital Signs BP 135/84 (BP Location: Right Arm)   Pulse 75   Temp 98.3 F (36.8 C) (Oral)   Resp 14   Ht 5\' 9"  (1.753 m)   Wt 68 kg   LMP 06/24/2021 (Approximate)   SpO2 97%   Breastfeeding No   BMI 22.15 kg/m   Visual Acuity Right Eye Distance:   Left Eye Distance:   Bilateral Distance:    Right Eye Near:   Left Eye Near:    Bilateral Near:     Physical Exam Vitals and nursing note reviewed.  Constitutional:      General: She is not in acute distress.    Appearance: She is not ill-appearing.  HENT:     Right Ear: Tympanic membrane normal.  Left Ear: Tympanic membrane normal.  Cardiovascular:     Rate and Rhythm: Normal rate and regular rhythm.  Pulmonary:     Effort: Pulmonary effort is normal.     Breath sounds: Normal  breath sounds. No rhonchi or rales.     Comments: Tenderness on palpation of the intercostal space between the fourth and fifth rib on the left side. Chest:     Chest wall: No tenderness.  Abdominal:     General: Abdomen is flat.     Palpations: Abdomen is soft.  Neurological:     Mental Status: She is alert.     UC Treatments / Results  Labs (all labs ordered are listed, but only abnormal results are displayed) Labs Reviewed - No data to display  EKG   Radiology DG Chest 2 View  Result Date: 07/08/2021 CLINICAL DATA:  Left-sided chest pain EXAM: CHEST - 2 VIEW COMPARISON:  06/23/2017 FINDINGS: The heart size and mediastinal contours are within normal limits. Both lungs are clear. The visualized skeletal structures are unremarkable. IMPRESSION: No active cardiopulmonary disease. Electronically Signed   By: Kerby Moors M.D.   On: 07/08/2021 09:58    Procedures Procedures (including critical care time)  Medications Ordered in UC Medications - No data to display  Initial Impression / Assessment and Plan / UC Course  I have reviewed the triage vital signs and the nursing notes.  Pertinent labs & imaging results that were available during my care of the patient were reviewed by me and considered in my medical decision making (see chart for details).     1.  Intercostal muscle pain: Chest x-ray is negative for lung infiltrate or fracture Ibuprofen as needed for pain Heating pad use helps with the symptoms Return to urgent care if symptoms worsen. Final Clinical Impressions(s) / UC Diagnoses   Final diagnoses:  Intercostal muscle pain     Discharge Instructions      Gentle range of motion exercises Take ibuprofen as needed for pain Gentle stretching Heating pad 20 minutes on-20 minutes off cycle helps with pain If you have worsening symptoms please return to urgent care to be reevaluated.   ED Prescriptions     Medication Sig Dispense Auth. Provider    ibuprofen (ADVIL) 600 MG tablet Take 1 tablet (600 mg total) by mouth every 6 (six) hours as needed. 30 tablet Orlando Devereux, Myrene Galas, MD      PDMP not reviewed this encounter.   Chase Picket, MD 07/08/21 1013

## 2021-07-08 NOTE — Discharge Instructions (Signed)
Gentle range of motion exercises Take ibuprofen as needed for pain Gentle stretching Heating pad 20 minutes on-20 minutes off cycle helps with pain If you have worsening symptoms please return to urgent care to be reevaluated.

## 2021-07-18 ENCOUNTER — Encounter: Payer: Self-pay | Admitting: Family Medicine

## 2021-07-23 ENCOUNTER — Other Ambulatory Visit: Payer: Self-pay

## 2021-08-07 ENCOUNTER — Encounter: Payer: Self-pay | Admitting: Family Medicine

## 2021-08-10 ENCOUNTER — Other Ambulatory Visit (HOSPITAL_COMMUNITY): Payer: Self-pay

## 2021-09-04 ENCOUNTER — Other Ambulatory Visit: Payer: Self-pay | Admitting: Dermatology

## 2021-09-04 ENCOUNTER — Other Ambulatory Visit: Payer: Self-pay

## 2021-09-04 DIAGNOSIS — L700 Acne vulgaris: Secondary | ICD-10-CM

## 2021-09-04 MED ORDER — SPIRONOLACTONE 50 MG PO TABS
ORAL_TABLET | ORAL | 1 refills | Status: DC
  Filled 2021-09-04: qty 90, 30d supply, fill #0
  Filled 2021-10-16: qty 90, 30d supply, fill #1

## 2021-09-04 MED FILL — Levothyroxine Sodium Tab 25 MCG: ORAL | 90 days supply | Qty: 90 | Fill #1 | Status: AC

## 2021-09-21 ENCOUNTER — Ambulatory Visit: Payer: Self-pay | Admitting: *Deleted

## 2021-09-21 NOTE — Telephone Encounter (Signed)
°  Chief Complaint: "Hole in nasal septum" Symptoms: "Always had nasal issues" HAd sore "Messed with" now a hole 1/2 eraser tip. Frequency: Noted this AM Pertinent Negatives: Patient denies bleeding, any drainage,pain Disposition: [] ED /[] Urgent Care (no appt availability in office) / [] Appointment(In office/virtual)/ []  [facility]/ [] Home Care/ [] Refused Recommended Disposition /[] [facility]/ []  Follow-up with PCP Additional Notes: Attempted to reach practice for consult, rapid busy signal x 2 attempts. Requesting ENT referral. Please advise on disposition.       Answer Assessment - Initial Assessment Questions 1. MECHANISM: "How did the injury happen?"      Using too much Afrin" 2. ONSET: "When did the injury happen?" (Minutes or hours ago)      Noted "Hole" this AM 3. LOCATION: "What part of the nose is injured?"      Septum 4. APPEARANCE of INJURY: "What does the nose look like?"      Hole size of 1/2 eraser tip 5. BLEEDING: "Is the nose still bleeding?" If Yes, ask: "Is it difficult to stop?"      no 6. SIZE: For cuts, bruises, or swelling, ask: "How large is it?" (e.g., inches or centimeters;  entire nose)      NA 7. PAIN: "Is it painful?" If Yes, ask: "How bad is the pain?"   (Scale 1-10; or mild, moderate, severe)     no 8. TETANUS: For any breaks in the skin, ask: "When was the last tetanus booster?"      9. OTHER SYMPTOMS: "Do you have any other symptoms?" (e.g., headache, neck pain, loss of consciousness)     None  Protocols used: Nose Injury-A-AH

## 2021-09-21 NOTE — Telephone Encounter (Signed)
Confirmed no appts today, pt schedule for 1/10

## 2021-09-25 ENCOUNTER — Encounter: Payer: Self-pay | Admitting: Unknown Physician Specialty

## 2021-09-25 ENCOUNTER — Ambulatory Visit (INDEPENDENT_AMBULATORY_CARE_PROVIDER_SITE_OTHER): Payer: No Typology Code available for payment source | Admitting: Unknown Physician Specialty

## 2021-09-25 VITALS — BP 118/70 | HR 92 | Temp 98.1°F | Resp 16 | Ht 69.0 in | Wt 155.9 lb

## 2021-09-25 DIAGNOSIS — J340 Abscess, furuncle and carbuncle of nose: Secondary | ICD-10-CM | POA: Diagnosis not present

## 2021-09-25 NOTE — Progress Notes (Signed)
BP 118/70    Pulse 92    Temp 98.1 F (36.7 C) (Oral)    Resp 16    Ht 5\' 9"  (1.753 m)    Wt 155 lb 14.4 oz (70.7 kg)    SpO2 99%    BMI 23.02 kg/m    Subjective:    Patient ID: Jean Foster, female    DOB: 03/31/1986, 36 y.o.   MRN: [id number]  HPI: Jean Foster is a 36 y.o. female  Chief Complaint  Patient presents with   Nasal Congestion    Continues having slight congestion   Facial Injury    Pt states has a hole in her nose due to using so much Afrin. Pt states she used it for over a year and believes it cause a hole through her internal nose.   Nasal congestion: Was using Afrin daily, stopped cold [place] 1-2 months after getting a sore in her nose.  Started picking at the sore and has a hole in her septum.  Needs a referral for an ENT.    Relevant past medical, surgical, family and social history reviewed and updated as indicated. Interim medical history since our last visit reviewed. Allergies and medications reviewed and updated.  Review of Systems  Per HPI unless specifically indicated above     Objective:    BP 118/70    Pulse 92    Temp 98.1 F (36.7 C) (Oral)    Resp 16    Ht 5\' 9"  (1.753 m)    Wt 155 lb 14.4 oz (70.7 kg)    SpO2 99%    BMI 23.02 kg/m   Wt Readings from Last 3 Encounters:  09/25/21 155 lb 14.4 oz (70.7 kg)  07/08/21 150 lb (68 kg)  05/10/21 156 lb (70.8 kg)    Physical Exam Constitutional:      General: She is not in acute distress.    Appearance: Normal appearance. She is well-developed.  HENT:     Head: Normocephalic and atraumatic.     Nose: Congestion present. No nasal tenderness.     Comments: Note ulcers but not a hole in septum.  Eyes:     General: Lids are normal. No scleral icterus.       Right eye: No discharge.        Left eye: No discharge.     Conjunctiva/sclera: Conjunctivae normal.  Neck:     Vascular: No carotid bruit or JVD.  Cardiovascular:     Heart sounds: Normal heart sounds.  Pulmonary:     Effort:  Pulmonary effort is normal. No respiratory distress.  Abdominal:     Palpations: There is no hepatomegaly or splenomegaly.  Musculoskeletal:        General: Normal range of motion.     Cervical back: Normal range of motion and neck supple.  Skin:    General: Skin is warm and dry.     Coloration: Skin is not pale.     Findings: No rash.  Neurological:     Mental Status: She is alert and oriented to person, place, and time.  Psychiatric:        Behavior: Behavior normal.        Thought Content: Thought content normal.        Judgment: Judgment normal.    Results for orders placed or performed in visit on 05/10/21  Cytology - PAP  Result Value Ref Range   High risk HPV Negative    Adequacy  Satisfactory for evaluation; transformation zone component PRESENT.   Diagnosis      - Negative for intraepithelial lesion or malignancy (NILM)   Comment Normal Reference Range HPV - Negative       Assessment & Plan:   Problem List Items Addressed This Visit   None Visit Diagnoses     Ulcer of nose (septum)    -  Primary   OK to use saline spray with gentle blowing of nose.  OTC non-sedating antihistamine.  Refer to ENT   Relevant Orders   Ambulatory referral to ENT        Follow up plan: Return if symptoms worsen or fail to improve.

## 2021-10-04 ENCOUNTER — Other Ambulatory Visit: Payer: Self-pay

## 2021-10-04 MED ORDER — GENTAMICIN SULFATE 0.1 % EX OINT
TOPICAL_OINTMENT | CUTANEOUS | 12 refills | Status: DC
  Filled 2021-10-04: qty 30, 10d supply, fill #0
  Filled 2022-09-26: qty 30, 30d supply, fill #1

## 2021-10-05 ENCOUNTER — Other Ambulatory Visit: Payer: Self-pay

## 2021-10-16 ENCOUNTER — Other Ambulatory Visit: Payer: Self-pay | Admitting: Family Medicine

## 2021-10-16 ENCOUNTER — Other Ambulatory Visit: Payer: Self-pay

## 2021-10-16 DIAGNOSIS — F319 Bipolar disorder, unspecified: Secondary | ICD-10-CM

## 2021-10-16 MED ORDER — ARIPIPRAZOLE 2 MG PO TABS
ORAL_TABLET | Freq: Every day | ORAL | 0 refills | Status: DC
  Filled 2021-10-16: qty 30, 30d supply, fill #0

## 2021-10-17 ENCOUNTER — Other Ambulatory Visit: Payer: Self-pay

## 2021-10-18 ENCOUNTER — Other Ambulatory Visit: Payer: Self-pay

## 2021-11-14 NOTE — Progress Notes (Signed)
Name: Jean Foster   MRN: [id number]    DOB: 09-Apr-1986   Date:11/15/2021       Progress Note  Subjective  Chief Complaint  Medication refill  HPI  Hypothyroid -  taking 61mcg daily  , no change in bowel movements, dry skin, dysphagia  or palpitation.  TSH has been stable for one year    Bipolar Disorder  and GAD with panic attacks: she states she has a long history of depression diagnosed with bipolar about 10 years ago ( while in [place]). She states manic episodes included being promiscuous, doing drugs - smoking weed and taking random pills.  She states symptoms were severe and had to take medications in College - she states initially diagnosed with bipolar depression. She had highs and lows, she had suicidal thoughts , she saw a psychiatrist in the past , tried to come off medication but did not do well.She states she tried lexapro but it caused sexual dysfunction . She is taking Buspar once a day and Hydroxyzine prn panic attacks for prn sleep  She is now on Wellbutrin, Abilify for mood management and doing well. phq 9 is normal and she feels well. 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 She works three shifts per week   Acne: doing well on topical medication and spironolactone, she is very pleased with results Currently no problems    Dyslipidemia: discussed cutting down on fast food, she eats protein shake for breakfast, eats out a few times a week/fast food.   Asthma: used to have exercise induced asthma, she is working out at home lately and denies sob or wheezing   Thrombocytosis : we will recheck next visit   Patient Active Problem List   Diagnosis Date Noted   Bipolar disorder with depression ([place]) 04/10/2020   Acquired hypothyroidism 04/30/2019   Panic attack 04/13/2019   Cystic acne 01/01/2019   Human papilloma virus infection 10/02/2015    Past Surgical History:  Procedure Laterality Date   NO PAST SURGERIES     TOOTH  EXTRACTION      Family History  Problem Relation Age of Onset   Hypertension Mother    COPD Father    Depression Sister    Depression Sister    Bipolar disorder Maternal Grandmother    Bladder Cancer Maternal Grandfather    Lung cancer Paternal Grandmother    Alcoholism Paternal Grandfather    Cirrhosis Paternal Grandfather    Breast cancer Neg Hx    Ovarian cancer Neg Hx    Colon cancer Neg Hx    Diabetes Neg Hx     Social History   Tobacco Use   Smoking status: Former    Packs/day: 0.25    Years: 12.00    Pack years: 3.00    Types: Cigarettes    Quit date: 06/16/2017    Years since quitting: 4.4   Smokeless tobacco: Never  Substance Use Topics   Alcohol use: Yes    Alcohol/week: 2.0 - 3.0 standard drinks    Types: 2 - 3 Cans of beer per week    Comment: once a week     Current Outpatient Medications:    Adapalene (DIFFERIN) 0.3 % gel, Apply a pea sized amount to the entire face QHS., Disp: 45 g, Rfl: 6   albuterol (PROVENTIL HFA;VENTOLIN HFA) 108 (90 Base) MCG/ACT inhaler, Inhale into the lungs every 6 (six) hours as needed for wheezing or shortness of  breath., Disp: , Rfl:    ARIPiprazole (ABILIFY) 2 MG tablet, TAKE 1 TABLET BY MOUTH DAILY., Disp: 30 tablet, Rfl: 0   buPROPion (WELLBUTRIN XL) 150 MG 24 hr tablet, TAKE 1 TABLET BY MOUTH DAILY., Disp: 90 tablet, Rfl: 1   busPIRone (BUSPAR) 10 MG tablet, TAKE 1 TABLET BY MOUTH 2 TIMES DAILY AS NEEDED., Disp: 100 tablet, Rfl: 1   gentamicin ointment (GARAMYCIN) 0.1 %, apply to nose three times daily, Disp: 30 g, Rfl: 12   hydrOXYzine (ATARAX/VISTARIL) 25 MG tablet, Take 1 tablet (25 mg total) by mouth 3 (three) times daily as needed for anxiety., Disp: 100 tablet, Rfl: 1   ibuprofen (ADVIL) 600 MG tablet, Take 1 tablet (600 mg total) by mouth every 6 (six) hours as needed., Disp: 30 tablet, Rfl: 0   levothyroxine (SYNTHROID) 25 MCG tablet, TAKE 1 TABLET BY MOUTH DAILY BEFORE BREAKFAST, Disp: 90 tablet, Rfl: 1   Multiple  Vitamin (MULTIVITAMIN) tablet, Take 1 tablet by mouth daily., Disp: , Rfl:    Norgestimate-Ethinyl Estradiol Triphasic 0.18/0.215/0.25 MG-25 MCG tab, TAKE 1 TABLET BY MOUTH DAILY., Disp: 84 tablet, Rfl: 3   spironolactone (ALDACTONE) 50 MG tablet, Take 2 tablets by mouth every morning and 1 tablet at night as directed, Disp: 90 tablet, Rfl: 1   vitamin B-12 (CYANOCOBALAMIN) 500 MCG tablet, Take 500 mcg by mouth daily., Disp: , Rfl:    vitamin C (ASCORBIC ACID) 500 MG tablet, Take 500 mg by mouth daily., Disp: , Rfl:   No Known Allergies  I personally reviewed active problem list, medication list, allergies, family history, social history with the patient/caregiver today.   ROS  Constitutional: Negative for fever or weight change.  Respiratory: Negative for cough and shortness of breath.   Cardiovascular: Negative for chest pain or palpitations.  Gastrointestinal: Negative for abdominal pain, no bowel changes.  Musculoskeletal: Negative for gait problem or joint swelling.  Skin: Negative for rash.  Neurological: Negative for dizziness or headache.  No other specific complaints in a complete review of systems (except as listed in HPI above).   Objective  Vitals:   11/15/21 0819  BP: 116/70  Pulse: 86  Resp: 16  SpO2: 99%  Weight: 161 lb (73 kg)  Height: 5\' 9"  (1.753 m)    Body mass index is 23.78 kg/m.  Physical Exam  Constitutional: Patient appears well-developed and well-nourished.  No distress.  HEENT: head atraumatic, normocephalic, pupils equal and reactive to light,  neck supple Cardiovascular: Normal rate, regular rhythm and normal heart sounds.  No murmur heard. No BLE edema. Pulmonary/Chest: Effort normal and breath sounds normal. No respiratory distress. Abdominal: Soft.  There is no tenderness. Psychiatric: Patient has a normal mood and affect. behavior is normal. Judgment and thought content normal.   PHQ2/9: Depression screen [facility] 2/9 11/15/2021 09/25/2021  05/10/2021 04/19/2021 03/28/2021  Decreased Interest 0 0 0 0 0  Down, Depressed, Hopeless 0 0 0 0 0  PHQ - 2 Score 0 0 0 0 0  Altered sleeping 0 0 0 0 0  Tired, decreased energy 0 0 0 0 0  Change in appetite 0 0 0 0 0  Feeling bad or failure about yourself  0 0 0 0 0  Trouble concentrating 0 0 0 0 0  Moving slowly or fidgety/restless 0 0 0 0 0  Suicidal thoughts 0 0 0 0 0  PHQ-9 Score 0 0 0 0 0  Difficult doing work/chores - Not difficult at all Not difficult at all  Not difficult at all -  Some recent data might be hidden    phq 9 is negative   Fall Risk: Fall Risk  11/15/2021 09/25/2021 04/19/2021 03/28/2021 09/27/2020  Falls in the past year? 0 0 0 0 0  Number falls in past yr: 0 0 0 0 0  Injury with Fall? 0 0 0 0 0  Risk for fall due to : No Fall Risks No Fall Risks - - -  Follow up Falls prevention discussed Falls prevention discussed Falls evaluation completed - -     Functional Status Survey: Is the patient deaf or have difficulty hearing?: No Does the patient have difficulty seeing, even when wearing glasses/contacts?: No Does the patient have difficulty concentrating, remembering, or making decisions?: No Does the patient have difficulty walking or climbing stairs?: No Does the patient have difficulty dressing or bathing?: No Does the patient have difficulty doing errands alone such as visiting a doctor's office or shopping?: No    Assessment & Plan  1. Dyslipidemia  Discussed life style modification  2. Acne cystica  Controlled, she is thinking about stopping ocp since husband had vasectomy, discussed risk of mood changes and to monitor    4. Bipolar disorder with depression ([place])  - ARIPiprazole (ABILIFY) 2 MG tablet; TAKE 1 TABLET BY MOUTH DAILY.  Dispense: 90 tablet; Refill: 1 - buPROPion (WELLBUTRIN XL) 150 MG 24 hr tablet; TAKE 1 TABLET BY MOUTH DAILY.  Dispense: 90 tablet; Refill: 1 - busPIRone (BUSPAR) 10 MG tablet; TAKE 1 TABLET BY MOUTH 2 TIMES DAILY AS  NEEDED.  Dispense: 100 tablet; Refill: 1 - hydrOXYzine (ATARAX) 25 MG tablet; Take 1 tablet (25 mg total) by mouth 3 (three) times daily as needed for anxiety.  Dispense: 100 tablet; Refill: 1  5. Acquired hypothyroidism  - levothyroxine (SYNTHROID) 25 MCG tablet; TAKE 1 TABLET BY MOUTH DAILY BEFORE BREAKFAST  Dispense: 90 tablet; Refill: 1  6. Thrombocytosis  Recheck next visit  7. Panic attack  - buPROPion (WELLBUTRIN XL) 150 MG 24 hr tablet; TAKE 1 TABLET BY MOUTH DAILY.  Dispense: 90 tablet; Refill: 1 - busPIRone (BUSPAR) 10 MG tablet; TAKE 1 TABLET BY MOUTH 2 TIMES DAILY AS NEEDED.  Dispense: 100 tablet; Refill: 1 - hydrOXYzine (ATARAX) 25 MG tablet; Take 1 tablet (25 mg total) by mouth 3 (three) times daily as needed for anxiety.  Dispense: 100 tablet; Refill: 1  8. Exercise-induced asthma  Doing well at this time

## 2021-11-15 ENCOUNTER — Other Ambulatory Visit: Payer: Self-pay

## 2021-11-15 ENCOUNTER — Encounter: Payer: Self-pay | Admitting: Family Medicine

## 2021-11-15 ENCOUNTER — Ambulatory Visit (INDEPENDENT_AMBULATORY_CARE_PROVIDER_SITE_OTHER): Payer: No Typology Code available for payment source | Admitting: Family Medicine

## 2021-11-15 VITALS — BP 116/70 | HR 86 | Resp 16 | Ht 69.0 in | Wt 161.0 lb

## 2021-11-15 DIAGNOSIS — E039 Hypothyroidism, unspecified: Secondary | ICD-10-CM

## 2021-11-15 DIAGNOSIS — D75839 Thrombocytosis, unspecified: Secondary | ICD-10-CM

## 2021-11-15 DIAGNOSIS — J45990 Exercise induced bronchospasm: Secondary | ICD-10-CM | POA: Insufficient documentation

## 2021-11-15 DIAGNOSIS — E785 Hyperlipidemia, unspecified: Secondary | ICD-10-CM

## 2021-11-15 DIAGNOSIS — L700 Acne vulgaris: Secondary | ICD-10-CM

## 2021-11-15 DIAGNOSIS — F319 Bipolar disorder, unspecified: Secondary | ICD-10-CM

## 2021-11-15 DIAGNOSIS — F410 Panic disorder [episodic paroxysmal anxiety] without agoraphobia: Secondary | ICD-10-CM

## 2021-11-15 MED ORDER — BUPROPION HCL ER (XL) 150 MG PO TB24
ORAL_TABLET | Freq: Every day | ORAL | 1 refills | Status: DC
  Filled 2021-11-15: qty 90, 90d supply, fill #0
  Filled 2022-02-20: qty 90, 90d supply, fill #1

## 2021-11-15 MED ORDER — ARIPIPRAZOLE 2 MG PO TABS
ORAL_TABLET | Freq: Every day | ORAL | 1 refills | Status: DC
  Filled 2021-11-15: qty 90, 90d supply, fill #0
  Filled 2022-03-20: qty 90, 90d supply, fill #1

## 2021-11-15 MED ORDER — BUSPIRONE HCL 10 MG PO TABS
ORAL_TABLET | ORAL | 1 refills | Status: DC
Start: 2021-11-15 — End: 2022-05-22
  Filled 2021-11-15: qty 100, 50d supply, fill #0
  Filled 2022-04-25: qty 100, 50d supply, fill #1

## 2021-11-15 MED ORDER — HYDROXYZINE HCL 25 MG PO TABS
25.0000 mg | ORAL_TABLET | Freq: Three times a day (TID) | ORAL | 1 refills | Status: DC | PRN
Start: 2021-11-15 — End: 2022-05-22
  Filled 2021-11-15: qty 90, 30d supply, fill #0

## 2021-11-15 MED ORDER — LEVOTHYROXINE SODIUM 25 MCG PO TABS
ORAL_TABLET | Freq: Every day | ORAL | 1 refills | Status: DC
  Filled 2021-11-15: qty 90, 90d supply, fill #0
  Filled 2022-03-20: qty 90, 90d supply, fill #1

## 2021-11-16 ENCOUNTER — Other Ambulatory Visit: Payer: Self-pay

## 2021-11-29 ENCOUNTER — Other Ambulatory Visit: Payer: Self-pay

## 2021-11-29 ENCOUNTER — Other Ambulatory Visit: Payer: Self-pay | Admitting: Dermatology

## 2021-11-29 MED ORDER — SPIRONOLACTONE 50 MG PO TABS
ORAL_TABLET | ORAL | 3 refills | Status: DC
Start: 2021-11-29 — End: 2022-06-06
  Filled 2021-11-29: qty 90, 30d supply, fill #0
  Filled 2022-01-04: qty 90, 30d supply, fill #1
  Filled 2022-02-20: qty 90, 30d supply, fill #2
  Filled 2022-04-25: qty 90, 30d supply, fill #3

## 2021-12-28 ENCOUNTER — Other Ambulatory Visit: Payer: Self-pay

## 2021-12-28 MED ORDER — PENICILLIN V POTASSIUM 500 MG PO TABS
ORAL_TABLET | ORAL | 0 refills | Status: DC
  Filled 2021-12-28: qty 28, 7d supply, fill #0

## 2022-01-04 ENCOUNTER — Other Ambulatory Visit: Payer: Self-pay

## 2022-01-23 NOTE — Progress Notes (Signed)
Name: Jean Foster   MRN: [id number]    DOB: 06-28-1986   Date:01/24/2022 ? ?     Progress Note ? ?Subjective ? ?Chief Complaint ? ?Annual Exam ? ?HPI ? ?Patient presents for annual CPE. ? ?Diet: discussed balanced diet  ?Exercise: continue regular physical activity   ? ?[place] Office Visit from [facility]/19/20[facility] in [facility]  ?AUDIT-C Score 0  ? ?  ? ?Depression: Phq 9 is  negative ? ?  01/24/2022  ? 10:02 AM 11/15/2021  ?  8:19 AM 09/25/2021  ? 10:53 AM 05/10/2021  ?  2:14 PM 04/19/2021  ?  1:20 PM  ?Depression screen PHQ 2/9  ?Decreased Interest 0 0 0 0 0  ?Down, Depressed, Hopeless 0 0 0 0 0  ?PHQ - 2 Score 0 0 0 0 0  ?Altered sleeping 0 0 0 0 0  ?Tired, decreased energy 0 0 0 0 0  ?Change in appetite 0 0 0 0 0  ?Feeling bad or failure about yourself  0 0 0 0 0  ?Trouble concentrating 0 0 0 0 0  ?Moving slowly or fidgety/restless 0 0 0 0 0  ?Suicidal thoughts 0 0 0 0 0  ?PHQ-9 Score 0 0 0 0 0  ?Difficult doing work/chores   Not difficult at all Not difficult at all Not difficult at all  ? ?Hypertension: ?BP Readings from Last 3 Encounters:  ?01/24/22 114/68  ?11/15/21 116/70  ?09/25/21 118/70  ? ?Obesity: ?Wt Readings from Last 3 Encounters:  ?01/24/22 159 lb (72.1 kg)  ?11/15/21 161 lb (73 kg)  ?09/25/21 155 lb 14.4 oz (70.7 kg)  ? ?BMI Readings from Last 3 Encounters:  ?01/24/22 23.48 kg/m?  ?11/15/21 23.78 kg/m?  ?09/25/21 23.02 kg/m?  ?  ? ?Vaccines:  ? ?HPV: up to date  ?Tdap: up to date ?Shingrix: N/A ?Pneumonia: refused  ?Flu: up to date ?COVID-19: discussed booster  ? ? ?Hep C Screening: 04/29/19 ?STD testing and prevention (HIV/chl/gon/syphilis): 12/22/17 ?Intimate partner violence: negative screen  ?Sexual History : no pain during sex, one partner  ?Menstrual History/LMP/Abnormal Bleeding: LMP 01/11/2022 - significant other had vasectomy  ?Discussed importance of follow up if any post-menopausal bleeding: not applicable  ?Incontinence Symptoms: negative for symptoms  ? ?Breast cancer:  ?- Last  Mammogram: N/A ?- BRCA gene screening: N/A  ? ?Osteoporosis Prevention : Discussed high calcium and vitamin D supplementation, weight bearing exercises ?Bone density :not applicable  ? ?Cervical cancer screening: 05/10/21 - under the care of gyn remote history of HPV, last pap normal.  ? ?Skin cancer: Discussed monitoring for atypical lesions  ?Colorectal cancer: N/A   ?Lung cancer:  Low Dose CT Chest recommended if Age 22-80 years, 20 pack-year currently smoking OR have quit w/in 15years. Patient does not qualify for screen   ?ECG: 06/24/17 ? ?Advanced Care Planning: A voluntary discussion about advance care planning including the explanation and discussion of advance directives.  Discussed health care proxy and Living will, and the patient was able to identify a health care proxy as significant other or mother.  Patient does not have a living will and power of attorney of health care  ? ?Lipids: ?Lab Results  ?Component Value Date  ? CHOL 218 (H) 03/28/2021  ? CHOL 219 (H) 04/10/2020  ? CHOL 191 04/29/2019  ? ?Lab Results  ?Component Value Date  ? HDL 64 03/28/2021  ? HDL 56 04/10/2020  ? HDL 48 (L) 04/29/2019  ? ?Lab Results  ?Component Value Date  ?  LDLCALC 125 (H) 03/28/2021  ? [place] 123 (H) 04/10/2020  ? LDLCALC 120 (H) 04/29/2019  ? ?Lab Results  ?Component Value Date  ? TRIG 169 (H) 03/28/2021  ? TRIG 246 (H) 04/10/2020  ? TRIG 115 04/29/2019  ? ?Lab Results  ?Component Value Date  ? CHOLHDL 3.4 03/28/2021  ? CHOLHDL 3.9 04/10/2020  ? CHOLHDL 4.0 04/29/2019  ? ?No results found for: LDLDIRECT ? ?Glucose: ?Glucose, Bld  ?Date Value Ref Range Status  ?03/28/2021 83 65 - 99 mg/dL Final  ?  Comment:  ?  . ?           Fasting reference interval ?. ?  ?04/10/2020 88 65 - 139 mg/dL Final  ?  Comment:  ?  . ?       Non-fasting reference interval ?. ?  ?09/13/2019 76 65 - 99 mg/dL Final  ?  Comment:  ?  . ?           Fasting reference interval ?. ?  ? ? ?Patient Active Problem List  ? Diagnosis Date Noted  ?  Exercise-induced asthma 11/15/2021  ? Bipolar disorder with depression ([place]) 04/10/2020  ? Acquired hypothyroidism 04/30/2019  ? Panic attack 04/13/2019  ? Cystic acne 01/01/2019  ? ? ?Past Surgical History:  ?Procedure Laterality Date  ? NO PAST SURGERIES    ? TOOTH EXTRACTION    ? ? ?Family History  ?Problem Relation Age of Onset  ? Hypertension Mother   ? COPD Father   ? Depression Sister   ? Depression Sister   ? Bipolar disorder Maternal Grandmother   ? Bladder Cancer Maternal Grandfather   ? Lung cancer Paternal Grandmother   ? Alcoholism Paternal Grandfather   ? Cirrhosis Paternal Grandfather   ? Breast cancer Neg Hx   ? Ovarian cancer Neg Hx   ? Colon cancer Neg Hx   ? Diabetes Neg Hx   ? ? ?Social History  ? ?Socioeconomic History  ? Marital status: Divorced  ?  Spouse name: Not on file  ? Number of children: 2  ? Years of education: 23  ? Highest education level: Bachelor's degree (e.g., BA, AB, BS)  ?Occupational History  ? Occupation: [employer]  ?Tobacco Use  ? Smoking status: Former  ?  Packs/day: 0.25  ?  Years: 12.00  ?  Pack years: 3.00  ?  Types: Cigarettes  ?  Quit date: 06/16/2017  ?  Years since quitting: 4.6  ? Smokeless tobacco: Never  ?Vaping Use  ? Vaping Use: Never used  ?Substance and Sexual Activity  ? Alcohol use: Yes  ?  Alcohol/week: 2.0 - 3.0 standard drinks  ?  Types: 2 - 3 Cans of beer per week  ?  Comment: once a week  ? Drug use: No  ?  Comment: last used 03/2017  ? Sexual activity: Yes  ?  Partners: Male  ?  Birth control/protection: Pill  ?Other Topics Concern  ? Not on file  ?Social History Narrative  ? Lives with partner and their two sons  ? ?Social Determinants of Health  ? ?Financial Resource Strain: Low Risk   ? Difficulty of Paying Living Expenses: Not hard at all  ?Food Insecurity: No Food Insecurity  ? Worried About Charity fundraiser in the Last Year: Never true  ? Ran Out of Food in the Last Year: Never true  ?Transportation Needs: No Transportation Needs  ? Lack of  Transportation (Medical): No  ? Lack of  Transportation (Non-Medical): No  ?Physical Activity: Sufficiently Active  ? Days of Exercise per Week: 5 days  ? Minutes of Exercise per Session: 30 min  ?Stress: No Stress Concern Present  ? Feeling of Stress : Only a little  ?Social Connections: Moderately Isolated  ? Frequency of Communication with Friends and Family: Three times a week  ? Frequency of Social Gatherings with Friends and Family: Once a week  ? Attends Religious Services: Never  ? Active Member of Clubs or Organizations: No  ? Attends Archivist Meetings: Never  ? Marital Status: Living with partner  ?Intimate Partner Violence: Not At Risk  ? Fear of Current or Ex-Partner: No  ? Emotionally Abused: No  ? Physically Abused: No  ? Sexually Abused: No  ? ? ? ?Current Outpatient Medications:  ?  Adapalene (DIFFERIN) 0.3 % gel, Apply a pea sized amount to the entire face QHS., Disp: 45 g, Rfl: 6 ?  albuterol (PROVENTIL HFA;VENTOLIN HFA) 108 (90 Base) MCG/ACT inhaler, Inhale into the lungs every 6 (six) hours as needed for wheezing or shortness of breath., Disp: , Rfl:  ?  ARIPiprazole (ABILIFY) 2 MG tablet, TAKE 1 TABLET BY MOUTH DAILY., Disp: 90 tablet, Rfl: 1 ?  buPROPion (WELLBUTRIN XL) 150 MG 24 hr tablet, TAKE 1 TABLET BY MOUTH DAILY., Disp: 90 tablet, Rfl: 1 ?  busPIRone (BUSPAR) 10 MG tablet, TAKE 1 TABLET BY MOUTH 2 TIMES DAILY AS NEEDED., Disp: 100 tablet, Rfl: 1 ?  gentamicin ointment (GARAMYCIN) 0.1 %, apply to nose three times daily, Disp: 30 g, Rfl: 12 ?  hydrOXYzine (ATARAX) 25 MG tablet, Take 1 tablet (25 mg total) by mouth 3 (three) times daily as needed for anxiety., Disp: 100 tablet, Rfl: 1 ?  ibuprofen (ADVIL) 600 MG tablet, Take 1 tablet (600 mg total) by mouth every 6 (six) hours as needed., Disp: 30 tablet, Rfl: 0 ?  levothyroxine (SYNTHROID) 25 MCG tablet, TAKE 1 TABLET BY MOUTH DAILY BEFORE BREAKFAST, Disp: 90 tablet, Rfl: 1 ?  Multiple Vitamin (MULTIVITAMIN) tablet, Take 1  tablet by mouth daily., Disp: , Rfl:  ?  penicillin v potassium (VEETID) 500 MG tablet, take 1 tablet (500 mg) by oral route 4 times per day, Disp: 28 tablet, Rfl: 0 ?  spironolactone (ALDACTONE) 50 MG tablet, Rich Number

## 2022-01-23 NOTE — Patient Instructions (Signed)
Preventive Care 21-36 Years Old, Female ?Preventive care refers to lifestyle choices and visits with your health care provider that can promote health and wellness. Preventive care visits are also called wellness exams. ?What can I expect for my preventive care visit? ?Counseling ?During your preventive care visit, your health care provider may ask about your: ?Medical history, including: ?Past medical problems. ?Family medical history. ?Pregnancy history. ?Current health, including: ?Menstrual cycle. ?Method of birth control. ?Emotional well-being. ?Home life and relationship well-being. ?Sexual activity and sexual health. ?Lifestyle, including: ?Alcohol, nicotine or tobacco, and drug use. ?Access to firearms. ?Diet, exercise, and sleep habits. ?Work and work environment. ?Sunscreen use. ?Safety issues such as seatbelt and bike helmet use. ?Physical exam ?Your health care provider may check your: ?Height and weight. These may be used to calculate your BMI (body mass index). BMI is a measurement that tells if you are at a healthy weight. ?Waist circumference. This measures the distance around your waistline. This measurement also tells if you are at a healthy weight and may help predict your risk of certain diseases, such as type 2 diabetes and high blood pressure. ?Heart rate and blood pressure. ?Body temperature. ?Skin for abnormal spots. ?What immunizations do I need? ? ?Vaccines are usually given at various ages, according to a schedule. Your health care provider will recommend vaccines for you based on your age, medical history, and lifestyle or other factors, such as travel or where you work. ?What tests do I need? ?Screening ?Your health care provider may recommend screening tests for certain conditions. This may include: ?Pelvic exam and Pap test. ?Lipid and cholesterol levels. ?Diabetes screening. This is done by checking your blood sugar (glucose) after you have not eaten for a while (fasting). ?Hepatitis  B test. ?Hepatitis C test. ?HIV (human immunodeficiency virus) test. ?STI (sexually transmitted infection) testing, if you are at risk. ?BRCA-related cancer screening. This may be done if you have a family history of breast, ovarian, tubal, or peritoneal cancers. ?Talk with your health care provider about your test results, treatment options, and if necessary, the need for more tests. ?Follow these instructions at home: ?Eating and drinking ? ?Eat a healthy diet that includes fresh fruits and vegetables, whole grains, lean protein, and low-fat dairy products. ?Take vitamin and mineral supplements as recommended by your health care provider. ?Do not drink alcohol if: ?Your health care provider tells you not to drink. ?You are pregnant, may be pregnant, or are planning to become pregnant. ?If you drink alcohol: ?Limit how much you have to 0-1 drink a day. ?Know how much alcohol is in your drink. In the [place] one drink equals one 12 oz bottle of beer (355 mL), one 5 oz glass of wine (148 mL), or one 1? oz glass of hard liquor (44 mL). ?Lifestyle ?Brush your teeth every morning and night with fluoride toothpaste. Floss one time each day. ?Exercise for at least 30 minutes 5 or more days each week. ?Do not use any products that contain nicotine or tobacco. These products include cigarettes, chewing tobacco, and vaping devices, such as e-cigarettes. If you need help quitting, ask your health care provider. ?Do not use drugs. ?If you are sexually active, practice safe sex. Use a condom or other form of protection to prevent STIs. ?If you do not wish to become pregnant, use a form of birth control. If you plan to become pregnant, see your health care provider for a prepregnancy visit. ?Find healthy ways to manage stress, such as: ?Meditation,   yoga, or listening to music. ?Journaling. ?Talking to a trusted person. ?Spending time with friends and family. ?Minimize exposure to UV radiation to reduce your risk of skin  cancer. ?Safety ?Always wear your seat belt while driving or riding in a vehicle. ?Do not drive: ?If you have been drinking alcohol. Do not ride with someone who has been drinking. ?If you have been using any mind-altering substances or drugs. ?While texting. ?When you are tired or distracted. ?Wear a helmet and other protective equipment during sports activities. ?If you have firearms in your house, make sure you follow all gun safety procedures. ?Seek help if you have been physically or sexually abused. ?What's next? ?Go to your health care provider once a year for an annual wellness visit. ?Ask your health care provider how often you should have your eyes and teeth checked. ?Stay up to date on all vaccines. ?This information is not intended to replace advice given to you by your health care provider. Make sure you discuss any questions you have with your health care provider. ?Document Revised: 02/28/2021 Document Reviewed: 02/28/2021 ?Elsevier Patient Education ? [place]. ? ?

## 2022-01-24 ENCOUNTER — Ambulatory Visit (INDEPENDENT_AMBULATORY_CARE_PROVIDER_SITE_OTHER): Payer: No Typology Code available for payment source | Admitting: Family Medicine

## 2022-01-24 ENCOUNTER — Encounter: Payer: Self-pay | Admitting: Family Medicine

## 2022-01-24 VITALS — BP 114/68 | HR 89 | Resp 16 | Ht 69.0 in | Wt 159.0 lb

## 2022-01-24 DIAGNOSIS — Z131 Encounter for screening for diabetes mellitus: Secondary | ICD-10-CM

## 2022-01-24 DIAGNOSIS — Z0000 Encounter for general adult medical examination without abnormal findings: Secondary | ICD-10-CM | POA: Diagnosis not present

## 2022-01-24 DIAGNOSIS — D75839 Thrombocytosis, unspecified: Secondary | ICD-10-CM

## 2022-01-24 DIAGNOSIS — E785 Hyperlipidemia, unspecified: Secondary | ICD-10-CM

## 2022-01-24 DIAGNOSIS — E039 Hypothyroidism, unspecified: Secondary | ICD-10-CM

## 2022-01-24 DIAGNOSIS — Z79899 Other long term (current) drug therapy: Secondary | ICD-10-CM | POA: Diagnosis not present

## 2022-01-29 ENCOUNTER — Other Ambulatory Visit: Payer: Self-pay | Admitting: Family Medicine

## 2022-01-29 ENCOUNTER — Other Ambulatory Visit: Payer: Self-pay

## 2022-01-29 DIAGNOSIS — D649 Anemia, unspecified: Secondary | ICD-10-CM

## 2022-01-29 DIAGNOSIS — D519 Vitamin B12 deficiency anemia, unspecified: Secondary | ICD-10-CM

## 2022-01-29 DIAGNOSIS — E039 Hypothyroidism, unspecified: Secondary | ICD-10-CM

## 2022-01-29 DIAGNOSIS — R5383 Other fatigue: Secondary | ICD-10-CM

## 2022-01-29 MED ORDER — PENICILLIN V POTASSIUM 500 MG PO TABS
ORAL_TABLET | ORAL | 0 refills | Status: DC
  Filled 2022-01-29: qty 28, 7d supply, fill #0

## 2022-01-29 NOTE — Progress Notes (Signed)
completed

## 2022-01-31 LAB — LIPID PANEL
Cholesterol: 168 mg/dL (ref <200)
HDL: 61 mg/dL (ref >=50)
LDL Cholesterol (Calc): 86 mg/dL (calc)
Non-HDL Cholesterol (Calc): 107 mg/dL (calc) (ref <130)
Total CHOL/HDL Ratio: 2.8 (calc) (ref <5.0)
Triglycerides: 115 mg/dL (ref <150)

## 2022-01-31 LAB — COMPLETE METABOLIC PANEL WITH GFR
AG Ratio: 1.4 (calc) (ref 1.0–2.5)
ALT: 9 U/L (ref 6–29)
AST: 13 U/L (ref 10–30)
Albumin: 4.2 g/dL (ref 3.6–5.1)
Alkaline phosphatase (APISO): 48 U/L (ref 31–125)
BUN: 16 mg/dL (ref 7–25)
CO2: 27 mmol/L (ref 20–32)
Calcium: 9.5 mg/dL (ref 8.6–10.2)
Chloride: 105 mmol/L (ref 98–110)
Creat: 0.86 mg/dL (ref 0.50–0.97)
Globulin: 2.9 g/dL (calc) (ref 1.9–3.7)
Glucose, Bld: 79 mg/dL (ref 65–99)
Potassium: 4.3 mmol/L (ref 3.5–5.3)
Sodium: 140 mmol/L (ref 135–146)
Total Bilirubin: 0.2 mg/dL (ref 0.2–1.2)
Total Protein: 7.1 g/dL (ref 6.1–8.1)
eGFR: 90 mL/min/{1.73_m2} (ref >=60)

## 2022-01-31 LAB — IRON,TIBC AND FERRITIN PANEL
%SAT: 6 % (calc) — ABNORMAL LOW (ref 16–45)
Ferritin: 10 ng/mL — ABNORMAL LOW (ref 16–154)
Iron: 21 ug/dL — ABNORMAL LOW (ref 40–190)
TIBC: 366 mcg/dL (calc) (ref 250–450)

## 2022-01-31 LAB — CBC WITH DIFFERENTIAL/PLATELET
Absolute Monocytes: 812 cells/uL (ref 200–950)
Basophils Absolute: 82 cells/uL (ref 0–200)
Basophils Relative: 1 %
Eosinophils Absolute: 238 cells/uL (ref 15–500)
Eosinophils Relative: 2.9 %
HCT: 32.6 % — ABNORMAL LOW (ref 35.0–45.0)
Hemoglobin: 10.7 g/dL — ABNORMAL LOW (ref 11.7–15.5)
Lymphs Abs: 2157 cells/uL (ref 850–3900)
MCH: 28 pg (ref 27.0–33.0)
MCHC: 32.8 g/dL (ref 32.0–36.0)
MCV: 85.3 fL (ref 80.0–100.0)
MPV: 9.7 fL (ref 7.5–12.5)
Monocytes Relative: 9.9 %
Neutro Abs: 4912 cells/uL (ref 1500–7800)
Neutrophils Relative %: 59.9 %
Platelets: 356 10*3/uL (ref 140–400)
RBC: 3.82 10*6/uL (ref 3.80–5.10)
RDW: 14 % (ref 11.0–15.0)
Total Lymphocyte: 26.3 %
WBC: 8.2 10*3/uL (ref 3.8–10.8)

## 2022-01-31 LAB — TEST AUTHORIZATION

## 2022-01-31 LAB — HEMOGLOBIN A1C
Hgb A1c MFr Bld: 5.5 % of total Hgb (ref <5.7)
Mean Plasma Glucose: 111 mg/dL
eAG (mmol/L): 6.2 mmol/L

## 2022-01-31 LAB — TSH: TSH: 6.1 mIU/L — ABNORMAL HIGH

## 2022-01-31 LAB — B12 AND FOLATE PANEL
Folate: 7.4 ng/mL
Vitamin B-12: 472 pg/mL (ref 200–1100)

## 2022-02-20 ENCOUNTER — Other Ambulatory Visit: Payer: Self-pay

## 2022-03-20 ENCOUNTER — Other Ambulatory Visit: Payer: Self-pay

## 2022-03-21 ENCOUNTER — Other Ambulatory Visit: Payer: Self-pay

## 2022-03-21 ENCOUNTER — Ambulatory Visit: Payer: No Typology Code available for payment source | Admitting: Dermatology

## 2022-03-22 ENCOUNTER — Other Ambulatory Visit: Payer: Self-pay

## 2022-03-24 ENCOUNTER — Other Ambulatory Visit: Payer: Self-pay

## 2022-03-24 ENCOUNTER — Emergency Department (HOSPITAL_COMMUNITY)
Admission: EM | Admit: 2022-03-24 | Discharge: 2022-03-24 | Discharge status: Against Medical Advice | Payer: No Typology Code available for payment source | Attending: Emergency Medicine | Admitting: Emergency Medicine

## 2022-03-24 ENCOUNTER — Encounter (HOSPITAL_COMMUNITY): Payer: Self-pay | Admitting: Emergency Medicine

## 2022-03-24 ENCOUNTER — Emergency Department (HOSPITAL_COMMUNITY): Payer: No Typology Code available for payment source

## 2022-03-24 DIAGNOSIS — R61 Generalized hyperhidrosis: Secondary | ICD-10-CM | POA: Insufficient documentation

## 2022-03-24 DIAGNOSIS — Z5321 Procedure and treatment not carried out due to patient leaving prior to being seen by health care provider: Secondary | ICD-10-CM | POA: Diagnosis not present

## 2022-03-24 DIAGNOSIS — R110 Nausea: Secondary | ICD-10-CM | POA: Insufficient documentation

## 2022-03-24 DIAGNOSIS — M546 Pain in thoracic spine: Secondary | ICD-10-CM | POA: Diagnosis not present

## 2022-03-24 DIAGNOSIS — R0789 Other chest pain: Secondary | ICD-10-CM | POA: Insufficient documentation

## 2022-03-24 DIAGNOSIS — R42 Dizziness and giddiness: Secondary | ICD-10-CM | POA: Diagnosis not present

## 2022-03-24 LAB — TROPONIN I (HIGH SENSITIVITY)
Troponin I (High Sensitivity): 4 ng/L (ref <18)
Troponin I (High Sensitivity): 5 ng/L (ref <18)

## 2022-03-24 LAB — CBC
HCT: 34.5 % — ABNORMAL LOW (ref 36.0–46.0)
Hemoglobin: 11.6 g/dL — ABNORMAL LOW (ref 12.0–15.0)
MCH: 28.2 pg (ref 26.0–34.0)
MCHC: 33.6 g/dL (ref 30.0–36.0)
MCV: 83.9 fL (ref 80.0–100.0)
Platelets: 397 10*3/uL (ref 150–400)
RBC: 4.11 MIL/uL (ref 3.87–5.11)
RDW: 13 % (ref 11.5–15.5)
WBC: 8.6 10*3/uL (ref 4.0–10.5)
nRBC: 0 % (ref 0.0–0.2)

## 2022-03-24 LAB — BASIC METABOLIC PANEL
Anion gap: 9 (ref 5–15)
BUN: 16 mg/dL (ref 6–20)
CO2: 24 mmol/L (ref 22–32)
Calcium: 9.2 mg/dL (ref 8.9–10.3)
Chloride: 103 mmol/L (ref 98–111)
Creatinine, Ser: 0.89 mg/dL (ref 0.44–1.00)
GFR, Estimated: >60 mL/min (ref >60)
Glucose, Bld: 99 mg/dL (ref 70–99)
Potassium: 3.4 mmol/L — ABNORMAL LOW (ref 3.5–5.1)
Sodium: 136 mmol/L (ref 135–145)

## 2022-03-24 LAB — I-STAT BETA HCG BLOOD, ED (MC, WL, AP ONLY): I-stat hCG, quantitative: <5 m[IU]/mL (ref <5)

## 2022-03-24 NOTE — ED Notes (Signed)
Pt left with visitor.

## 2022-03-24 NOTE — ED Notes (Signed)
Called pt x3 for vitals, no response. 

## 2022-03-24 NOTE — ED Triage Notes (Signed)
Patient reports intermittent left chest pain radiating to left arm and left upper back with nausea /diaphoresis and lightheadedness onset last week . No cough or fever.

## 2022-04-25 ENCOUNTER — Other Ambulatory Visit: Payer: Self-pay

## 2022-04-26 ENCOUNTER — Other Ambulatory Visit: Payer: Self-pay

## 2022-05-21 NOTE — Progress Notes (Unsigned)
Name: Jean Foster   MRN: [id number]    DOB: 04-03-1986   Date:05/22/2022       Progress Note  Subjective  Chief Complaint  Follow Up  HPI  Hypothyroid -  taking 78mg daily  , no change in bowel movements, dry skin, dysphagia  or palpitation.  TSH has been stable, however she has gained 12 lbs since May and we will recheck TSH   Bipolar Disorder  and GAD with panic attacks: she states she has a long history of depression diagnosed with bipolar about 10 years ago ( [place] She states manic episodes included being promiscuous, doing drugs - smoking weed and taking random pills.  She states symptoms were severe and had to take medications in College - she states initially diagnosed with bipolar depression. She had highs and lows, she had suicidal thoughts , she saw a psychiatrist in the past , tried to come off medication but did not do well.She states she tried lexapro but it caused sexual dysfunction . She is taking Buspar once a day and Hydroxyzine prn panic attacks for prn sleep  She is now on Wellbutrin, Abilify for mood management and doing well. phq 9 continues to be normal and she feels well. 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 She works three shifts per week  No problems or side effects of medication  Acne: doing well on topical medication and spironolactone, she is very pleased with results Currently no problems    Asthma: used to have exercise induced asthma, she is working out at home lately and denies cough , sob or wheezing   Iron deficiency anemia: she states when thyroid was off her cycles were every two weeks for a period of time, normal flow. She takes a MVI but no iron supplementation. Denies pica, no SOB Not on ocp or IUD since husband had vasectomy   Patient Active Problem List   Diagnosis Date Noted   Exercise-induced asthma 11/15/2021   B[place] disorder [place] depression [place] 04/10/2020   Acquired hypothyroidism  04/30/2019   Panic attack 04/13/2019   Cystic acne 01/01/2019    Past Surgical History:  Procedure Laterality Date   NO PAST SURGERIES     TOOTH EXTRACTION      Family History  Problem Relation Age of Onset   Hypertension Mother    COPD Father    Depression Sister    Depression Sister    Bipolar disorder Maternal Grandmother    Bladder Cancer Maternal Grandfather    Lung cancer Paternal Grandmother    Alcoholism Paternal Grandfather    Cirrhosis Paternal Grandfather    Breast cancer Neg Hx    Ovarian cancer Neg Hx    Colon cancer Neg Hx    Diabetes Neg Hx     Social History   Tobacco Use   Smoking status: Former    Packs/day: 0.25    Years: 12.00    Total pack years: 3.00    Types: Cigarettes    Quit date: 06/16/2017    Years since quitting: 4.9   Smokeless tobacco: Never  Substance Use Topics   Alcohol use: Yes    Alcohol/week: 2.0 - 3.0 standard drinks of alcohol    Types: 2 - 3 Cans of beer per week    Comment: once a week     Current Outpatient Medications:    Adapalene (DIFFERIN) 0.3 % gel, Apply a pea sized amount to the  entire face QHS., Disp: 45 g, Rfl: 6   albuterol (PROVENTIL HFA;VENTOLIN HFA) 108 (90 Base) MCG/ACT inhaler, Inhale into the lungs every 6 (six) hours as needed for wheezing or shortness of breath., Disp: , Rfl:    ARIPiprazole (ABILIFY) 2 MG tablet, TAKE 1 TABLET BY MOUTH DAILY., Disp: 90 tablet, Rfl: 1   buPROPion (WELLBUTRIN XL) 150 MG 24 hr tablet, TAKE 1 TABLET BY MOUTH DAILY., Disp: 90 tablet, Rfl: 1   busPIRone (BUSPAR) 10 MG tablet, TAKE 1 TABLET BY MOUTH 2 TIMES DAILY AS NEEDED., Disp: 100 tablet, Rfl: 1   gentamicin ointment (GARAMYCIN) 0.1 %, apply to nose three times daily, Disp: 30 g, Rfl: 12   hydrOXYzine (ATARAX) 25 MG tablet, Take 1 tablet (25 mg total) by mouth 3 (three) times daily as needed for anxiety., Disp: 100 tablet, Rfl: 1   ibuprofen (ADVIL) 600 MG tablet, Take 1 tablet (600 mg total) by mouth every 6 (six) hours  as needed., Disp: 30 tablet, Rfl: 0   levothyroxine (SYNTHROID) 25 MCG tablet, TAKE 1 TABLET BY MOUTH DAILY BEFORE BREAKFAST, Disp: 90 tablet, Rfl: 1   Multiple Vitamin (MULTIVITAMIN) tablet, Take 1 tablet by mouth daily., Disp: , Rfl:    penicillin v potassium (VEETID) 500 MG tablet, take 1 tablet (500 mg) by oral route 4 times per day, Disp: 28 tablet, Rfl: 0   spironolactone (ALDACTONE) 50 MG tablet, Take 2 tablets by mouth every morning and 1 tablet at night as directed, Disp: 90 tablet, Rfl: 3   vitamin B-12 (CYANOCOBALAMIN) 500 MCG tablet, Take 500 mcg by mouth daily., Disp: , Rfl:    vitamin C (ASCORBIC ACID) 500 MG tablet, Take 500 mg by mouth daily., Disp: , Rfl:   No Known Allergies  I personally reviewed active problem list, medication list, allergies, family history, social history, health maintenance with the patient/caregiver today.   ROS  Constitutional: Negative for fever or weight change.  Respiratory: Negative for cough and shortness of breath.   Cardiovascular: Negative for chest pain or palpitations.  Gastrointestinal: Negative for abdominal pain, no bowel changes.  Musculoskeletal: Negative for gait problem or joint swelling.  Skin: Negative for rash.  Neurological: Negative for dizziness or headache.  No other specific complaints in a complete review of systems (except as listed in HPI above).   Objective  Vitals:   05/22/22 0744  BP: 126/82  Pulse: (!) 106  Resp: 16  SpO2: 100%  Weight: 171 lb (77.6 kg)  Height: '5\' 9"'$  (1.753 m)    Body mass index is 25.25 kg/m.  Physical Exam  Constitutional: Patient appears well-developed and well-nourished.  No distress.  HEENT: head atraumatic, normocephalic, pupils equal and reactive to light, neck supple Cardiovascular: Normal rate, regular rhythm and normal heart sounds.  No murmur heard. No BLE edema. Pulmonary/Chest: Effort normal and breath sounds normal. No respiratory distress. Abdominal: Soft.  There is  no tenderness. Psychiatric: Patient has a normal mood and affect. behavior is normal. Judgment and thought content normal.   Recent Results (from the past 2160 hour(s))  Basic metabolic panel     Status: Abnormal   Collection Time: 03/24/22 12:47 AM  Result Value Ref Range   Sodium 136 135 - 145 mmol/L   Potassium 3.4 (L) 3.5 - 5.1 mmol/L   Chloride 103 98 - 111 mmol/L   CO2 24 22 - 32 mmol/L   Glucose, Bld 99 70 - 99 mg/dL    Comment: Glucose reference range applies only to  samples taken after fasting for at least 8 hours.   BUN 16 6 - 20 mg/dL   Creatinine, Ser 0.89 0.44 - 1.00 mg/dL   Calcium 9.2 8.9 - 10.3 mg/dL   GFR, Estimated >60 >60 mL/min    Comment: (NOTE) Calculated using the CKD-EPI Creatinine Equation (2021)    Anion gap 9 5 - 15    Comment: Performed at [facility] [address], [place], [place] [address]  CBC     Status: Abnormal   Collection Time: 03/24/22 12:47 AM  Result Value Ref Range   WBC 8.6 4.0 - 10.5 K/uL   RBC 4.11 3.87 - 5.11 MIL/uL   Hemoglobin 11.6 (L) 12.0 - 15.0 g/dL   HCT 34.5 (L) 36.0 - 46.0 %   MCV 83.9 80.0 - 100.0 fL   MCH 28.2 26.0 - 34.0 pg   MCHC 33.6 30.0 - 36.0 g/dL   RDW 13.0 11.5 - 15.5 %   Platelets 397 150 - 400 K/uL   nRBC 0.0 0.0 - 0.2 %    Comment: Performed at [facility], [place] [address] [place], [place] [address]  Troponin I (High Sensitivity)     Status: None   Collection Time: 03/24/22 12:47 AM  Result Value Ref Range   Troponin I (High Sensitivity) 4 <18 ng/L    Comment: (NOTE) Elevated high sensitivity troponin I (hsTnI) values and significant  changes across serial measurements may suggest ACS but many other  chronic and acute conditions are known to elevate hsTnI results.  Refer to the "Links" section for chest pain algorithms and additional  guidance. Performed at [facility], [place] [address], [place], [place] [address]   I-Stat beta hCG blood, ED     Status: None   Collection Time:  03/24/22  1:22 AM  Result Value Ref Range   I-stat hCG, quantitative <5.0 <5 mIU/mL   Comment 3            Comment:   GEST. AGE      CONC.  (mIU/mL)   <=1 WEEK        5 - 50     2 WEEKS       50 - 500     3 WEEKS       100 - 10,000     4 WEEKS     1,000 - 30,000        FEMALE AND NON-PREGNANT FEMALE:     LESS THAN 5 mIU/mL   Troponin I (High Sensitivity)     Status: None   Collection Time: 03/24/22  2:41 AM  Result Value Ref Range   Troponin I (High Sensitivity) 5 <18 ng/L    Comment: (NOTE) Elevated high sensitivity troponin I (hsTnI) values and significant  changes across serial measurements may suggest ACS but many other  chronic and acute conditions are known to elevate hsTnI results.  Refer to the "Links" section for chest pain algorithms and additional  guidance. Performed at [facility], [place] [address] [place], [place] [address]     PHQ2/9:    05/22/2022    7:43 AM 01/24/2022   10:02 AM 11/15/2021    8:19 AM 09/25/2021   10:53 AM 05/10/2021    2:14 PM  Depression screen PHQ 2/9  Decreased Interest 0 0 0 0 0  Down, Depressed, Hopeless 0 0 0 0 0  PHQ - 2 Score 0 0 0 0 0  Altered sleeping  0 0 0 0 0  Tired, decreased energy 0 0 0 0 0  Change in appetite 0 0 0 0 0  Feeling bad or failure about yourself  0 0 0 0 0  Trouble concentrating 0 0 0 0 0  Moving slowly or fidgety/restless 0 0 0 0 0  Suicidal thoughts 0 0 0 0 0  PHQ-9 Score 0 0 0 0 0  Difficult doing work/chores    Not difficult at all Not difficult at all    phq 9 is negative   Fall Risk:    05/22/2022    7:43 AM 01/24/2022   10:02 AM 11/15/2021    8:19 AM 09/25/2021   10:53 AM 04/19/2021    1:20 PM  Fall Risk   Falls in the past year? 0 0 0 0 0  Number falls in past yr: 0 0 0 0 0  Injury with Fall? 0 0 0 0 0  Risk for fall due to : No Fall Risks No Fall Risks No Fall Risks No Fall Risks   Follow up Falls prevention discussed Falls prevention discussed Falls prevention discussed Falls prevention  discussed Falls evaluation completed      Functional Status Survey: Is the patient deaf or have difficulty hearing?: No Does the patient have difficulty seeing, even when wearing glasses/contacts?: No Does the patient have difficulty concentrating, remembering, or making decisions?: No Does the patient have difficulty walking or climbing stairs?: No Does the patient have difficulty dressing or bathing?: No Does the patient have difficulty doing errands alone such as visiting a doctor's office or shopping?: No    Assessment & Plan  1. Iron deficiency anemia due to chronic blood loss  - CBC with Differential/Platelet  2. Bipolar disorder with depression ([place])  - ARIPiprazole (ABILIFY) 2 MG tablet; TAKE 1 TABLET BY MOUTH DAILY.  Dispense: 90 tablet; Refill: 1 - buPROPion (WELLBUTRIN XL) 150 MG 24 hr tablet; TAKE 1 TABLET BY MOUTH DAILY.  Dispense: 90 tablet; Refill: 1 - busPIRone (BUSPAR) 10 MG tablet; TAKE 1 TABLET BY MOUTH 2 TIMES DAILY AS NEEDED.  Dispense: 100 tablet; Refill: 1 - hydrOXYzine (ATARAX) 25 MG tablet; Take 1 tablet (25 mg total) by mouth 3 (three) times daily as needed for anxiety.  Dispense: 100 tablet; Refill: 1  3. Exercise-induced asthma   4. Acquired hypothyroidism  - TSH  5. Panic attack  - buPROPion (WELLBUTRIN XL) 150 MG 24 hr tablet; TAKE 1 TABLET BY MOUTH DAILY.  Dispense: 90 tablet; Refill: 1 - busPIRone (BUSPAR) 10 MG tablet; TAKE 1 TABLET BY MOUTH 2 TIMES DAILY AS NEEDED.  Dispense: 100 tablet; Refill: 1 - hydrOXYzine (ATARAX) 25 MG tablet; Take 1 tablet (25 mg total) by mouth 3 (three) times daily as needed for anxiety.  Dispense: 100 tablet; Refill: 1  6. Acne vulgaris

## 2022-05-22 ENCOUNTER — Other Ambulatory Visit: Payer: Self-pay

## 2022-05-22 ENCOUNTER — Encounter: Payer: Self-pay | Admitting: Family Medicine

## 2022-05-22 ENCOUNTER — Ambulatory Visit (INDEPENDENT_AMBULATORY_CARE_PROVIDER_SITE_OTHER): Payer: No Typology Code available for payment source | Admitting: Family Medicine

## 2022-05-22 VITALS — BP 126/82 | HR 106 | Resp 16 | Ht 69.0 in | Wt 171.0 lb

## 2022-05-22 DIAGNOSIS — D500 Iron deficiency anemia secondary to blood loss (chronic): Secondary | ICD-10-CM | POA: Insufficient documentation

## 2022-05-22 DIAGNOSIS — J45990 Exercise induced bronchospasm: Secondary | ICD-10-CM | POA: Diagnosis not present

## 2022-05-22 DIAGNOSIS — E039 Hypothyroidism, unspecified: Secondary | ICD-10-CM | POA: Diagnosis not present

## 2022-05-22 DIAGNOSIS — F319 Bipolar disorder, unspecified: Secondary | ICD-10-CM | POA: Diagnosis not present

## 2022-05-22 DIAGNOSIS — L700 Acne vulgaris: Secondary | ICD-10-CM

## 2022-05-22 DIAGNOSIS — F410 Panic disorder [episodic paroxysmal anxiety] without agoraphobia: Secondary | ICD-10-CM

## 2022-05-22 MED ORDER — ARIPIPRAZOLE 2 MG PO TABS
ORAL_TABLET | Freq: Every day | ORAL | 1 refills | Status: DC
  Filled 2022-05-22: qty 90, fill #0

## 2022-05-22 MED ORDER — HYDROXYZINE HCL 25 MG PO TABS
25.0000 mg | ORAL_TABLET | Freq: Three times a day (TID) | ORAL | 1 refills | Status: AC | PRN
  Filled 2022-05-22: qty 90, 30d supply, fill #0
  Filled 2022-12-25: qty 100, 34d supply, fill #0

## 2022-05-22 MED ORDER — BUPROPION HCL ER (XL) 150 MG PO TB24
ORAL_TABLET | Freq: Every day | ORAL | 1 refills | Status: DC
  Filled 2022-05-22 – 2022-06-20 (×2): qty 90, 90d supply, fill #0
  Filled 2022-10-02: qty 90, 90d supply, fill #1

## 2022-05-22 MED ORDER — BUSPIRONE HCL 10 MG PO TABS
ORAL_TABLET | ORAL | 1 refills | Status: DC
  Filled 2022-05-22: qty 100, fill #0
  Filled 2022-07-30: qty 100, 50d supply, fill #0
  Filled 2022-10-02: qty 100, 50d supply, fill #1

## 2022-05-23 ENCOUNTER — Other Ambulatory Visit: Payer: Self-pay

## 2022-05-23 ENCOUNTER — Other Ambulatory Visit: Payer: Self-pay | Admitting: Family Medicine

## 2022-05-23 DIAGNOSIS — E039 Hypothyroidism, unspecified: Secondary | ICD-10-CM

## 2022-05-23 LAB — CBC WITH DIFFERENTIAL/PLATELET
Absolute Monocytes: 818 cells/uL (ref 200–950)
Basophils Absolute: 61 cells/uL (ref 0–200)
Basophils Relative: 0.7 %
Eosinophils Absolute: 113 cells/uL (ref 15–500)
Eosinophils Relative: 1.3 %
HCT: 36.2 % (ref 35.0–45.0)
Hemoglobin: 12.3 g/dL (ref 11.7–15.5)
Lymphs Abs: 2384 cells/uL (ref 850–3900)
MCH: 27.9 pg (ref 27.0–33.0)
MCHC: 34 g/dL (ref 32.0–36.0)
MCV: 82.1 fL (ref 80.0–100.0)
MPV: 9.3 fL (ref 7.5–12.5)
Monocytes Relative: 9.4 %
Neutro Abs: 5324 cells/uL (ref 1500–7800)
Neutrophils Relative %: 61.2 %
Platelets: 397 10*3/uL (ref 140–400)
RBC: 4.41 10*6/uL (ref 3.80–5.10)
RDW: 13.3 % (ref 11.0–15.0)
Total Lymphocyte: 27.4 %
WBC: 8.7 10*3/uL (ref 3.8–10.8)

## 2022-05-23 LAB — TSH: TSH: 1.85 mIU/L

## 2022-05-23 MED ORDER — LEVOTHYROXINE SODIUM 25 MCG PO TABS
ORAL_TABLET | Freq: Every day | ORAL | 1 refills | Status: DC
  Filled 2022-05-23: qty 90, fill #0
  Filled 2022-06-20: qty 90, 90d supply, fill #0
  Filled 2022-10-02: qty 90, 90d supply, fill #1

## 2022-06-06 ENCOUNTER — Ambulatory Visit: Payer: No Typology Code available for payment source | Admitting: Dermatology

## 2022-06-06 ENCOUNTER — Other Ambulatory Visit: Payer: Self-pay

## 2022-06-06 VITALS — BP 95/68 | HR 83

## 2022-06-06 DIAGNOSIS — Z1283 Encounter for screening for malignant neoplasm of skin: Secondary | ICD-10-CM

## 2022-06-06 DIAGNOSIS — L814 Other melanin hyperpigmentation: Secondary | ICD-10-CM | POA: Diagnosis not present

## 2022-06-06 DIAGNOSIS — D1801 Hemangioma of skin and subcutaneous tissue: Secondary | ICD-10-CM

## 2022-06-06 DIAGNOSIS — D229 Melanocytic nevi, unspecified: Secondary | ICD-10-CM

## 2022-06-06 DIAGNOSIS — L700 Acne vulgaris: Secondary | ICD-10-CM | POA: Diagnosis not present

## 2022-06-06 DIAGNOSIS — L578 Other skin changes due to chronic exposure to nonionizing radiation: Secondary | ICD-10-CM

## 2022-06-06 DIAGNOSIS — L821 Other seborrheic keratosis: Secondary | ICD-10-CM

## 2022-06-06 MED ORDER — SPIRONOLACTONE 50 MG PO TABS
ORAL_TABLET | ORAL | 11 refills | Status: DC
  Filled 2022-06-06: qty 90, 30d supply, fill #0
  Filled 2022-06-06: qty 90, fill #0
  Filled 2022-10-02: qty 90, 30d supply, fill #1
  Filled 2022-12-06 (×2): qty 90, 30d supply, fill #2
  Filled 2023-02-03: qty 90, 30d supply, fill #3
  Filled 2023-03-27: qty 90, 30d supply, fill #4
  Filled 2023-05-29: qty 90, 30d supply, fill #5

## 2022-06-06 MED ORDER — ADAPALENE 0.3 % EX GEL
CUTANEOUS | 6 refills | Status: DC
  Filled 2022-06-06 – 2022-06-20 (×2): qty 45, 30d supply, fill #0

## 2022-06-06 NOTE — Patient Instructions (Signed)
Due to recent changes in healthcare laws, you may see results of your pathology and/or laboratory studies on MyChart before the doctors have had a chance to review them. We understand that in some cases there may be results that are confusing or concerning to you. Please understand that not all results are received at the same time and often the doctors may need to interpret multiple results in order to provide you with the best plan of care or course of treatment. Therefore, we ask that you please give us 2 business days to thoroughly review all your results before contacting the office for clarification. Should we see a critical lab result, you will be contacted sooner.   If You Need Anything After Your Visit  If you have any questions or concerns for your doctor, please call our main line at [phone] and press option 4 to reach your doctor's medical assistant. If no one answers, please leave a voicemail as directed and we will return your call as soon as possible. Messages left after 4 pm will be answered the following business day.   You may also send us a message via MyChart. We typically respond to MyChart messages within 1-2 business days.  For prescription refills, please ask your pharmacy to contact our office. Our fax number is [phone].  If you have an urgent issue when the clinic is closed that cannot wait until the next business day, you can page your doctor at the number below.    Please note that while we do our best to be available for urgent issues outside of office hours, we are not available 24/7.   If you have an urgent issue and are unable to reach us, you may choose to seek medical care at your doctor's office, retail clinic, urgent care center, or emergency room.  If you have a medical emergency, please immediately call 911 or go to the emergency department.  Pager Numbers  - Dr. Kowalski: [phone]  - Dr. Moye: [phone]  - Dr. Stewart:  [phone]  In the event of inclement weather, please call our main line at [phone] for an update on the status of any delays or closures.  Dermatology Medication Tips: Please keep the boxes that topical medications come in in order to help keep track of the instructions about where and how to use these. Pharmacies typically print the medication instructions only on the boxes and not directly on the medication tubes.   If your medication is too expensive, please contact our office at [phone] option 4 or send us a message through MyChart.   We are unable to tell what your co-pay for medications will be in advance as this is different depending on your insurance coverage. However, we may be able to find a substitute medication at lower cost or fill out paperwork to get insurance to cover a needed medication.   If a prior authorization is required to get your medication covered by your insurance company, please allow us 1-2 business days to complete this process.  Drug prices often vary depending on where the prescription is filled and some pharmacies may offer cheaper prices.  The website www.goodrx.com contains coupons for medications through different pharmacies. The prices here do not account for what the cost may be with help from insurance (it may be cheaper with your insurance), but the website can give you the price if you did not use any insurance.  - You can print the associated coupon and take it with   your prescription to the pharmacy.  - You may also stop by our office during regular business hours and pick up a GoodRx coupon card.  - If you need your prescription sent electronically to a different pharmacy, notify our office through [facility] MyChart or by phone at [phone] option 4.     Si Usted Necesita Algo Despus de Su Visita  Tambin puede enviarnos un mensaje a travs de MyChart. Por lo general respondemos a los mensajes de MyChart en el transcurso de 1 a 2  das hbiles.  Para renovar recetas, por favor pida a su farmacia que se ponga en contacto con nuestra oficina. Nuestro nmero de fax es el [phone].  Si tiene un asunto urgente cuando la clnica est cerrada y que no puede esperar hasta el siguiente da hbil, puede llamar/localizar a su doctor(a) al nmero que aparece a continuacin.   Por favor, tenga en cuenta que aunque hacemos todo lo posible para estar disponibles para asuntos urgentes fuera del horario de oficina, no estamos disponibles las 24 horas del da, los 7 das de la semana.   Si tiene un problema urgente y no puede comunicarse con nosotros, puede optar por buscar atencin mdica  en el consultorio de su doctor(a), en una clnica privada, en un centro de atencin urgente o en una sala de emergencias.  Si tiene una emergencia mdica, por favor llame inmediatamente al 911 o vaya a la sala de emergencias.  Nmeros de bper  - Dr. Kowalski: [phone]  - Dra. Moye: [phone]  - Dra. Stewart: [phone]  En caso de inclemencias del tiempo, por favor llame a nuestra lnea principal al [phone] para una actualizacin sobre el estado de cualquier retraso o cierre.  Consejos para la medicacin en dermatologa: Por favor, guarde las cajas en las que vienen los medicamentos de uso tpico para ayudarle a seguir las instrucciones sobre dnde y cmo usarlos. Las farmacias generalmente imprimen las instrucciones del medicamento slo en las cajas y no directamente en los tubos del medicamento.   Si su medicamento es muy caro, por favor, pngase en contacto con nuestra oficina llamando al [phone] y presione la opcin 4 o envenos un mensaje a travs de MyChart.   No podemos decirle cul ser su copago por los medicamentos por adelantado ya que esto es diferente dependiendo de la cobertura de su seguro. Sin embargo, es posible que podamos encontrar un medicamento sustituto a menor costo o llenar un formulario para que el  seguro cubra el medicamento que se considera necesario.   Si se requiere una autorizacin previa para que su compaa de seguros cubra su medicamento, por favor permtanos de 1 a 2 das hbiles para completar este proceso.  Los precios de los medicamentos varan con frecuencia dependiendo del lugar de dnde se surte la receta y alguna farmacias pueden ofrecer precios ms baratos.  El sitio web www.goodrx.com tiene cupones para medicamentos de diferentes farmacias. Los precios aqu no tienen en cuenta lo que podra costar con la ayuda del seguro (puede ser ms barato con su seguro), pero el sitio web puede darle el precio si no utiliz ningn seguro.  - Puede imprimir el cupn correspondiente y llevarlo con su receta a la farmacia.  - Tambin puede pasar por nuestra oficina durante el horario de atencin regular y recoger una tarjeta de cupones de GoodRx.  - Si necesita que su receta se enve electrnicamente a una farmacia diferente, informe a nuestra oficina a travs de MyChart de [facility]   o por telfono llamando al [phone] y presione la opcin 4.  

## 2022-06-06 NOTE — Progress Notes (Signed)
Follow-Up Visit   Subjective  Jean Foster is a 36 y.o. female who presents for the following: Annual Exam and Acne (Patient currently using Spironolactone '50mg'$  2 po QAM and 1 tab po QHS sometimes alternating which dose she takes at night and in the morning, and Adapalene 0.3% gel QHS. Patient tolerating medications well with no dizziness/light headedness associated with Spironolactone). The patient presents for Total-Body Skin Exam (TBSE) for skin cancer screening and mole check.  The patient has spots, moles and lesions to be evaluated, some may be new or changing.    The following portions of the chart were reviewed this encounter and updated as appropriate:   Tobacco  Allergies  Meds  Problems  Med Hx  Surg Hx  Fam Hx      Review of Systems:  No other skin or systemic complaints except as noted in HPI or Assessment and Plan.  Objective  Well appearing patient in no apparent distress; mood and affect are within normal limits.  A full examination was performed including scalp, head, eyes, ears, nose, lips, neck, chest, axillae, abdomen, back, buttocks, bilateral upper extremities, bilateral lower extremities, hands, feet, fingers, toes, fingernails, and toenails. All findings within normal limits unless otherwise noted below.  Face Rare open comedone    Assessment & Plan  Acne vulgaris Face  Chronic condition with duration or expected duration over one year. Currently well-controlled.  Continue Spironolactone '50mg'$  2 po QAM and 1 po QHS. Spironolactone can cause increased urination and cause blood pressure to decrease. Please watch for signs of lightheadedness and be cautious when changing position. It can sometimes cause breast tenderness or an irregular period in premenopausal women. It can also increase potassium. The increase in potassium usually is not a concern unless you are taking other medicines that also increase potassium, so please be sure your doctor knows all  of the other medications you are taking. This medication should not be taken by pregnant women.  This medicine should also not be taken together with sulfa drugs like Bactrim (trimethoprim/sulfamethexazole).   Continue Adapalene 0.3% gel QHS. Topical retinoid medications like tretinoin/Retin-A, adapalene/Differin, tazarotene/Fabior, and Epiduo/Epiduo Forte can cause dryness and irritation when first started. Only apply a pea-sized amount to the entire affected area. Avoid applying it around the eyes, edges of mouth and creases at the nose. If you experience irritation, use a good moisturizer first and/or apply the medicine less often. If you are doing well with the medicine, you can increase how often you use it until you are applying every night. Be careful with sun protection while using this medication as it can make you sensitive to the sun. This medicine should not be used by pregnant women.     Related Medications spironolactone (ALDACTONE) 50 MG tablet Take 2 tablets by mouth every morning and 1 tablet at night as directed  Adapalene (DIFFERIN) 0.3 % gel Apply a pea sized amount to the entire face QHS.   Lentigines - Scattered tan macules - Due to sun exposure - Benign-appearing, observe - Recommend daily broad spectrum sunscreen SPF 30+ to sun-exposed areas, reapply every 2 hours as needed. - Call for any changes  Seborrheic Keratoses - Stuck-on, waxy, tan-brown papules and/or plaques  - Benign-appearing - Discussed benign etiology and prognosis. - Observe - Call for any changes  Melanocytic Nevi - Tan-brown and/or pink-flesh-colored symmetric macules and papules - Benign appearing on exam today - Observation - Call clinic for new or changing moles - Recommend daily use  of broad spectrum spf 30+ sunscreen to sun-exposed areas.   Hemangiomas - Red papules - Discussed benign nature - Observe - Call for any changes  Actinic Damage - Chronic condition, secondary to  cumulative UV/sun exposure - diffuse scaly erythematous macules with underlying dyspigmentation - Recommend daily broad spectrum sunscreen SPF 30+ to sun-exposed areas, reapply every 2 hours as needed.  - Staying in the shade or wearing long sleeves, sun glasses (UVA+UVB protection) and wide brim hats (4-inch brim around the entire circumference of the hat) are also recommended for sun protection.  - Call for new or changing lesions.  Skin cancer screening performed today.  Return in about 1 year (around 06/07/2023) for TBSE and acne follow up.  Luther Redo, CMA, am acting as scribe for Forest Gleason, MD .   Documentation: I have reviewed the above documentation for accuracy and completeness, and I agree with the above.  Forest Gleason, MD

## 2022-06-08 ENCOUNTER — Encounter: Payer: Self-pay | Admitting: Dermatology

## 2022-06-14 ENCOUNTER — Other Ambulatory Visit: Payer: Self-pay

## 2022-06-20 ENCOUNTER — Other Ambulatory Visit: Payer: Self-pay

## 2022-06-25 ENCOUNTER — Other Ambulatory Visit: Payer: Self-pay

## 2022-07-30 ENCOUNTER — Other Ambulatory Visit: Payer: Self-pay

## 2022-09-26 ENCOUNTER — Other Ambulatory Visit: Payer: Self-pay

## 2022-11-20 ENCOUNTER — Ambulatory Visit: Payer: No Typology Code available for payment source | Admitting: Family Medicine

## 2022-12-06 ENCOUNTER — Other Ambulatory Visit: Payer: Self-pay

## 2022-12-25 ENCOUNTER — Other Ambulatory Visit: Payer: Self-pay | Admitting: Family Medicine

## 2022-12-25 ENCOUNTER — Other Ambulatory Visit: Payer: Self-pay

## 2022-12-25 DIAGNOSIS — F319 Bipolar disorder, unspecified: Secondary | ICD-10-CM

## 2022-12-25 DIAGNOSIS — E039 Hypothyroidism, unspecified: Secondary | ICD-10-CM

## 2022-12-25 DIAGNOSIS — F410 Panic disorder [episodic paroxysmal anxiety] without agoraphobia: Secondary | ICD-10-CM

## 2022-12-25 NOTE — Telephone Encounter (Signed)
Pt now has an appt for April 15, so rx can be refilled now.

## 2022-12-26 ENCOUNTER — Other Ambulatory Visit: Payer: Self-pay

## 2022-12-26 MED ORDER — BUSPIRONE HCL 10 MG PO TABS
10.0000 mg | ORAL_TABLET | Freq: Two times a day (BID) | ORAL | 0 refills | Status: DC | PRN
  Filled 2022-12-26: qty 10, 5d supply, fill #0

## 2022-12-26 MED ORDER — BUPROPION HCL ER (XL) 150 MG PO TB24
150.0000 mg | ORAL_TABLET | Freq: Every day | ORAL | 0 refills | Status: DC
  Filled 2022-12-26: qty 5, 5d supply, fill #0

## 2022-12-26 MED ORDER — LEVOTHYROXINE SODIUM 25 MCG PO TABS
25.0000 ug | ORAL_TABLET | Freq: Every day | ORAL | 0 refills | Status: DC
  Filled 2022-12-26: qty 30, 30d supply, fill #0

## 2022-12-27 NOTE — Progress Notes (Unsigned)
Name: Jean Foster   MRN: [id number]    DOB: [birth year]   Date:12/27/2022       Progress Note  Subjective  Chief Complaint  Medication Refill  I connected with  Darien Ramus  on 12/27/22 at 10:00 AM EDT by a video enabled telemedicine application and verified that I am speaking with the correct person using two identifiers.  I discussed the limitations of evaluation and management by telemedicine and the availability of in person appointments. The patient expressed understanding and agreed to proceed with the virtual visit  Staff also discussed with the patient that there may be a patient responsible charge related to this service. Patient Location: at home  Provider Location: [facility] Additional Individuals present: alone   HPI  Hypothyroid -  taking daily  , no change in bowel movements, dry skin, dysphagia  or palpitation.  TSH has been stable, advised her to recheck labs before her next visit . Advised to try to come in person and we may be able to extend her visits to every 6 months    Bipolar Disorder  and GAD with panic attacks: she states she has a long history of depression diagnosed with bipolar about 11 years ago ( while in [place]). She states manic episodes included being promiscuous, doing drugs - smoking weed and taking random pills.  She states symptoms were severe and had to take medications in College - she states initially diagnosed with bipolar depression. She had highs and lows, she had suicidal thoughts , she saw a psychiatrist in the past , tried to come off medication but did not do well.She states she tried lexapro but it caused sexual dysfunction . She stopped taking Abilify on her own and does not want to resume medications. She is currently taking welbutrin, buspar bid and hydroxizine prn. She states her mood is stable and does not want to change current regiment   Dyslipidemia:levels were abnormal with high triglycerides and LDL but last levels were  normal, likely due to normal TSH  Acne: doing well on topical medication and spironolactone, she is very pleased with results Under the care of Dr. Neale Burly   Asthma: used to have exercise induced asthma, she needs a refill of albuterol   Iron deficiency anemia: she states when thyroid was off her cycles were every two weeks for a period of time, normal flow. She takes a MVI but no iron supplementation. Not on ocp or IUD since husband had vasectomy . She denies pica , SOB . Discussed a high iron diet   Patient Active Problem List   Diagnosis Date Noted   Iron deficiency anemia due to chronic blood loss 05/22/2022   Exercise-induced asthma 11/15/2021   Bipolar disorder with depression 04/10/2020   Acquired hypothyroidism 04/30/2019   Panic attack 04/13/2019   Cystic acne 01/01/2019    Past Surgical History:  Procedure Laterality Date   NO PAST SURGERIES     TOOTH EXTRACTION      Family History  Problem Relation Age of Onset   Hypertension Mother    COPD Father    Depression Sister    Depression Sister    Bipolar disorder Maternal Grandmother    Bladder Cancer Maternal Grandfather    Lung cancer Paternal Grandmother    Alcoholism Paternal Grandfather    Cirrhosis Paternal Grandfather    Breast cancer Neg Hx    Ovarian cancer Neg Hx    Colon cancer Neg Hx    Diabetes Neg  Hx     Social History   Socioeconomic History   Marital status: Divorced    Spouse name: Not on file   Number of children: 2   Years of education: 17   Highest education level: Bachelor's degree (e.g., BA, AB, BS)  Occupational History   Occupation: [employer]  Tobacco Use   Smoking status: Former    Packs/day: 0.25    Years: 12.00    Additional pack years: 0.00    Total pack years: 3.00    Types: Cigarettes    Quit date: 06/16/2017    Years since quitting: 5.5   Smokeless tobacco: Never  Vaping Use   Vaping Use: Never used  Substance and Sexual Activity   Alcohol use: Yes    Alcohol/week:  2.0 - 3.0 standard drinks of alcohol    Types: 2 - 3 Cans of beer per week    Comment: once a week   Drug use: No    Comment: last used 03/2017   Sexual activity: Yes    Partners: Male    Birth control/protection: Pill  Other Topics Concern   Not on file  Social History Narrative   Lives with partner and their two sons   Social Determinants of Health   Financial Resource Strain: Low Risk  (01/24/2022)   Overall Financial Resource Strain (CARDIA)    Difficulty of Paying Living Expenses: Not hard at all  Food Insecurity: No Food Insecurity (01/24/2022)   Hunger Vital Sign    Worried About Running Out of Food in the Last Year: Never true    Ran Out of Food in the Last Year: Never true  Transportation Needs: No Transportation Needs (01/24/2022)   PRAPARE - Administrator, Civil Service (Medical): No    Lack of Transportation (Non-Medical): No  Physical Activity: Sufficiently Active (01/24/2022)   Exercise Vital Sign    Days of Exercise per Week: 5 days    Minutes of Exercise per Session: 30 min  Stress: No Stress Concern Present (01/24/2022)   [employer] of Occupational Health - Occupational Stress Questionnaire    Feeling of Stress : Only a little  Social Connections: Moderately Isolated (01/24/2022)   Social Connection and Isolation Panel [NHANES]    Frequency of Communication with Friends and Family: Three times a week    Frequency of Social Gatherings with Friends and Family: Once a week    Attends Religious Services: Never    Database administrator or Organizations: No    Attends Banker Meetings: Never    Marital Status: Living with partner  Intimate Partner Violence: Not At Risk (01/24/2022)   Humiliation, Afraid, Rape, and Kick questionnaire    Fear of Current or Ex-Partner: No    Emotionally Abused: No    Physically Abused: No    Sexually Abused: No     Current Outpatient Medications:    Adapalene (DIFFERIN) 0.3 % gel, Apply a pea sized  amount to the entire face QHS., Disp: 45 g, Rfl: 6   albuterol (PROVENTIL HFA;VENTOLIN HFA) 108 (90 Base) MCG/ACT inhaler, Inhale into the lungs every 6 (six) hours as needed for wheezing or shortness of breath., Disp: , Rfl:    ARIPiprazole (ABILIFY) 2 MG tablet, TAKE 1 TABLET BY MOUTH DAILY., Disp: 90 tablet, Rfl: 1   buPROPion (WELLBUTRIN XL) 150 MG 24 hr tablet, TAKE 1 TABLET BY MOUTH DAILY., Disp: 5 tablet, Rfl: 0   busPIRone (BUSPAR) 10 MG tablet, Take 1  tablet (10 mg total) by mouth 2 (two) times daily as needed., Disp: 10 tablet, Rfl: 0   gentamicin ointment (GARAMYCIN) 0.1 %, apply to nose three times daily (Patient not taking: Reported on 06/06/2022), Disp: 30 g, Rfl: 12   hydrOXYzine (ATARAX) 25 MG tablet, Take 1 tablet (25 mg total) by mouth 3 (three) times daily as needed for anxiety., Disp: 100 tablet, Rfl: 1   levothyroxine (SYNTHROID) 25 MCG tablet, Take 1 tablet (25 mcg total) by mouth daily before breakfast., Disp: 30 tablet, Rfl: 0   Multiple Vitamin (MULTIVITAMIN) tablet, Take 1 tablet by mouth daily., Disp: , Rfl:    spironolactone (ALDACTONE) 50 MG tablet, Take 2 tablets by mouth every morning and 1 tablet at night as directed, Disp: 90 tablet, Rfl: 11   vitamin B-12 (CYANOCOBALAMIN) 500 MCG tablet, Take 500 mcg by mouth daily., Disp: , Rfl:    vitamin C (ASCORBIC ACID) 500 MG tablet, Take 500 mg by mouth daily., Disp: , Rfl:   No Known Allergies  I personally reviewed active problem list, medication list, allergies, family history, social history, health maintenance with the patient/caregiver today.   ROS  Ten systems reviewed and is negative except as mentioned in HPI    Objective  Virtual encounter, vitals not obtained.  There is no height or weight on file to calculate BMI.  Physical Exam  Awake, alert and oriented  PHQ2/9:    05/22/2022    7:43 AM 01/24/2022   10:02 AM 11/15/2021    8:19 AM 09/25/2021   10:53 AM 05/10/2021    2:14 PM  Depression screen PHQ  2/9  Decreased Interest 0 0 0 0 0  Down, Depressed, Hopeless 0 0 0 0 0  PHQ - 2 Score 0 0 0 0 0  Altered sleeping 0 0 0 0 0  Tired, decreased energy 0 0 0 0 0  Change in appetite 0 0 0 0 0  Feeling bad or failure about yourself  0 0 0 0 0  Trouble concentrating 0 0 0 0 0  Moving slowly or fidgety/restless 0 0 0 0 0  Suicidal thoughts 0 0 0 0 0  PHQ-9 Score 0 0 0 0 0  Difficult doing work/chores    Not difficult at all Not difficult at all   PHQ-2/9 Result is negative.    Fall Risk:    05/22/2022    7:43 AM 01/24/2022   10:02 AM 11/15/2021    8:19 AM 09/25/2021   10:53 AM 04/19/2021    1:20 PM  Fall Risk   Falls in the past year? 0 0 0 0 0  Number falls in past yr: 0 0 0 0 0  Injury with Fall? 0 0 0 0 0  Risk for fall due to : No Fall Risks No Fall Risks No Fall Risks No Fall Risks   Follow up Falls prevention discussed Falls prevention discussed Falls prevention discussed Falls prevention discussed Falls evaluation completed     Assessment & Plan  1. Acquired hypothyroidism  - levothyroxine (SYNTHROID) 25 MCG tablet; Take 1 tablet (25 mcg total) by mouth daily before breakfast.  Dispense: 90 tablet; Refill: 0 - TSH  2. Panic attack  - busPIRone (BUSPAR) 10 MG tablet; Take 1 tablet (10 mg total) by mouth 2 (two) times daily as needed.  Dispense: 180 tablet; Refill: 0 - buPROPion (WELLBUTRIN XL) 150 MG 24 hr tablet; TAKE 1 TABLET BY MOUTH DAILY.  Dispense: 90 tablet; Refill: 0  3.  Bipolar disorder with depression  - busPIRone (BUSPAR) 10 MG tablet; Take 1 tablet (10 mg total) by mouth 2 (two) times daily as needed.  Dispense: 180 tablet; Refill: 0 - buPROPion (WELLBUTRIN XL) 150 MG 24 hr tablet; TAKE 1 TABLET BY MOUTH DAILY.  Dispense: 90 tablet; Refill: 0  4. History of iron deficiency anemia  - CBC with Differential/Platelet - Iron, TIBC and Ferritin Panel  5. Acne vulgaris   6. Exercise-induced asthma  - albuterol (VENTOLIN HFA) 108 (90 Base) MCG/ACT inhaler;  Inhale 2 puffs into the lungs every 6 (six) hours as needed for wheezing or shortness of breath.  Dispense: 18 g; Refill: 0  7. Long-term use of high-risk medication  - COMPLETE METABOLIC PANEL WITH GFR - CBC with Differential/Platelet  8. Dyslipidemia  - Lipid panel  9. B12 deficiency  - B12 and Folate Panel   I discussed the assessment and treatment plan with the patient. The patient was provided an opportunity to ask questions and all were answered. The patient agreed with the plan and demonstrated an understanding of the instructions.  The patient was advised to call back or seek an in-person evaluation if the symptoms worsen or if the condition fails to improve as anticipated.  I provided 25  minutes of non-face-to-face time during this encounter.

## 2022-12-30 ENCOUNTER — Telehealth (INDEPENDENT_AMBULATORY_CARE_PROVIDER_SITE_OTHER): Payer: 59 | Admitting: Family Medicine

## 2022-12-30 ENCOUNTER — Other Ambulatory Visit: Payer: Self-pay

## 2022-12-30 ENCOUNTER — Encounter: Payer: Self-pay | Admitting: Family Medicine

## 2022-12-30 VITALS — Ht 69.0 in | Wt 171.0 lb

## 2022-12-30 DIAGNOSIS — F319 Bipolar disorder, unspecified: Secondary | ICD-10-CM | POA: Diagnosis not present

## 2022-12-30 DIAGNOSIS — E039 Hypothyroidism, unspecified: Secondary | ICD-10-CM

## 2022-12-30 DIAGNOSIS — E785 Hyperlipidemia, unspecified: Secondary | ICD-10-CM

## 2022-12-30 DIAGNOSIS — L700 Acne vulgaris: Secondary | ICD-10-CM

## 2022-12-30 DIAGNOSIS — Z79899 Other long term (current) drug therapy: Secondary | ICD-10-CM | POA: Diagnosis not present

## 2022-12-30 DIAGNOSIS — E538 Deficiency of other specified B group vitamins: Secondary | ICD-10-CM | POA: Diagnosis not present

## 2022-12-30 DIAGNOSIS — F410 Panic disorder [episodic paroxysmal anxiety] without agoraphobia: Secondary | ICD-10-CM | POA: Diagnosis not present

## 2022-12-30 DIAGNOSIS — J45990 Exercise induced bronchospasm: Secondary | ICD-10-CM | POA: Diagnosis not present

## 2022-12-30 DIAGNOSIS — Z862 Personal history of diseases of the blood and blood-forming organs and certain disorders involving the immune mechanism: Secondary | ICD-10-CM

## 2022-12-30 MED ORDER — BUSPIRONE HCL 10 MG PO TABS
10.0000 mg | ORAL_TABLET | Freq: Two times a day (BID) | ORAL | 0 refills | Status: DC | PRN
Start: 2022-12-30 — End: 2023-04-28
  Filled 2022-12-30: qty 180, 90d supply, fill #0

## 2022-12-30 MED ORDER — ALBUTEROL SULFATE HFA 108 (90 BASE) MCG/ACT IN AERS
2.0000 | INHALATION_SPRAY | Freq: Four times a day (QID) | RESPIRATORY_TRACT | 0 refills | Status: DC | PRN
Start: 2022-12-30 — End: 2024-07-22
  Filled 2022-12-30: qty 6.7, 30d supply, fill #0

## 2022-12-30 MED ORDER — BUPROPION HCL ER (XL) 150 MG PO TB24
150.0000 mg | ORAL_TABLET | Freq: Every day | ORAL | 0 refills | Status: DC
Start: 2022-12-30 — End: 2023-04-28
  Filled 2022-12-30: qty 90, 90d supply, fill #0

## 2022-12-30 MED ORDER — LEVOTHYROXINE SODIUM 25 MCG PO TABS
25.0000 ug | ORAL_TABLET | Freq: Every day | ORAL | 0 refills | Status: DC
  Filled 2022-12-30 – 2023-02-03 (×2): qty 90, 90d supply, fill #0

## 2022-12-31 ENCOUNTER — Other Ambulatory Visit: Payer: Self-pay

## 2023-02-03 ENCOUNTER — Other Ambulatory Visit: Payer: Self-pay

## 2023-02-04 ENCOUNTER — Telehealth: Payer: Self-pay

## 2023-02-04 NOTE — Telephone Encounter (Signed)
Added at end of day tomorrow, JS

## 2023-02-04 NOTE — Telephone Encounter (Signed)
Patient left nurse VM asking for a cystic acne papule to be injected. We have worked patient in before. Do we want to see her/is this urgent?

## 2023-02-04 NOTE — Telephone Encounter (Signed)
Ok to add her to schedule tomorrow. Thank you!

## 2023-02-05 ENCOUNTER — Ambulatory Visit: Payer: 59 | Admitting: Dermatology

## 2023-02-05 ENCOUNTER — Other Ambulatory Visit: Payer: Self-pay

## 2023-02-05 VITALS — BP 102/68 | HR 78

## 2023-02-05 DIAGNOSIS — L720 Epidermal cyst: Secondary | ICD-10-CM

## 2023-02-05 DIAGNOSIS — L700 Acne vulgaris: Secondary | ICD-10-CM | POA: Diagnosis not present

## 2023-02-05 MED ORDER — DOXYCYCLINE MONOHYDRATE 100 MG PO CAPS
100.0000 mg | ORAL_CAPSULE | Freq: Two times a day (BID) | ORAL | 0 refills | Status: DC
  Filled 2023-02-05: qty 14, 7d supply, fill #0

## 2023-02-05 MED ORDER — DOXYCYCLINE HYCLATE 20 MG PO TABS
20.0000 mg | ORAL_TABLET | Freq: Two times a day (BID) | ORAL | 3 refills | Status: DC
Start: 2023-02-05 — End: 2024-01-20
  Filled 2023-02-05 – 2023-02-06 (×2): qty 60, 30d supply, fill #0

## 2023-02-05 NOTE — Patient Instructions (Signed)
Due to recent changes in healthcare laws, you may see results of your pathology and/or laboratory studies on MyChart before the doctors have had a chance to review them. We understand that in some cases there may be results that are confusing or concerning to you. Please understand that not all results are received at the same time and often the doctors may need to interpret multiple results in order to provide you with the best plan of care or course of treatment. Therefore, we ask that you please give us 2 business days to thoroughly review all your results before contacting the office for clarification. Should we see a critical lab result, you will be contacted sooner.   If You Need Anything After Your Visit  If you have any questions or concerns for your doctor, please call our main line at [phone] and press option 4 to reach your doctor's medical assistant. If no one answers, please leave a voicemail as directed and we will return your call as soon as possible. Messages left after 4 pm will be answered the following business day.   You may also send us a message via MyChart. We typically respond to MyChart messages within 1-2 business days.  For prescription refills, please ask your pharmacy to contact our office. Our fax number is [phone].  If you have an urgent issue when the clinic is closed that cannot wait until the next business day, you can page your doctor at the number below.    Please note that while we do our best to be available for urgent issues outside of office hours, we are not available 24/7.   If you have an urgent issue and are unable to reach us, you may choose to seek medical care at your doctor's office, retail clinic, urgent care center, or emergency room.  If you have a medical emergency, please immediately call 911 or go to the emergency department.  Pager Numbers  - Dr. Kowalski: [phone]  - Dr. Moye: [phone]  - Dr. Stewart:  [phone]  In the event of inclement weather, please call our main line at [phone] for an update on the status of any delays or closures.  Dermatology Medication Tips: Please keep the boxes that topical medications come in in order to help keep track of the instructions about where and how to use these. Pharmacies typically print the medication instructions only on the boxes and not directly on the medication tubes.   If your medication is too expensive, please contact our office at [phone] option 4 or send us a message through MyChart.   We are unable to tell what your co-pay for medications will be in advance as this is different depending on your insurance coverage. However, we may be able to find a substitute medication at lower cost or fill out paperwork to get insurance to cover a needed medication.   If a prior authorization is required to get your medication covered by your insurance company, please allow us 1-2 business days to complete this process.  Drug prices often vary depending on where the prescription is filled and some pharmacies may offer cheaper prices.  The website www.goodrx.com contains coupons for medications through different pharmacies. The prices here do not account for what the cost may be with help from insurance (it may be cheaper with your insurance), but the website can give you the price if you did not use any insurance.  - You can print the associated coupon and take it with   your prescription to the pharmacy.  - You may also stop by our office during regular business hours and pick up a GoodRx coupon card.  - If you need your prescription sent electronically to a different pharmacy, notify our office through [facility] MyChart or by phone at [phone] option 4.     Si Usted Necesita Algo Despus de Su Visita  Tambin puede enviarnos un mensaje a travs de MyChart. Por lo general respondemos a los mensajes de MyChart en el transcurso de 1 a 2  das hbiles.  Para renovar recetas, por favor pida a su farmacia que se ponga en contacto con nuestra oficina. Nuestro nmero de fax es el [phone].  Si tiene un asunto urgente cuando la clnica est cerrada y que no puede esperar hasta el siguiente da hbil, puede llamar/localizar a su doctor(a) al nmero que aparece a continuacin.   Por favor, tenga en cuenta que aunque hacemos todo lo posible para estar disponibles para asuntos urgentes fuera del horario de oficina, no estamos disponibles las 24 horas del da, los 7 das de la semana.   Si tiene un problema urgente y no puede comunicarse con nosotros, puede optar por buscar atencin mdica  en el consultorio de su doctor(a), en una clnica privada, en un centro de atencin urgente o en una sala de emergencias.  Si tiene una emergencia mdica, por favor llame inmediatamente al 911 o vaya a la sala de emergencias.  Nmeros de bper  - Dr. Kowalski: [phone]  - Dra. Moye: [phone]  - Dra. Stewart: [phone]  En caso de inclemencias del tiempo, por favor llame a nuestra lnea principal al [phone] para una actualizacin sobre el estado de cualquier retraso o cierre.  Consejos para la medicacin en dermatologa: Por favor, guarde las cajas en las que vienen los medicamentos de uso tpico para ayudarle a seguir las instrucciones sobre dnde y cmo usarlos. Las farmacias generalmente imprimen las instrucciones del medicamento slo en las cajas y no directamente en los tubos del medicamento.   Si su medicamento es muy caro, por favor, pngase en contacto con nuestra oficina llamando al [phone] y presione la opcin 4 o envenos un mensaje a travs de MyChart.   No podemos decirle cul ser su copago por los medicamentos por adelantado ya que esto es diferente dependiendo de la cobertura de su seguro. Sin embargo, es posible que podamos encontrar un medicamento sustituto a menor costo o llenar un formulario para que el  seguro cubra el medicamento que se considera necesario.   Si se requiere una autorizacin previa para que su compaa de seguros cubra su medicamento, por favor permtanos de 1 a 2 das hbiles para completar este proceso.  Los precios de los medicamentos varan con frecuencia dependiendo del lugar de dnde se surte la receta y alguna farmacias pueden ofrecer precios ms baratos.  El sitio web www.goodrx.com tiene cupones para medicamentos de diferentes farmacias. Los precios aqu no tienen en cuenta lo que podra costar con la ayuda del seguro (puede ser ms barato con su seguro), pero el sitio web puede darle el precio si no utiliz ningn seguro.  - Puede imprimir el cupn correspondiente y llevarlo con su receta a la farmacia.  - Tambin puede pasar por nuestra oficina durante el horario de atencin regular y recoger una tarjeta de cupones de GoodRx.  - Si necesita que su receta se enve electrnicamente a una farmacia diferente, informe a nuestra oficina a travs de MyChart de [place]   o por telfono llamando al [phone] y presione la opcin 4.  

## 2023-02-05 NOTE — Progress Notes (Signed)
Follow-Up Visit   Subjective  Jean Foster is a 37 y.o. female who presents for the following: Acne Vulgaris - currently using Spironolactone 100 mg po QD uses 150 mg po QD during times of stress and adapalene QHS, but due to stress from upcoming wedding acne has started to flare, and she would like injections today.  The following portions of the chart were reviewed this encounter and updated as appropriate: medications, allergies, medical history  Review of Systems:  No other skin or systemic complaints except as noted in HPI or Assessment and Plan.  Objective  Well appearing patient in no apparent distress; mood and affect are within normal limits.  Areas Examined: Face, chest and back  Relevant exam findings are noted in the Assessment and Plan. L chin Rare open comedone, rare tiny inflammatory papule, cyst left chin  Head - Anterior (Face) Erythematous tender papule    Assessment & Plan    Acne vulgaris L chin  Chronic and persistent condition with duration or expected duration over one year. Condition is bothersome/symptomatic for patient. Currently flared.  Patient's wedding is within a month  Treatment Plan:  Take doxycycline 20 mg tablet by mouth twice a day. #60, 3 refills  Doxycycline should be taken with food to prevent nausea. Do not lay down for 30 minutes after taking. Be cautious with sun exposure and use good sun protection while on this medication. Pregnant women should not take this medication.    If still flaring a week before the wedding take doxycycline 100 mg tablet by mouth twice a day for 7 days. #14, 0 refills  Doxycycline should be taken with food to prevent nausea. Do not lay down for 30 minutes after taking. Be cautious with sun exposure and use good sun protection while on this medication. Pregnant women should not take this medication.  Continue Adapalene 0.3% gel QHS. Topical retinoid medications like tretinoin/Retin-A,  adapalene/Differin, tazarotene/Fabior, and Epiduo/Epiduo Forte can cause dryness and irritation when first started. Only apply a pea-sized amount to the entire affected area. Avoid applying it around the eyes, edges of mouth and creases at the nose. If you experience irritation, use a good moisturizer first and/or apply the medicine less often. If you are doing well with the medicine, you can increase how often you use it until you are applying every night. Be careful with sun protection while using this medication as it can make you sensitive to the sun. This medicine should not be used by pregnant women.   Continue Spironolactone may increase to 150 mg po QD, pt has tolerated medication well.   Intralesional injection - L chin  doxycycline (PERIOSTAT) 20 MG tablet - L chin Take 1 tablet (20 mg total) by mouth 2 (two) times daily. Take with food.  Related Medications spironolactone (ALDACTONE) 50 MG tablet Take 2 tablets by mouth every morning and 1 tablet at night as directed  Adapalene (DIFFERIN) 0.3 % gel Apply a pea sized amount to the entire face QHS.  Epidermal cyst Head - Anterior (Face)  Patient desires ILK injection today to calm this faster  Intralesional injection - Head - Anterior (Face) Location: L chin  Informed Consent: Discussed risks (infection, pain, bleeding, bruising, thinning of the skin, loss of skin pigment, lack of resolution, and recurrence of lesion) and benefits of the procedure, as well as the alternatives. Informed consent was obtained. Preparation: The area was prepared a standard fashion.  Procedure Details: An intralesional injection was performed with Kenalog 1.25  mg/cc. 0.5 cc in total were injected.  Total number of injections: 1  Plan: The patient was instructed on post-op care. Recommend OTC analgesia as needed for pain.  [facility] [phone]     Return for appointment as scheduled.  Maylene Roes, CMA, am acting as scribe for Darden Dates,  MD .  Documentation: I have reviewed the above documentation for accuracy and completeness, and I agree with the above.  Darden Dates, MD

## 2023-02-06 ENCOUNTER — Other Ambulatory Visit: Payer: Self-pay

## 2023-03-04 ENCOUNTER — Encounter: Payer: Self-pay | Admitting: Dermatology

## 2023-03-04 ENCOUNTER — Ambulatory Visit: Payer: 59 | Admitting: Dermatology

## 2023-03-04 VITALS — BP 98/63 | HR 72

## 2023-03-04 DIAGNOSIS — L700 Acne vulgaris: Secondary | ICD-10-CM | POA: Diagnosis not present

## 2023-03-04 DIAGNOSIS — Z79899 Other long term (current) drug therapy: Secondary | ICD-10-CM

## 2023-03-04 MED ORDER — TRIAMCINOLONE ACETONIDE 10 MG/ML IJ SUSP
1.2500 mg | Freq: Once | INTRAMUSCULAR | Status: AC
Start: 2023-03-04 — End: 2023-03-04
  Administered 2023-03-04: 1.3 mg

## 2023-03-04 NOTE — Progress Notes (Signed)
Follow-Up Visit   Subjective  Jean Foster is a 37 y.o. female who presents for the following: 1 month f/u on acne on her face, patient would like a ILK injection to inflamed bump on the left chin. Patient taking Doxycycline 20 mg 2 tablets daily, Spironolactone 50 mg 2 tablets daily and using Adapalene gel at bedtime  The following portions of the chart were reviewed this encounter and updated as appropriate: medications, allergies, medical history  Review of Systems:  No other skin or systemic complaints except as noted in HPI or Assessment and Plan.  Objective  Well appearing patient in no apparent distress; mood and affect are within normal limits. A focused examination was performed of the following areas:face Relevant exam findings are noted in the Assessment and Plan.  left chin Erythematous inflamed papule    Assessment & Plan   ACNE VULGARIS Exam: inflamed pink papule at the left chin   Chronic and persistent condition with duration or expected duration over one year. Condition is symptomatic/ bothersome to patient. Not currently at goal.   Treatment Plan: Continue  doxycycline 20 mg tablet by mouth twice a day.  Doxycycline should be taken with food to prevent nausea. Do not lay down for 30 minutes after taking. Be cautious with sun exposure and use good sun protection while on this medication. Pregnant women should not take this medication.     Continue Adapalene 0.3% gel QHS. Topical retinoid medications like tretinoin/Retin-A, adapalene/Differin, tazarotene/Fabior, and Epiduo/Epiduo Forte can cause dryness and irritation when first started. Only apply a pea-sized amount to the entire affected area. Avoid applying it around the eyes, edges of mouth and creases at the nose. If you experience irritation, use a good moisturizer first and/or apply the medicine less often. If you are doing well with the medicine, you can increase how often you use it until you are applying  every night. Be careful with sun protection while using this medication as it can make you sensitive to the sun. This medicine should not be used by pregnant women.    Continue Spironolactone may increase to 150 mg po QD, pt has tolerated medication well.     Long term medication management.  Patient is using long term (months to years) prescription medication  to control their dermatologic condition.  These medications require periodic monitoring to evaluate for efficacy and side effects and may require periodic laboratory monitoring.  Patient has seen Dr. Neale Burly recently.  The patient is getting married in a few days.  We will continue her current medication regimen for acne at least for now until after her wedding.  We may consider adjusting things in the future.  Cystic acne left chin  Intralesional injection - left chin Location: left chin  Informed Consent: Discussed risks (infection, pain, bleeding, bruising, thinning of the skin, loss of skin pigment, lack of resolution, and recurrence of lesion) and benefits of the procedure, as well as the alternatives. Informed consent was obtained. Preparation: The area was prepared a standard fashion.  Anesthesia:none   Procedure Details: An intralesional injection was performed with Kenalog 1.25 mg/cc. 0.1 cc in total were injected.  NDC 1610-9604-54 Total number of injections: 1  Plan: The patient was instructed on post-op care. Recommend OTC analgesia as needed for pain.   Related Medications triamcinolone acetonide (KENALOG) 10 MG/ML injection 1.3 mg   Return if symptoms worsen or fail to improve.  IAngelique Holm, CMA, am acting as scribe for Armida Sans, MD .  Documentation: I have reviewed the above documentation for accuracy and completeness, and I agree with the above.  Sarina Ser, MD

## 2023-03-04 NOTE — Patient Instructions (Signed)
Due to recent changes in healthcare laws, you may see results of your pathology and/or laboratory studies on MyChart before the doctors have had a chance to review them. We understand that in some cases there may be results that are confusing or concerning to you. Please understand that not all results are received at the same time and often the doctors may need to interpret multiple results in order to provide you with the best plan of care or course of treatment. Therefore, we ask that you please give us 2 business days to thoroughly review all your results before contacting the office for clarification. Should we see a critical lab result, you will be contacted sooner.   If You Need Anything After Your Visit  If you have any questions or concerns for your doctor, please call our main line at [phone] and press option 4 to reach your doctor's medical assistant. If no one answers, please leave a voicemail as directed and we will return your call as soon as possible. Messages left after 4 pm will be answered the following business day.   You may also send us a message via MyChart. We typically respond to MyChart messages within 1-2 business days.  For prescription refills, please ask your pharmacy to contact our office. Our fax number is [phone].  If you have an urgent issue when the clinic is closed that cannot wait until the next business day, you can page your doctor at the number below.    Please note that while we do our best to be available for urgent issues outside of office hours, we are not available 24/7.   If you have an urgent issue and are unable to reach us, you may choose to seek medical care at your doctor's office, retail clinic, urgent care center, or emergency room.  If you have a medical emergency, please immediately call 911 or go to the emergency department.  Pager Numbers  - Dr. Kowalski: [phone]  - Dr. Moye: [phone]  - Dr. Stewart:  [phone]  In the event of inclement weather, please call our main line at [phone] for an update on the status of any delays or closures.  Dermatology Medication Tips: Please keep the boxes that topical medications come in in order to help keep track of the instructions about where and how to use these. Pharmacies typically print the medication instructions only on the boxes and not directly on the medication tubes.   If your medication is too expensive, please contact our office at [phone] option 4 or send us a message through MyChart.   We are unable to tell what your co-pay for medications will be in advance as this is different depending on your insurance coverage. However, we may be able to find a substitute medication at lower cost or fill out paperwork to get insurance to cover a needed medication.   If a prior authorization is required to get your medication covered by your insurance company, please allow us 1-2 business days to complete this process.  Drug prices often vary depending on where the prescription is filled and some pharmacies may offer cheaper prices.  The website www.goodrx.com contains coupons for medications through different pharmacies. The prices here do not account for what the cost may be with help from insurance (it may be cheaper with your insurance), but the website can give you the price if you did not use any insurance.  - You can print the associated coupon and take it with   your prescription to the pharmacy.  - You may also stop by our office during regular business hours and pick up a GoodRx coupon card.  - If you need your prescription sent electronically to a different pharmacy, notify our office through [place] MyChart or by phone at [phone] option 4.     Si Usted Necesita Algo Despus de Su Visita  Tambin puede enviarnos un mensaje a travs de MyChart. Por lo general respondemos a los mensajes de MyChart en el transcurso de 1 a 2  das hbiles.  Para renovar recetas, por favor pida a su farmacia que se ponga en contacto con nuestra oficina. Nuestro nmero de fax es el [phone].  Si tiene un asunto urgente cuando la clnica est cerrada y que no puede esperar hasta el siguiente da hbil, puede llamar/localizar a su doctor(a) al nmero que aparece a continuacin.   Por favor, tenga en cuenta que aunque hacemos todo lo posible para estar disponibles para asuntos urgentes fuera del horario de oficina, no estamos disponibles las 24 horas del da, los 7 das de la semana.   Si tiene un problema urgente y no puede comunicarse con nosotros, puede optar por buscar atencin mdica  en el consultorio de su doctor(a), en una clnica privada, en un centro de atencin urgente o en una sala de emergencias.  Si tiene una emergencia mdica, por favor llame inmediatamente al 911 o vaya a la sala de emergencias.  Nmeros de bper  - Dr. Kowalski: [phone]  - Dra. Moye: [phone]  - Dra. Stewart: [phone]  En caso de inclemencias del tiempo, por favor llame a nuestra lnea principal al [phone] para una actualizacin sobre el estado de cualquier retraso o cierre.  Consejos para la medicacin en dermatologa: Por favor, guarde las cajas en las que vienen los medicamentos de uso tpico para ayudarle a seguir las instrucciones sobre dnde y cmo usarlos. Las farmacias generalmente imprimen las instrucciones del medicamento slo en las cajas y no directamente en los tubos del medicamento.   Si su medicamento es muy caro, por favor, pngase en contacto con nuestra oficina llamando al [phone] y presione la opcin 4 o envenos un mensaje a travs de MyChart.   No podemos decirle cul ser su copago por los medicamentos por adelantado ya que esto es diferente dependiendo de la cobertura de su seguro. Sin embargo, es posible que podamos encontrar un medicamento sustituto a menor costo o llenar un formulario para que el  seguro cubra el medicamento que se considera necesario.   Si se requiere una autorizacin previa para que su compaa de seguros cubra su medicamento, por favor permtanos de 1 a 2 das hbiles para completar este proceso.  Los precios de los medicamentos varan con frecuencia dependiendo del lugar de dnde se surte la receta y alguna farmacias pueden ofrecer precios ms baratos.  El sitio web www.goodrx.com tiene cupones para medicamentos de diferentes farmacias. Los precios aqu no tienen en cuenta lo que podra costar con la ayuda del seguro (puede ser ms barato con su seguro), pero el sitio web puede darle el precio si no utiliz ningn seguro.  - Puede imprimir el cupn correspondiente y llevarlo con su receta a la farmacia.  - Tambin puede pasar por nuestra oficina durante el horario de atencin regular y recoger una tarjeta de cupones de GoodRx.  - Si necesita que su receta se enve electrnicamente a una farmacia diferente, informe a nuestra oficina a travs de MyChart de [place]   o por telfono llamando al [phone] y presione la opcin 4.  

## 2023-04-14 IMAGING — CR DG CHEST 2V
3 series · 3 of 3 positions shown · non-contrast
Comparison: 06/23/2017

CLINICAL DATA: Left-sided chest pain

EXAM:
CHEST - 2 VIEW

[chest lat]
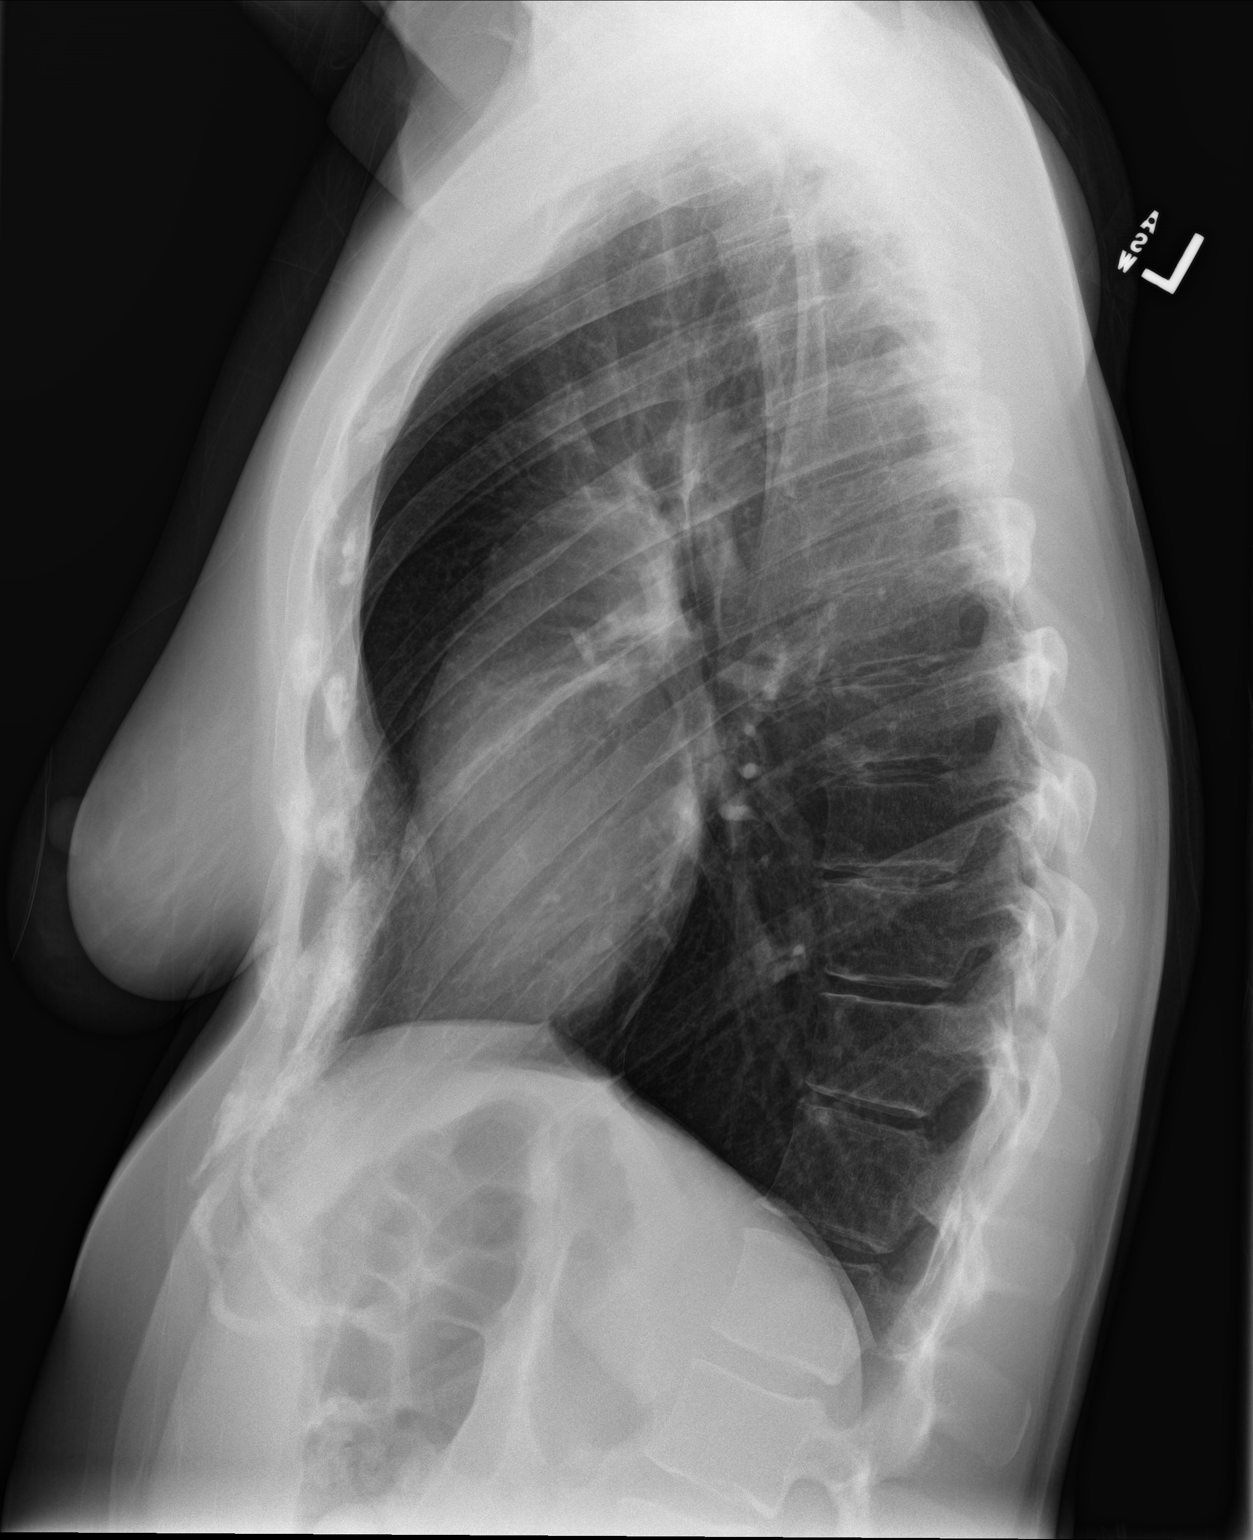

[chest pa (1 of 2)]
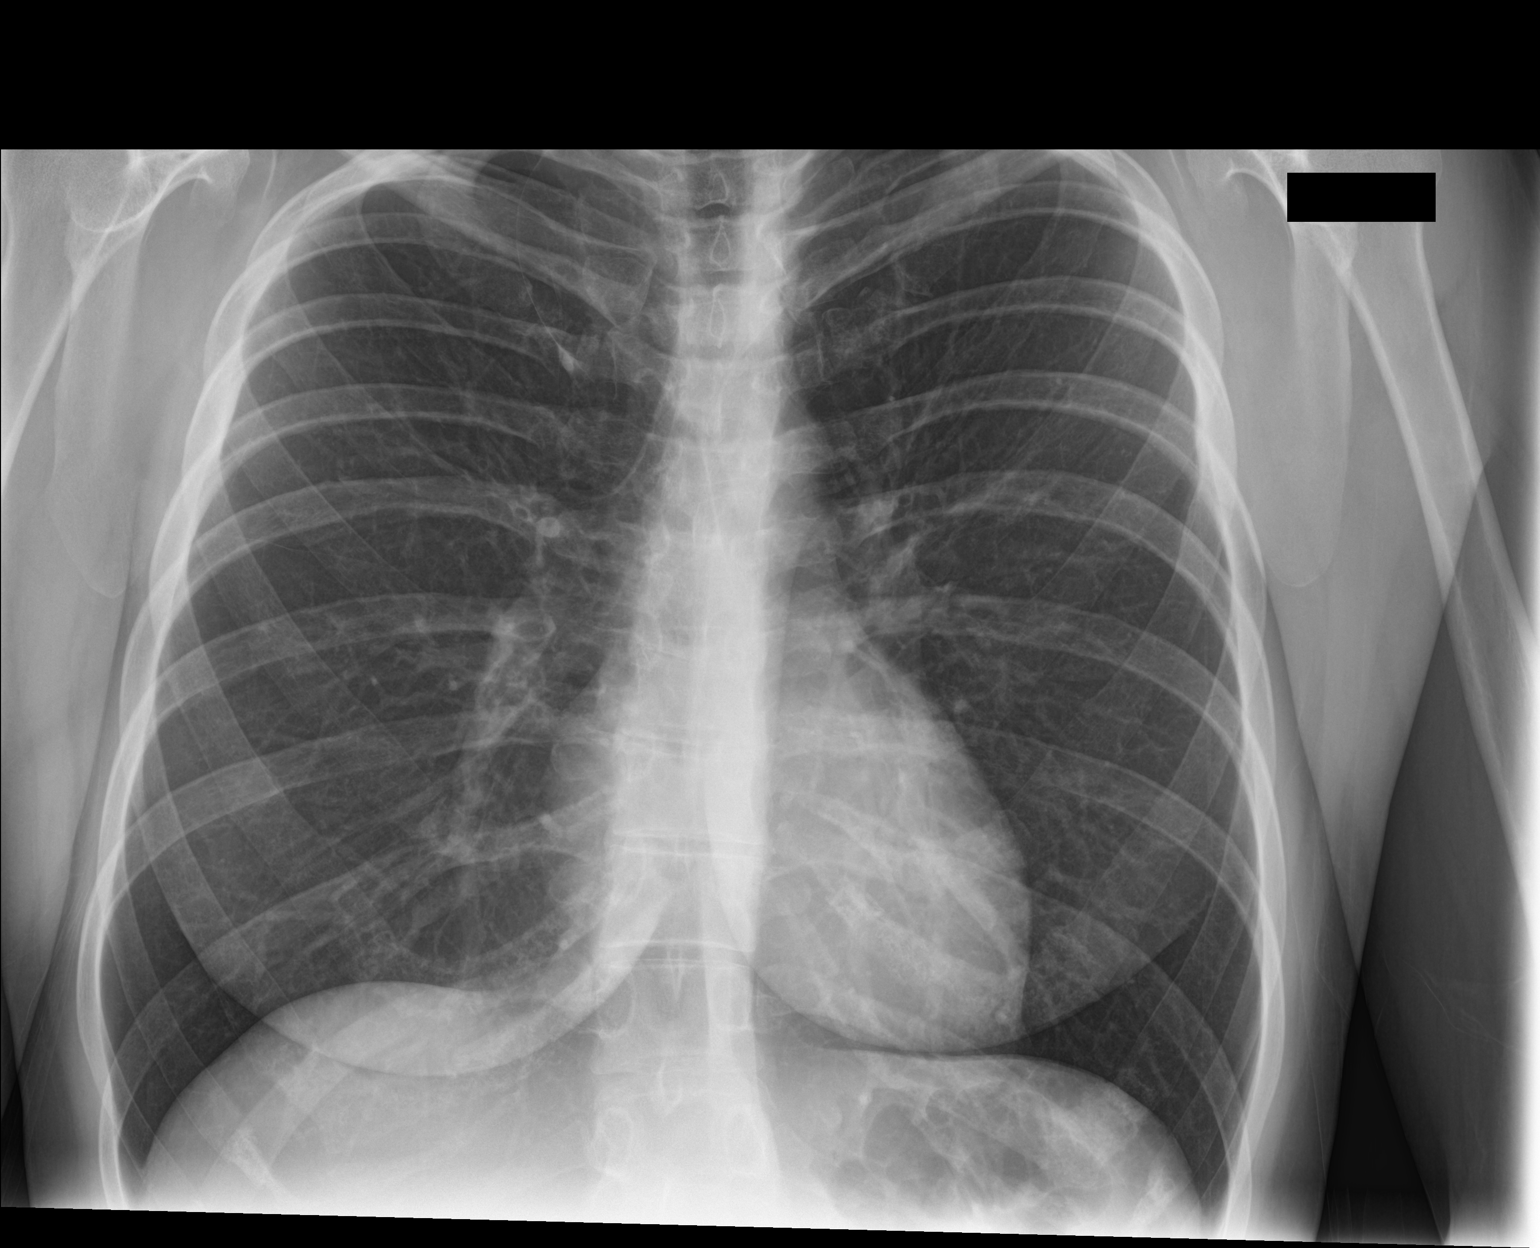

[chest pa (2 of 2)]
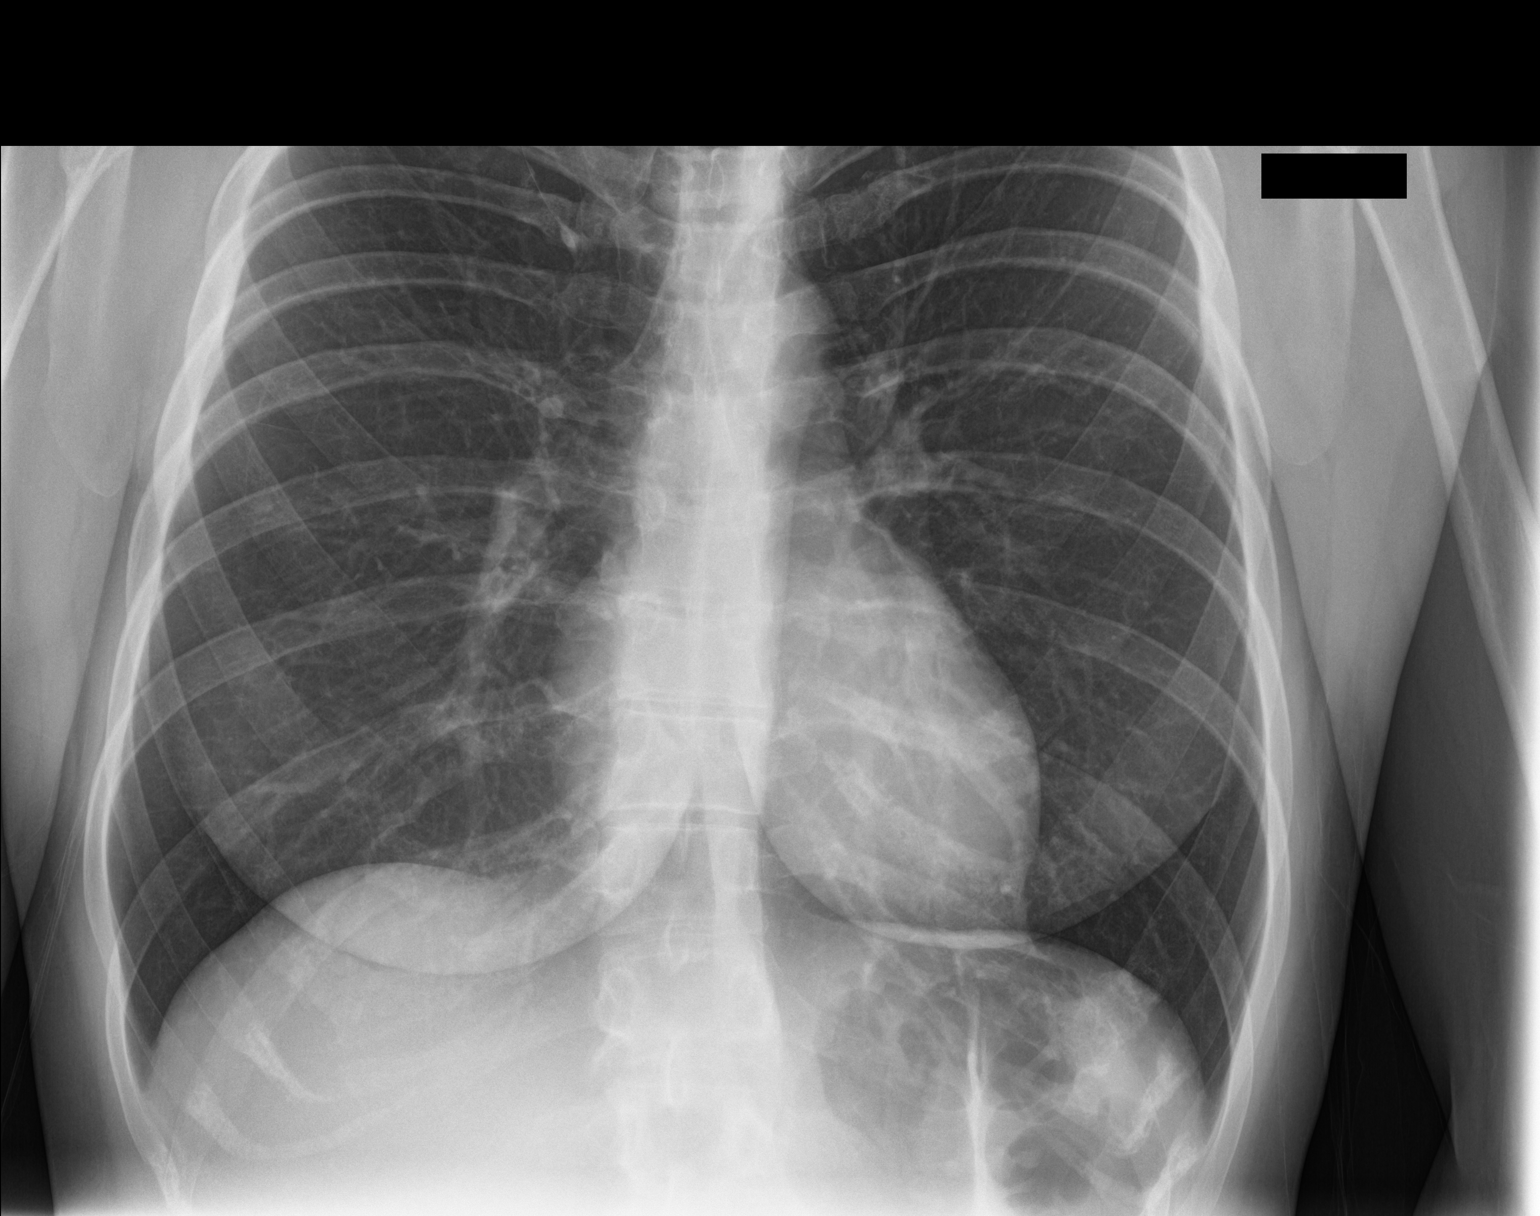

[3 of 3 positions shown; findings below may reference images not displayed]

FINDINGS: The heart size and mediastinal contours are within normal limits.
Both lungs are clear. The visualized skeletal structures are
unremarkable.
IMPRESSION: No active cardiopulmonary disease.

## 2023-04-16 ENCOUNTER — Other Ambulatory Visit: Payer: Self-pay | Admitting: Family Medicine

## 2023-04-16 DIAGNOSIS — F319 Bipolar disorder, unspecified: Secondary | ICD-10-CM

## 2023-04-16 DIAGNOSIS — F410 Panic disorder [episodic paroxysmal anxiety] without agoraphobia: Secondary | ICD-10-CM

## 2023-04-17 ENCOUNTER — Other Ambulatory Visit: Payer: Self-pay | Admitting: Family Medicine

## 2023-04-17 ENCOUNTER — Other Ambulatory Visit: Payer: Self-pay

## 2023-04-17 DIAGNOSIS — F410 Panic disorder [episodic paroxysmal anxiety] without agoraphobia: Secondary | ICD-10-CM

## 2023-04-17 DIAGNOSIS — F319 Bipolar disorder, unspecified: Secondary | ICD-10-CM

## 2023-04-18 ENCOUNTER — Other Ambulatory Visit: Payer: Self-pay

## 2023-04-20 ENCOUNTER — Other Ambulatory Visit: Payer: Self-pay

## 2023-04-25 ENCOUNTER — Other Ambulatory Visit: Payer: Self-pay

## 2023-04-25 ENCOUNTER — Other Ambulatory Visit: Payer: Self-pay | Admitting: Family Medicine

## 2023-04-25 DIAGNOSIS — F319 Bipolar disorder, unspecified: Secondary | ICD-10-CM

## 2023-04-25 DIAGNOSIS — F410 Panic disorder [episodic paroxysmal anxiety] without agoraphobia: Secondary | ICD-10-CM

## 2023-04-28 ENCOUNTER — Other Ambulatory Visit: Payer: Self-pay | Admitting: Family Medicine

## 2023-04-28 ENCOUNTER — Other Ambulatory Visit: Payer: Self-pay

## 2023-04-28 DIAGNOSIS — F410 Panic disorder [episodic paroxysmal anxiety] without agoraphobia: Secondary | ICD-10-CM

## 2023-04-28 DIAGNOSIS — F319 Bipolar disorder, unspecified: Secondary | ICD-10-CM

## 2023-04-28 NOTE — Telephone Encounter (Unsigned)
Copied from CRM [id number]. Topic: General - Other >> Apr 28, 2023  1:45 PM Everette C wrote: Reason for CRM: Medication Refill - Medication: Rx #: [id number]  buPROPion (WELLBUTRIN XL) 150 MG 24 hr tablet [[id number]]    Rx #: [id number]  busPIRone (BUSPAR) 10 MG tablet [[id number]]    Has the patient contacted their pharmacy? Yes.   (Agent: If no, request that the patient contact the pharmacy for the refill. If patient does not wish to contact the pharmacy document the reason why and proceed with request.) (Agent: If yes, when and what did the pharmacy advise?)  Preferred Pharmacy (with phone number or street name): [facility] [address] [place] [address] Phone: [phone] Fax: [phone] Hours: M-F 7:30a-6p   Has the patient been seen for an appointment in the last year OR does the patient have an upcoming appointment? Yes.    Agent: Please be advised that RX refills may take up to 3 business days. We ask that you follow-up with your pharmacy.

## 2023-04-30 ENCOUNTER — Other Ambulatory Visit: Payer: Self-pay

## 2023-04-30 MED ORDER — BUPROPION HCL ER (XL) 150 MG PO TB24
150.0000 mg | ORAL_TABLET | Freq: Every day | ORAL | 0 refills | Status: DC
Start: 2023-04-30 — End: 2023-05-29
  Filled 2023-04-30: qty 30, 30d supply, fill #0

## 2023-04-30 MED ORDER — BUSPIRONE HCL 10 MG PO TABS
10.0000 mg | ORAL_TABLET | Freq: Two times a day (BID) | ORAL | 0 refills | Status: DC | PRN
Start: 2023-04-30 — End: 2023-06-11
  Filled 2023-04-30: qty 60, 30d supply, fill #0

## 2023-04-30 NOTE — Telephone Encounter (Signed)
Requested medication (s) are due for refill today: yes  Requested medication (s) are on the active medication list: yes  Last refill:  12/30/22  Future visit scheduled: yes  Notes to clinic:  Unable to refill per protocol, appointment needed in office. Patient has OV scheduled 05/07/23, routing for approval.     Requested Prescriptions  Pending Prescriptions Disp Refills   buPROPion (WELLBUTRIN XL) 150 MG 24 hr tablet 90 tablet 0    Sig: Take 1 tablet (150 mg total) by mouth daily.     Psychiatry: Antidepressants - bupropion Failed - 04/28/2023  1:57 PM      Failed - Cr in normal range and within 360 days    Creat  Date Value Ref Range Status  01/24/2022 0.86 0.50 - 0.97 mg/dL Final   Creatinine, Ser  Date Value Ref Range Status  03/24/2022 0.89 0.44 - 1.00 mg/dL Final         Failed - AST in normal range and within 360 days    AST  Date Value Ref Range Status  01/24/2022 13 10 - 30 U/L Final         Failed - ALT in normal range and within 360 days    ALT  Date Value Ref Range Status  01/24/2022 9 6 - 29 U/L Final         Passed - Last BP in normal range    BP Readings from Last 1 Encounters:  03/04/23 98/63         Passed - Valid encounter within last 6 months    Recent Outpatient Visits           4 months ago Acquired hypothyroidism   [facility] Alba Cory, MD   11 months ago Iron deficiency anemia due to chronic blood loss   [facility] Alba Cory, MD   1 year ago Well adult exam   [facility] Alba Cory, MD   1 year ago Dyslipidemia   [facility] Alba Cory, MD   1 year ago Ulcer of nose (septum)   [facility] Gabriel Cirri, NP       Future Appointments             In 1 week Berniece Salines, FNP [facility]             busPIRone (BUSPAR) 10 MG tablet 180  tablet 0    Sig: Take 1 tablet (10 mg total) by mouth 2 (two) times daily as needed.     Psychiatry: Anxiolytics/Hypnotics - Non-controlled Passed - 04/28/2023  1:57 PM      Passed - Valid encounter within last 12 months    Recent Outpatient Visits           4 months ago Acquired hypothyroidism   [facility] [place], Danna Hefty, MD   11 months ago Iron deficiency anemia due to chronic blood loss   [facility] Alba Cory, MD   1 year ago Well adult exam   [facility] Alba Cory, MD   1 year ago Dyslipidemia   [facility] Alba Cory, MD   1 year ago Ulcer of nose (septum)   [facility] Gabriel Cirri, NP       Future Appointments  In 1 week Zane Herald, Rudolpho Sevin, FNP [facility], [place]

## 2023-05-05 NOTE — Progress Notes (Unsigned)
Name: Jean Foster   MRN: [id number]    DOB: August 26, 1986   Date:05/07/2023       Progress Note  Subjective  Chief Complaint  Chief Complaint  Patient presents with   Annual Exam    HPI  Patient presents for annual CPE.  Diet: Regular, tries to eat well balanced diet Exercise: 4 days a week 30 minutes  Last Eye Exam: 2022 Last Dental Exam: 2023  Flowsheet Row Office Visit from 05/07/2023 in [facility]  AUDIT-C Score 1      Depression: Phq 9 is  negative    05/07/2023   11:13 AM 12/30/2022   10:07 AM 05/22/2022    7:43 AM 01/24/2022   10:02 AM 11/15/2021    8:19 AM  Depression screen PHQ 2/9  Decreased Interest 0 0 0 0 0  Down, Depressed, Hopeless 0 0 0 0 0  PHQ - 2 Score 0 0 0 0 0  Altered sleeping 0 0 0 0 0  Tired, decreased energy 0 0 0 0 0  Change in appetite 0 0 0 0 0  Feeling bad or failure about yourself  0 0 0 0 0  Trouble concentrating 0 0 0 0 0  Moving slowly or fidgety/restless 0 0 0 0 0  Suicidal thoughts 0 0 0 0 0  PHQ-9 Score 0 0 0 0 0  Difficult doing work/chores Not difficult at all       Hypertension: BP Readings from Last 3 Encounters:  05/07/23 120/68  03/04/23 98/63  02/05/23 102/68   Obesity: Wt Readings from Last 3 Encounters:  05/07/23 133 lb (60.3 kg)  12/30/22 171 lb (77.6 kg)  05/22/22 171 lb (77.6 kg)   BMI Readings from Last 3 Encounters:  05/07/23 19.64 kg/m  12/30/22 25.25 kg/m  05/22/22 25.25 kg/m     Vaccines:  HPV: up to at age 43 , ask insurance if age between 46-45  Shingrix: 23-64 yo and ask insurance if covered when patient above 78 yo Pneumonia:  educated and discussed with patient. Flu:  educated and discussed with patient.     Hep C Screening: completed STD testing and prevention (HIV/chl/gon/syphilis): completed Intimate partner violence: negative screen  Sexual History : yes,  not on birth control,  declined birth control Menstrual History/LMP/Abnormal Bleeding: [facility]: 05/02/2023,  regular Discussed importance of follow up if any post-menopausal bleeding: yes  Incontinence Symptoms: negative for symptoms   Breast cancer:  - Last Mammogram: NA - BRCA gene screening: none  Osteoporosis Prevention : Discussed high calcium and vitamin D supplementation, weight bearing exercises Bone density :not applicable   Cervical cancer screening:   Skin cancer: Discussed monitoring for atypical lesions  Colorectal cancer: NA   Lung cancer:  Low Dose CT Chest recommended if Age 27-80 years, 20 pack-year currently smoking OR have quit w/in 15years. Patient does not qualify for screen   ECG: 03/25/2022  Advanced Care Planning: A voluntary discussion about advance care planning including the explanation and discussion of advance directives.  Discussed health care proxy and Living will, and the patient was able to identify a health care proxy as June [place].  Patient does not have a living will and power of attorney of health care   Lipids: Lab Results  Component Value Date   CHOL 168 01/24/2022   CHOL 218 (H) 03/28/2021   CHOL 219 (H) 04/10/2020   Lab Results  Component Value Date   HDL 61 01/24/2022   HDL 64 03/28/2021  HDL 56 04/10/2020   Lab Results  Component Value Date   LDLCALC 86 01/24/2022   LDLCALC 125 (H) 03/28/2021   LDLCALC 123 (H) 04/10/2020   Lab Results  Component Value Date   TRIG 115 01/24/2022   TRIG 169 (H) 03/28/2021   TRIG 246 (H) 04/10/2020   Lab Results  Component Value Date   CHOLHDL 2.8 01/24/2022   CHOLHDL 3.4 03/28/2021   CHOLHDL 3.9 04/10/2020   No results found for: "LDLDIRECT"  Glucose: Glucose, Bld  Date Value Ref Range Status  03/24/2022 99 70 - 99 mg/dL Final    Comment:    Glucose reference range applies only to samples taken after fasting for at least 8 hours.  01/24/2022 79 65 - 99 mg/dL Final    Comment:    .            Fasting reference interval .   03/28/2021 83 65 - 99 mg/dL Final    Comment:    .             Fasting reference interval .     Patient Active Problem List   Diagnosis Date Noted   Iron deficiency anemia due to chronic blood loss 05/22/2022   Exercise-induced asthma 11/15/2021   Bipolar disorder with depression (HCC) 04/10/2020   Acquired hypothyroidism 04/30/2019   Panic attack 04/13/2019   Cystic acne 01/01/2019    Past Surgical History:  Procedure Laterality Date   NO PAST SURGERIES     TOOTH EXTRACTION      Family History  Problem Relation Age of Onset   Hypertension Mother    COPD Father    Depression Sister    Depression Sister    Bipolar disorder Maternal Grandmother    Bladder Cancer Maternal Grandfather    Lung cancer Paternal Grandmother    Alcoholism Paternal Grandfather    Cirrhosis Paternal Grandfather    Breast cancer Neg Hx    Ovarian cancer Neg Hx    Colon cancer Neg Hx    Diabetes Neg Hx     Social History   Socioeconomic History   Marital status: Divorced    Spouse name: Not on file   Number of children: 2   Years of education: 17   Highest education level: Bachelor's degree (e.g., BA, AB, BS)  Occupational History   Occupation: [employer]  Tobacco Use   Smoking status: Former    Current packs/day: 0.00    Average packs/day: 0.3 packs/day for 12.0 years (3.0 ttl pk-yrs)    Types: Cigarettes    Start date: 06/16/2005    Quit date: 06/16/2017    Years since quitting: 5.8   Smokeless tobacco: Never  Vaping Use   Vaping status: Never Used  Substance and Sexual Activity   Alcohol use: Yes    Alcohol/week: 2.0 - 3.0 standard drinks of alcohol    Types: 2 - 3 Cans of beer per week    Comment: once a week   Drug use: No    Comment: last used 03/2017   Sexual activity: Yes    Partners: Male    Birth control/protection: Pill  Other Topics Concern   Not on file  Social History Narrative   Lives with partner and their two sons   Social Determinants of Health   Financial Resource Strain: Low Risk  (05/07/2023)   Overall Financial  Resource Strain (CARDIA)    Difficulty of Paying Living Expenses: Not hard at all  Recent Concern: Financial  Resource Strain - High Risk (05/07/2023)   Overall Financial Resource Strain (CARDIA)    Difficulty of Paying Living Expenses: Very hard  Food Insecurity: No Food Insecurity (05/07/2023)   Hunger Vital Sign    Worried About Running Out of Food in the Last Year: Never true    Ran Out of Food in the Last Year: Never true  Transportation Needs: No Transportation Needs (05/07/2023)   PRAPARE - Administrator, Civil Service (Medical): No    Lack of Transportation (Non-Medical): No  Physical Activity: Sufficiently Active (05/07/2023)   Exercise Vital Sign    Days of Exercise per Week: 4 days    Minutes of Exercise per Session: 60 min  Stress: No Stress Concern Present (05/07/2023)   [employer] of Occupational Health - Occupational Stress Questionnaire    Feeling of Stress : Not at all  Social Connections: Moderately Isolated (05/07/2023)   Social Connection and Isolation Panel [NHANES]    Frequency of Communication with Friends and Family: More than three times a week    Frequency of Social Gatherings with Friends and Family: More than three times a week    Attends Religious Services: Never    Database administrator or Organizations: No    Attends Banker Meetings: Never    Marital Status: Married  Catering manager Violence: Not At Risk (05/07/2023)   Humiliation, Afraid, Rape, and Kick questionnaire    Fear of Current or Ex-Partner: No    Emotionally Abused: No    Physically Abused: No    Sexually Abused: No     Current Outpatient Medications:    Adapalene (DIFFERIN) 0.3 % gel, Apply a pea sized amount to the entire face QHS., Disp: 45 g, Rfl: 6   albuterol (VENTOLIN HFA) 108 (90 Base) MCG/ACT inhaler, Inhale 2 puffs into the lungs every 6 (six) hours as needed for wheezing or shortness of breath., Disp: 6.7 g, Rfl: 0   buPROPion (WELLBUTRIN XL) 150  MG 24 hr tablet, Take 1 tablet (150 mg total) by mouth daily., Disp: 30 tablet, Rfl: 0   busPIRone (BUSPAR) 10 MG tablet, Take 1 tablet (10 mg total) by mouth 2 (two) times daily as needed., Disp: 60 tablet, Rfl: 0   hydrOXYzine (ATARAX) 25 MG tablet, Take 1 tablet (25 mg total) by mouth 3 (three) times daily as needed for anxiety., Disp: 100 tablet, Rfl: 1   levothyroxine (SYNTHROID) 25 MCG tablet, Take 1 tablet (25 mcg total) by mouth daily before breakfast., Disp: 90 tablet, Rfl: 0   Multiple Vitamin (MULTIVITAMIN) tablet, Take 1 tablet by mouth daily., Disp: , Rfl:    spironolactone (ALDACTONE) 50 MG tablet, Take 2 tablets by mouth every morning and 1 tablet at night as directed, Disp: 90 tablet, Rfl: 11   vitamin B-12 (CYANOCOBALAMIN) 500 MCG tablet, Take 500 mcg by mouth daily., Disp: , Rfl:    vitamin C (ASCORBIC ACID) 500 MG tablet, Take 500 mg by mouth daily., Disp: , Rfl:    doxycycline (MONODOX) 100 MG capsule, Take 1 capsule (100 mg total) by mouth 2 (two) times daily. Take with food and drink (Patient not taking: Reported on 05/07/2023), Disp: 14 capsule, Rfl: 0   doxycycline (PERIOSTAT) 20 MG tablet, Take 1 tablet (20 mg total) by mouth 2 (two) times daily. Take with food. (Patient not taking: Reported on 05/07/2023), Disp: 60 tablet, Rfl: 3  No Known Allergies   ROS  Constitutional: Negative for fever or weight change.  Respiratory: Negative for cough and shortness of breath.   Cardiovascular: Negative for chest pain or palpitations.  Gastrointestinal: Negative for abdominal pain, no bowel changes.  Musculoskeletal: Negative for gait problem or joint swelling.  Skin: Negative for rash.  Neurological: Negative for dizziness or headache.  No other specific complaints in a complete review of systems (except as listed in HPI above).   Objective  Vitals:   05/07/23 1114  BP: 120/68  Pulse: 74  Resp: 18  Temp: 97.7 F (36.5 C)  TempSrc: Oral  SpO2: 100%  Weight: 133 lb  (60.3 kg)  Height: 5\' 9"  (1.753 m)    Body mass index is 19.64 kg/m.  Physical Exam Constitutional: Patient appears well-developed and well-nourished. No distress.  HENT: Head: Normocephalic and atraumatic. Ears: B TMs ok, no erythema or effusion; Nose: Nose normal. Mouth/Throat: Oropharynx is clear and moist. No oropharyngeal exudate.  Eyes: Conjunctivae and EOM are normal. Pupils are equal, round, and reactive to light. No scleral icterus.  Neck: Normal range of motion. Neck supple. No JVD present. No thyromegaly present.  Cardiovascular: Normal rate, regular rhythm and normal heart sounds.  No murmur heard. No BLE edema. Pulmonary/Chest: Effort normal and breath sounds normal. No respiratory distress. Abdominal: Soft. Bowel sounds are normal, no distension. There is no tenderness. no masses Breast: no lumps or masses, no nipple discharge or rashes Musculoskeletal: Normal range of motion, no joint effusions. No gross deformities Neurological: he is alert and oriented to person, place, and time. No cranial nerve deficit. Coordination, balance, strength, speech and gait are normal.  Skin: Skin is warm and dry. No rash noted. No erythema.  Psychiatric: Patient has a normal mood and affect. behavior is normal. Judgment and thought content normal.   No results found for this or any previous visit (from the past 2160 hour(s)).   Fall Risk:    05/07/2023   11:12 AM 12/30/2022    9:39 AM 05/22/2022    7:43 AM 01/24/2022   10:02 AM 11/15/2021    8:19 AM  Fall Risk   Falls in the past year? 0 0 0 0 0  Number falls in past yr: 0  0 0 0  Injury with Fall? 0  0 0 0  Risk for fall due to :  No Fall Risks No Fall Risks No Fall Risks No Fall Risks  Follow up  Falls prevention discussed Falls prevention discussed Falls prevention discussed Falls prevention discussed     Functional Status Survey: Is the patient deaf or have difficulty hearing?: No Does the patient have difficulty seeing, even  when wearing glasses/contacts?: No Does the patient have difficulty concentrating, remembering, or making decisions?: No Does the patient have difficulty walking or climbing stairs?: No Does the patient have difficulty dressing or bathing?: No Does the patient have difficulty doing errands alone such as visiting a doctor's office or shopping?: No   Assessment & Plan  1. Annual physical exam - getting labs that were ordered by Dr. Carlynn Purl back in  April. -up to date on pap -continue to eat well balanced diet -continue to stay physically active  -USPSTF grade A and B recommendations reviewed with patient; age-appropriate recommendations, preventive care, screening tests, etc discussed and encouraged; healthy living encouraged; see AVS for patient education given to patient -Discussed importance of 150 minutes of physical activity weekly, eat two servings of fish weekly, eat one serving of tree nuts ( cashews, pistachios, pecans, almonds.Marland Kitchen) every other day, eat 6 servings of fruit/vegetables  daily and drink plenty of water and avoid sweet beverages.   -Reviewed Health Maintenance: Yes.

## 2023-05-07 ENCOUNTER — Ambulatory Visit (INDEPENDENT_AMBULATORY_CARE_PROVIDER_SITE_OTHER): Payer: 59 | Admitting: Nurse Practitioner

## 2023-05-07 ENCOUNTER — Encounter: Payer: Self-pay | Admitting: Nurse Practitioner

## 2023-05-07 ENCOUNTER — Other Ambulatory Visit: Payer: Self-pay

## 2023-05-07 VITALS — BP 120/68 | HR 74 | Temp 97.7°F | Resp 18 | Ht 69.0 in | Wt 133.0 lb

## 2023-05-07 DIAGNOSIS — Z79899 Other long term (current) drug therapy: Secondary | ICD-10-CM | POA: Diagnosis not present

## 2023-05-07 DIAGNOSIS — E538 Deficiency of other specified B group vitamins: Secondary | ICD-10-CM | POA: Diagnosis not present

## 2023-05-07 DIAGNOSIS — Z0000 Encounter for general adult medical examination without abnormal findings: Secondary | ICD-10-CM | POA: Diagnosis not present

## 2023-05-07 DIAGNOSIS — E039 Hypothyroidism, unspecified: Secondary | ICD-10-CM | POA: Diagnosis not present

## 2023-05-07 DIAGNOSIS — Z862 Personal history of diseases of the blood and blood-forming organs and certain disorders involving the immune mechanism: Secondary | ICD-10-CM | POA: Diagnosis not present

## 2023-05-07 DIAGNOSIS — E785 Hyperlipidemia, unspecified: Secondary | ICD-10-CM | POA: Diagnosis not present

## 2023-05-08 LAB — COMPLETE METABOLIC PANEL WITH GFR: BUN: 11 mg/dL (ref 7–25)

## 2023-05-08 LAB — TSH: TSH: 1.96 mIU/L

## 2023-05-08 LAB — CBC WITH DIFFERENTIAL/PLATELET
Neutrophils Relative %: 56 %
Platelets: 324 10*3/uL (ref 140–400)

## 2023-05-08 LAB — LIPID PANEL
LDL Cholesterol (Calc): 79 mg/dL (calc)
Non-HDL Cholesterol (Calc): 95 mg/dL (calc) (ref <130)

## 2023-05-29 ENCOUNTER — Other Ambulatory Visit: Payer: Self-pay

## 2023-05-29 ENCOUNTER — Other Ambulatory Visit: Payer: Self-pay | Admitting: Family Medicine

## 2023-05-29 DIAGNOSIS — F319 Bipolar disorder, unspecified: Secondary | ICD-10-CM

## 2023-05-29 DIAGNOSIS — E039 Hypothyroidism, unspecified: Secondary | ICD-10-CM

## 2023-05-29 DIAGNOSIS — F410 Panic disorder [episodic paroxysmal anxiety] without agoraphobia: Secondary | ICD-10-CM

## 2023-05-29 MED ORDER — BUPROPION HCL ER (XL) 150 MG PO TB24
150.0000 mg | ORAL_TABLET | Freq: Every day | ORAL | 0 refills | Status: DC
Start: 2023-05-29 — End: 2023-07-11
  Filled 2023-05-29: qty 30, 30d supply, fill #0

## 2023-05-29 MED ORDER — LEVOTHYROXINE SODIUM 25 MCG PO TABS
25.0000 ug | ORAL_TABLET | Freq: Every day | ORAL | 0 refills | Status: DC
Start: 2023-05-29 — End: 2023-07-11
  Filled 2023-05-29: qty 30, 30d supply, fill #0

## 2023-06-02 DIAGNOSIS — F411 Generalized anxiety disorder: Secondary | ICD-10-CM | POA: Diagnosis not present

## 2023-06-11 ENCOUNTER — Other Ambulatory Visit: Payer: Self-pay

## 2023-06-11 ENCOUNTER — Other Ambulatory Visit: Payer: Self-pay | Admitting: Family Medicine

## 2023-06-11 ENCOUNTER — Encounter: Payer: No Typology Code available for payment source | Admitting: Dermatology

## 2023-06-11 DIAGNOSIS — F410 Panic disorder [episodic paroxysmal anxiety] without agoraphobia: Secondary | ICD-10-CM

## 2023-06-11 DIAGNOSIS — F319 Bipolar disorder, unspecified: Secondary | ICD-10-CM

## 2023-06-11 MED ORDER — BUSPIRONE HCL 10 MG PO TABS
10.0000 mg | ORAL_TABLET | Freq: Two times a day (BID) | ORAL | 0 refills | Status: DC | PRN
  Filled 2023-06-11: qty 60, 30d supply, fill #0

## 2023-06-13 DIAGNOSIS — F411 Generalized anxiety disorder: Secondary | ICD-10-CM | POA: Diagnosis not present

## 2023-06-20 DIAGNOSIS — F411 Generalized anxiety disorder: Secondary | ICD-10-CM | POA: Diagnosis not present

## 2023-06-24 ENCOUNTER — Other Ambulatory Visit: Payer: Self-pay

## 2023-06-24 ENCOUNTER — Ambulatory Visit (INDEPENDENT_AMBULATORY_CARE_PROVIDER_SITE_OTHER): Payer: 59 | Admitting: Dermatology

## 2023-06-24 ENCOUNTER — Encounter: Payer: Self-pay | Admitting: Dermatology

## 2023-06-24 DIAGNOSIS — L700 Acne vulgaris: Secondary | ICD-10-CM

## 2023-06-24 MED ORDER — SPIRONOLACTONE 50 MG PO TABS
ORAL_TABLET | ORAL | 11 refills | Status: DC
Start: 2023-06-24 — End: 2024-06-24
  Filled 2023-06-24: qty 90, 30d supply, fill #0
  Filled 2023-09-02: qty 90, 30d supply, fill #1
  Filled 2023-10-07: qty 90, 30d supply, fill #2
  Filled 2023-12-08: qty 90, 30d supply, fill #3
  Filled 2024-01-06: qty 90, 30d supply, fill #4
  Filled 2024-03-22: qty 90, 30d supply, fill #5
  Filled 2024-04-21: qty 90, 30d supply, fill #6

## 2023-06-24 MED ORDER — ADAPALENE 0.3 % EX GEL
CUTANEOUS | 11 refills | Status: DC
Start: 2023-06-24 — End: 2024-06-24
  Filled 2023-06-24: qty 45, 30d supply, fill #0

## 2023-06-24 NOTE — Patient Instructions (Signed)

## 2023-06-24 NOTE — Progress Notes (Signed)
   Follow-Up Visit   Subjective  Jean Foster is a 37 y.o. female who presents for the following: Patient here for a 1 year follow up on acne on her face, treating with Spironolactone 100 mg once a day and Adapalene gel with a good response   The following portions of the chart were reviewed this encounter and updated as appropriate: medications, allergies, medical history  Review of Systems:  No other skin or systemic complaints except as noted in HPI or Assessment and Plan.  Objective  Well appearing patient in no apparent distress; mood and affect are within normal limits.  Areas Examined: Face, chest and back  Relevant exam findings are noted in the Assessment and Plan.   Assessment & Plan    ACNE VULGARIS Exam:  clear on face  Chronic condition with duration or expected duration over one year. Currently well-controlled.   Treatment Plan: Continue Spironolactone 100 mg once a day  Continue Adapalene gel apply to face at bedtime  Spironolactone can cause increased urination and cause blood pressure to decrease. Please watch for signs of lightheadedness and be cautious when changing position. It can sometimes cause breast tenderness or an irregular period in premenopausal women. It can also increase potassium. The increase in potassium usually is not a concern unless you are taking other medicines that also increase potassium, so please be sure your doctor knows all of the other medications you are taking. This medication should not be taken by pregnant women.  This medicine should also not be taken together with sulfa drugs like Bactrim (trimethoprim/sulfamethexazole).    Topical retinoid medications like tretinoin/Retin-A, adapalene/Differin, tazarotene/Fabior, and Epiduo/Epiduo Forte can cause dryness and irritation when first started. Only apply a pea-sized amount to the entire affected area. Avoid applying it around the eyes, edges of mouth and creases at the nose. If you  experience irritation, use a good moisturizer first and/or apply the medicine less often. If you are doing well with the medicine, you can increase how often you use it until you are applying every night. Be careful with sun protection while using this medication as it can make you sensitive to the sun. This medicine should not be used by pregnant women.     Return in about 1 year (around 06/23/2024) for Acne .  IAngelique Holm, CMA, am acting as scribe for Elie Goody, MD .   Documentation: I have reviewed the above documentation for accuracy and completeness, and I agree with the above.  Elie Goody, MD

## 2023-06-26 DIAGNOSIS — F411 Generalized anxiety disorder: Secondary | ICD-10-CM | POA: Diagnosis not present

## 2023-06-30 ENCOUNTER — Other Ambulatory Visit: Payer: Self-pay | Admitting: Family Medicine

## 2023-06-30 ENCOUNTER — Other Ambulatory Visit: Payer: Self-pay

## 2023-06-30 DIAGNOSIS — F410 Panic disorder [episodic paroxysmal anxiety] without agoraphobia: Secondary | ICD-10-CM

## 2023-06-30 DIAGNOSIS — E039 Hypothyroidism, unspecified: Secondary | ICD-10-CM

## 2023-06-30 DIAGNOSIS — F319 Bipolar disorder, unspecified: Secondary | ICD-10-CM

## 2023-07-04 DIAGNOSIS — F411 Generalized anxiety disorder: Secondary | ICD-10-CM | POA: Diagnosis not present

## 2023-07-10 ENCOUNTER — Telehealth: Payer: Self-pay | Admitting: Family Medicine

## 2023-07-10 ENCOUNTER — Other Ambulatory Visit: Payer: Self-pay

## 2023-07-10 NOTE — Progress Notes (Signed)
Name: Jean Foster   MRN: [id number]    DOB: May 31, 1986   Date:07/11/2023       Progress Note  Subjective  Chief Complaint  Medication Refill  HPI  Hypothyroid -  taking daily  , no change in bowel movements, dry skin, dysphagia  or palpitation.  TSH has been stable, we will refill medication    Bipolar Disorder  and GAD with panic attacks: she states she has a long history of depression diagnosed with bipolar in her 89's  ( while in [place]). She states manic episodes included being promiscuous, doing drugs - smoking weed and taking random pills.  She states symptoms were severe and had to take medications in College - she states initially diagnosed with bipolar depression. She states she tried lexapro but it caused sexual dysfunction . She stopped taking Abilify on her own and does not want to resume medications because it caused her to have manic episode. She is currently taking welbutrin, buspar bid and hydroxizine prn. She states her mood is stable and does not want to change current regiment. She is aware that she does not have a mood stabilizer on board.    Acne: doing well on topical medication, doxy prn and  spironolactone, she is very pleased with results Under the care of Dr. Neale Burly   Asthma: used to have exercise induced asthma, taking albuterol very seldom, flu shot is up to date . Discussed PCV 20 but she is not interested at this time  Iron deficiency anemia: she states when thyroid was off her cycles were every two weeks for a period of time, normal flow. Cycles are regular, lasts 4-5 days , normal flow. She denies pica , SOB . Discussed a high iron diet and to add iron tablets since ferritin was low on last lab drawn  Patient Active Problem List   Diagnosis Date Noted   Iron deficiency anemia due to chronic blood loss 05/22/2022   Exercise-induced asthma 11/15/2021   Bipolar disorder with depression (HCC) 04/10/2020   Acquired hypothyroidism 04/30/2019   Panic  attack 04/13/2019   Cystic acne 01/01/2019    Past Surgical History:  Procedure Laterality Date   NO PAST SURGERIES     TOOTH EXTRACTION      Family History  Problem Relation Age of Onset   Hypertension Mother    COPD Father    Depression Sister    Depression Sister    Bipolar disorder Maternal Grandmother    Bladder Cancer Maternal Grandfather    Lung cancer Paternal Grandmother    Alcoholism Paternal Grandfather    Cirrhosis Paternal Grandfather    Breast cancer Neg Hx    Ovarian cancer Neg Hx    Colon cancer Neg Hx    Diabetes Neg Hx     Social History   Tobacco Use   Smoking status: Former    Current packs/day: 0.00    Average packs/day: 0.3 packs/day for 12.0 years (3.0 ttl pk-yrs)    Types: Cigarettes    Start date: 06/16/2005    Quit date: 06/16/2017    Years since quitting: 6.0   Smokeless tobacco: Never  Substance Use Topics   Alcohol use: Yes    Alcohol/week: 2.0 - 3.0 standard drinks of alcohol    Types: 2 - 3 Cans of beer per week    Comment: once a week     Current Outpatient Medications:    Adapalene (DIFFERIN) 0.3 % gel, Apply a pea sized amount to  the entire face before bedtime nightly., Disp: 45 g, Rfl: 11   albuterol (VENTOLIN HFA) 108 (90 Base) MCG/ACT inhaler, Inhale 2 puffs into the lungs every 6 (six) hours as needed for wheezing or shortness of breath., Disp: 6.7 g, Rfl: 0   hydrOXYzine (VISTARIL) 25 MG capsule, Take 1 capsule (25 mg total) by mouth 3 (three) times daily as needed., Disp: 30 capsule, Rfl: 0   Iron, Ferrous Sulfate, 325 (65 Fe) MG TABS, Take 1 tablet (325 mg) by mouth once daily., Disp: 100 tablet, Rfl: 0   Multiple Vitamin (MULTIVITAMIN) tablet, Take 1 tablet by mouth daily., Disp: , Rfl:    spironolactone (ALDACTONE) 50 MG tablet, Take 2 tablets by mouth every morning and 1 tablet at night as directed., Disp: 90 tablet, Rfl: 11   valACYclovir (VALTREX) 1000 MG tablet, Take 1 tablet (1,000 mg total) by mouth 2 (two) times  daily., Disp: 10 tablet, Rfl: 0   vitamin B-12 (CYANOCOBALAMIN) 500 MCG tablet, Take 500 mcg by mouth daily., Disp: , Rfl:    vitamin C (ASCORBIC ACID) 500 MG tablet, Take 500 mg by mouth daily., Disp: , Rfl:    buPROPion (WELLBUTRIN XL) 150 MG 24 hr tablet, Take 1 tablet (150 mg total) by mouth daily., Disp: 90 tablet, Rfl: 1   busPIRone (BUSPAR) 10 MG tablet, Take 1 tablet (10 mg total) by mouth 2 (two) times daily as needed., Disp: 180 tablet, Rfl: 1   doxycycline (MONODOX) 100 MG capsule, Take 1 capsule (100 mg total) by mouth 2 (two) times daily. Take with food and drink (Patient not taking: Reported on 05/07/2023), Disp: 14 capsule, Rfl: 0   doxycycline (PERIOSTAT) 20 MG tablet, Take 1 tablet (20 mg total) by mouth 2 (two) times daily. Take with food. (Patient not taking: Reported on 05/07/2023), Disp: 60 tablet, Rfl: 3   levothyroxine (SYNTHROID) 25 MCG tablet, Take 1 tablet (25 mcg total) by mouth daily before breakfast., Disp: 90 tablet, Rfl: 1  No Known Allergies  I personally reviewed active problem list, medication list, allergies, family history, social history, health maintenance with the patient/caregiver today.   ROS  Ten systems reviewed and is negative except as mentioned in HPI    Objective  Vitals:   07/11/23 0849  BP: 106/62  Pulse: 74  Resp: 16  Temp: 97.7 F (36.5 C)  TempSrc: Oral  SpO2: 99%  Weight: 131 lb 4.8 oz (59.6 kg)  Height: 5\' 9"  (1.753 m)    Body mass index is 19.39 kg/m.  Physical Exam  Constitutional: Patient appears well-developed and well-nourished. No distress.  HEENT: head atraumatic, normocephalic, pupils equal and reactive to light, neck supple, throat within normal limits Cardiovascular: Normal rate, regular rhythm and normal heart sounds.  No murmur heard. No BLE edema. Pulmonary/Chest: Effort normal and breath sounds normal. No respiratory distress. Abdominal: Soft.  There is no tenderness. Psychiatric: Patient has a normal mood  and affect. behavior is normal. Judgment and thought content normal.    PHQ2/9:    07/11/2023    8:50 AM 05/07/2023   11:13 AM 12/30/2022   10:07 AM 05/22/2022    7:43 AM 01/24/2022   10:02 AM  Depression screen PHQ 2/9  Decreased Interest 0 0 0 0 0  Down, Depressed, Hopeless 0 0 0 0 0  PHQ - 2 Score 0 0 0 0 0  Altered sleeping 0 0 0 0 0  Tired, decreased energy 0 0 0 0 0  Change in appetite 0 0  0 0 0  Feeling bad or failure about yourself  0 0 0 0 0  Trouble concentrating 0 0 0 0 0  Moving slowly or fidgety/restless 0 0 0 0 0  Suicidal thoughts 0 0 0 0 0  PHQ-9 Score 0 0 0 0 0  Difficult doing work/chores Not difficult at all Not difficult at all       phq 9 is negative   Fall Risk:    07/11/2023    8:50 AM 05/07/2023   11:12 AM 12/30/2022    9:39 AM 05/22/2022    7:43 AM 01/24/2022   10:02 AM  Fall Risk   Falls in the past year? 0 0 0 0 0  Number falls in past yr: 0 0  0 0  Injury with Fall? 0 0  0 0  Risk for fall due to : No Fall Risks  No Fall Risks No Fall Risks No Fall Risks  Follow up Falls prevention discussed;Education provided;Falls evaluation completed  Falls prevention discussed Falls prevention discussed Falls prevention discussed      Functional Status Survey: Is the patient deaf or have difficulty hearing?: No Does the patient have difficulty seeing, even when wearing glasses/contacts?: No Does the patient have difficulty concentrating, remembering, or making decisions?: No Does the patient have difficulty walking or climbing stairs?: No Does the patient have difficulty dressing or bathing?: No Does the patient have difficulty doing errands alone such as visiting a doctor's office or shopping?: No    Assessment & Plan  1. Iron deficiency anemia due to chronic blood loss  - Iron, Ferrous Sulfate, 325 (65 Fe) MG TABS; Take 1 tablet (325 mg) by mouth once daily.  Dispense: 100 tablet; Refill: 0  2. Acquired hypothyroidism  - levothyroxine (SYNTHROID)  25 MCG tablet; Take 1 tablet (25 mcg total) by mouth daily before breakfast.  Dispense: 90 tablet; Refill: 1  3. Panic attack  - busPIRone (BUSPAR) 10 MG tablet; Take 1 tablet (10 mg total) by mouth 2 (two) times daily as needed.  Dispense: 180 tablet; Refill: 1 - buPROPion (WELLBUTRIN XL) 150 MG 24 hr tablet; Take 1 tablet (150 mg total) by mouth daily.  Dispense: 90 tablet; Refill: 1 - hydrOXYzine (VISTARIL) 25 MG capsule; Take 1 capsule (25 mg total) by mouth 3 (three) times daily as needed.  Dispense: 30 capsule; Refill: 0  4. Bipolar disorder with depression (HCC)  - busPIRone (BUSPAR) 10 MG tablet; Take 1 tablet (10 mg total) by mouth 2 (two) times daily as needed.  Dispense: 180 tablet; Refill: 1 - buPROPion (WELLBUTRIN XL) 150 MG 24 hr tablet; Take 1 tablet (150 mg total) by mouth daily.  Dispense: 90 tablet; Refill: 1  5. Fever blister  - valACYclovir (VALTREX) 1000 MG tablet; Take 1 tablet (1,000 mg total) by mouth 2 (two) times daily.  Dispense: 10 tablet; Refill: 0

## 2023-07-10 NOTE — Telephone Encounter (Signed)
Pt came into the office after going to the pharmacy for her medications that she thought was there. She was then told she needed an appt and was frustrated because she thought at her CPE that they were filled. Pt did schedule an appt for Friday 07/11/23 at 8:40 with Dr Carlynn Purl. Pt has been out of the medication for 10 days. Pt needs refills on Wellbutrin and Levothyroxine to be sent to [facility] pharmacy

## 2023-07-11 ENCOUNTER — Other Ambulatory Visit: Payer: Self-pay

## 2023-07-11 ENCOUNTER — Encounter: Payer: Self-pay | Admitting: Family Medicine

## 2023-07-11 ENCOUNTER — Ambulatory Visit (INDEPENDENT_AMBULATORY_CARE_PROVIDER_SITE_OTHER): Payer: 59 | Admitting: Family Medicine

## 2023-07-11 VITALS — BP 106/62 | HR 74 | Temp 97.7°F | Resp 16 | Ht 69.0 in | Wt 131.3 lb

## 2023-07-11 DIAGNOSIS — F410 Panic disorder [episodic paroxysmal anxiety] without agoraphobia: Secondary | ICD-10-CM

## 2023-07-11 DIAGNOSIS — B001 Herpesviral vesicular dermatitis: Secondary | ICD-10-CM | POA: Diagnosis not present

## 2023-07-11 DIAGNOSIS — D500 Iron deficiency anemia secondary to blood loss (chronic): Secondary | ICD-10-CM

## 2023-07-11 DIAGNOSIS — F319 Bipolar disorder, unspecified: Secondary | ICD-10-CM | POA: Diagnosis not present

## 2023-07-11 DIAGNOSIS — Z23 Encounter for immunization: Secondary | ICD-10-CM

## 2023-07-11 DIAGNOSIS — E039 Hypothyroidism, unspecified: Secondary | ICD-10-CM | POA: Diagnosis not present

## 2023-07-11 DIAGNOSIS — F411 Generalized anxiety disorder: Secondary | ICD-10-CM | POA: Diagnosis not present

## 2023-07-11 MED ORDER — IRON (FERROUS SULFATE) 325 (65 FE) MG PO TABS
325.0000 mg | ORAL_TABLET | Freq: Every day | ORAL | 0 refills | Status: AC
  Filled 2023-07-11: qty 90, 90d supply, fill #0

## 2023-07-11 MED ORDER — BUSPIRONE HCL 10 MG PO TABS
10.0000 mg | ORAL_TABLET | Freq: Two times a day (BID) | ORAL | 1 refills | Status: DC | PRN
  Filled 2023-07-11: qty 180, 90d supply, fill #0
  Filled 2023-10-07: qty 180, 90d supply, fill #1

## 2023-07-11 MED ORDER — BUPROPION HCL ER (XL) 150 MG PO TB24
150.0000 mg | ORAL_TABLET | Freq: Every day | ORAL | 1 refills | Status: DC
  Filled 2023-07-11: qty 90, 90d supply, fill #0
  Filled 2023-10-07: qty 90, 90d supply, fill #1

## 2023-07-11 MED ORDER — LEVOTHYROXINE SODIUM 25 MCG PO TABS
25.0000 ug | ORAL_TABLET | Freq: Every day | ORAL | 1 refills | Status: DC
  Filled 2023-07-11: qty 90, 90d supply, fill #0
  Filled 2023-10-07: qty 90, 90d supply, fill #1

## 2023-07-11 MED ORDER — VALACYCLOVIR HCL 1 G PO TABS
1000.0000 mg | ORAL_TABLET | Freq: Two times a day (BID) | ORAL | 0 refills | Status: AC
  Filled 2023-07-11: qty 10, 5d supply, fill #0

## 2023-07-11 MED ORDER — HYDROXYZINE PAMOATE 25 MG PO CAPS
25.0000 mg | ORAL_CAPSULE | Freq: Three times a day (TID) | ORAL | 0 refills | Status: AC | PRN
  Filled 2023-07-11: qty 30, 10d supply, fill #0

## 2023-07-17 DIAGNOSIS — F411 Generalized anxiety disorder: Secondary | ICD-10-CM | POA: Diagnosis not present

## 2023-07-25 DIAGNOSIS — F411 Generalized anxiety disorder: Secondary | ICD-10-CM | POA: Diagnosis not present

## 2023-08-07 DIAGNOSIS — F411 Generalized anxiety disorder: Secondary | ICD-10-CM | POA: Diagnosis not present

## 2023-08-15 DIAGNOSIS — F411 Generalized anxiety disorder: Secondary | ICD-10-CM | POA: Diagnosis not present

## 2023-09-29 ENCOUNTER — Encounter: Payer: Self-pay | Admitting: Dermatology

## 2023-09-29 ENCOUNTER — Ambulatory Visit: Payer: Commercial Managed Care - PPO | Admitting: Dermatology

## 2023-09-29 DIAGNOSIS — L700 Acne vulgaris: Secondary | ICD-10-CM

## 2023-09-29 MED ORDER — TRIAMCINOLONE ACETONIDE 10 MG/ML IJ SUSP
1.2500 mg | Freq: Once | INTRAMUSCULAR | Status: AC
  Administered 2023-09-29: 1.3 mg via INTRADERMAL

## 2023-09-29 NOTE — Patient Instructions (Signed)
Intralesional steroid injection side effects were reviewed including thinning of the skin and discoloration, such as redness, lightening or darkening.  Due to recent changes in healthcare laws, you may see results of your pathology and/or laboratory studies on MyChart before the doctors have had a chance to review them. We understand that in some cases there may be results that are confusing or concerning to you. Please understand that not all results are received at the same time and often the doctors may need to interpret multiple results in order to provide you with the best plan of care or course of treatment. Therefore, we ask that you please give [place] 2 business days to thoroughly review all your results before contacting the office for clarification. Should we see a critical lab result, you will be contacted sooner.   If You Need Anything After Your Visit  If you have any questions or concerns for your doctor, please call our main line at [phone] and press option 4 to reach your doctor's medical assistant. If no one answers, please leave a voicemail as directed and we will return your call as soon as possible. Messages left after 4 pm will be answered the following business day.   You may also send [place] a message via MyChart. We typically respond to MyChart messages within 1-2 business days.  For prescription refills, please ask your pharmacy to contact our office. Our fax number is [phone].  If you have an urgent issue when the clinic is closed that cannot wait until the next business day, you can page your doctor at the number below.    Please note that while we do our best to be available for urgent issues outside of office hours, we are not available 24/7.   If you have an urgent issue and are unable to reach [place], you may choose to seek medical care at your doctor's office, retail clinic, urgent care center, or emergency room.  If you have a medical emergency, please immediately  call 911 or go to the emergency department.  Pager Numbers  - Dr. Gwen Pounds: [phone]  - Dr. Roseanne Reno: [phone]  - Dr. Katrinka Blazing: [phone]   In the event of inclement weather, please call our main line at [phone] for an update on the status of any delays or closures.  Dermatology Medication Tips: Please keep the boxes that topical medications come in in order to help keep track of the instructions about where and how to use these. Pharmacies typically print the medication instructions only on the boxes and not directly on the medication tubes.   If your medication is too expensive, please contact our office at [phone] option 4 or send [place] a message through MyChart.   We are unable to tell what your co-pay for medications will be in advance as this is different depending on your insurance coverage. However, we may be able to find a substitute medication at lower cost or fill out paperwork to get insurance to cover a needed medication.   If a prior authorization is required to get your medication covered by your insurance company, please allow [place] 1-2 business days to complete this process.  Drug prices often vary depending on where the prescription is filled and some pharmacies may offer cheaper prices.  The website www.goodrx.com contains coupons for medications through different pharmacies. The prices here do not account for what the cost may be with help from insurance (it may be cheaper with your insurance), but the website can give  you the price if you did not use any insurance.  - You can print the associated coupon and take it with your prescription to the pharmacy.  - You may also stop by our office during regular business hours and pick up a GoodRx coupon card.  - If you need your prescription sent electronically to a different pharmacy, notify our office through [facility] or by phone at [phone] option 4.     Si Usted Necesita Algo Despus de Su  Visita  Tambin puede enviarnos un mensaje a travs de Clinical cytogeneticist. Por lo general respondemos a los mensajes de MyChart en el transcurso de 1 a 2 das hbiles.  Para renovar recetas, por favor pida a su farmacia que se ponga en contacto con nuestra oficina. Annie Sable de fax es [place] [phone].  Si tiene un asunto urgente cuando la clnica est cerrada y que no puede esperar hasta el siguiente da hbil, puede llamar/localizar a su doctor(a) al nmero que aparece a continuacin.   Por favor, tenga en cuenta que aunque hacemos todo lo posible para estar disponibles para asuntos urgentes fuera del horario de [place], no estamos disponibles las 24 horas del da, los 7 [address]  [address] de la [place].   Si tiene un problema urgente y no puede comunicarse con nosotros, puede optar por buscar atencin mdica  en el consultorio de su doctor(a), en una clnica privada, en un centro de atencin urgente o en una sala de emergencias.  Si tiene Engineer, drilling, por favor llame inmediatamente al 911 o vaya a la sala de emergencias.  Nmeros de bper  - Dr. Gwen Pounds: [phone]  - Dra. Roseanne Reno: [phone]  - Dr. Katrinka Blazing: [phone]   En caso de inclemencias del tiempo, por favor llame a Lacy Duverney principal al [phone] para una actualizacin sobre el [place] de cualquier retraso o cierre.  Consejos para la medicacin en dermatologa: Por favor, guarde las cajas en las que vienen los medicamentos de uso tpico para ayudarle a seguir las instrucciones sobre dnde y cmo usarlos. Las farmacias generalmente imprimen las instrucciones del medicamento slo en las cajas y no directamente en los tubos del [place].   Si su medicamento es muy caro, por favor, pngase en contacto con Rolm Gala llamando al [phone] y presione la opcin 4 o envenos un mensaje a travs de Clinical cytogeneticist.   No podemos decirle cul ser su copago por los medicamentos por adelantado ya que esto es diferente dependiendo de  la cobertura de su seguro. Sin embargo, es posible que podamos encontrar un medicamento sustituto a Audiological scientist un formulario para que el seguro cubra el medicamento que se considera necesario.   Si se requiere una autorizacin previa para que su compaa de seguros [place] su medicamento, por favor permtanos de 1 a 2 das hbiles para completar [address]  Los precios de los medicamentos varan con frecuencia dependiendo del Environmental consultant de dnde se surte la receta y alguna farmacias pueden ofrecer precios ms baratos.  El sitio web www.goodrx.com tiene cupones para medicamentos de Health and safety inspector. Los precios aqu no tienen en cuenta lo que podra costar con la ayuda del seguro (puede ser ms barato con su seguro), pero el sitio web puede darle el precio si no utiliz Tourist information centre manager.  - Puede imprimir el cupn correspondiente y llevarlo con su receta a la farmacia.  - Tambin puede pasar por nuestra oficina durante el horario de atencin regular y Education officer, museum una tarjeta de cupones de GoodRx.  -  Si necesita que su receta se enve electrnicamente a Psychiatrist, informe a nuestra oficina a travs de MyChart de [place] o por telfono llamando al [phone] y presione la opcin 4.

## 2023-09-29 NOTE — Progress Notes (Signed)
   Follow-Up Visit   Subjective  Jean Foster is a 38 y.o. female who presents for the following: acne cyst on forehead. Very painful. Would like ILK injection. Restarted Doxycycline  100 mg, on 3rd day since this flare. Has gone down some. Itches.    The following portions of the chart were reviewed this encounter and updated as appropriate: medications, allergies, medical history  Review of Systems:  No other skin or systemic complaints except as noted in HPI or Assessment and Plan.  Objective  Well appearing patient in no apparent distress; mood and affect are within normal limits.  A focused examination was performed of the following areas: Face  Relevant physical exam findings are noted in the Assessment and Plan.  Glabella Erythematous inflamed papule  Assessment & Plan   CYSTIC ACNE Glabella Intralesional injection - Glabella Location: glabella  Informed Consent: Discussed risks (infection, pain, bleeding, bruising, thinning of the skin, loss of skin pigment, lack of resolution, and recurrence of lesion) and benefits of the procedure, as well as the alternatives. Informed consent was obtained. Preparation: The area was prepared a standard fashion.  Anesthesia: none  Procedure Details: An intralesional injection was performed with Kenalog  1.25 mg/cc. 0.05 cc in total were injected.  Total number of injections: 1  Plan: The patient was instructed on post-op care. Recommend OTC analgesia as needed for pain.   triamcinolone  acetonide (KENALOG ) 10 MG/ML injection 1.3 mg - Glabella  ACNE VULGARIS   Related Medications doxycycline  (PERIOSTAT ) 20 MG tablet Take 1 tablet (20 mg total) by mouth 2 (two) times daily. Take with food. Adapalene  (DIFFERIN ) 0.3 % gel Apply a pea sized amount to the entire face before bedtime nightly. spironolactone  (ALDACTONE ) 50 MG tablet Take 2 tablets by mouth every morning and 1 tablet at night as directed.   Return for Follow Up As  Scheduled.  I, Jill Parcell, CMA, am acting as scribe for Boneta Sharps, MD.   Documentation: I have reviewed the above documentation for accuracy and completeness, and I agree with the above.  Boneta Sharps, MD

## 2023-09-30 ENCOUNTER — Encounter: Payer: Self-pay | Admitting: Dermatology

## 2023-10-07 ENCOUNTER — Other Ambulatory Visit: Payer: Self-pay

## 2023-11-10 ENCOUNTER — Telehealth: Payer: Self-pay | Admitting: Emergency Medicine

## 2023-11-10 ENCOUNTER — Encounter: Payer: Self-pay | Admitting: Emergency Medicine

## 2023-11-10 ENCOUNTER — Ambulatory Visit: Payer: Self-pay | Admitting: Family Medicine

## 2023-11-10 ENCOUNTER — Ambulatory Visit (INDEPENDENT_AMBULATORY_CARE_PROVIDER_SITE_OTHER): Payer: Commercial Managed Care - PPO

## 2023-11-10 ENCOUNTER — Other Ambulatory Visit: Payer: Self-pay

## 2023-11-10 ENCOUNTER — Ambulatory Visit
Admission: EM | Admit: 2023-11-10 | Discharge: 2023-11-10 | Disposition: A | Discharge status: Home / Self Care | Payer: Commercial Managed Care - PPO | Attending: Emergency Medicine | Admitting: Emergency Medicine

## 2023-11-10 DIAGNOSIS — R079 Chest pain, unspecified: Secondary | ICD-10-CM | POA: Diagnosis not present

## 2023-11-10 DIAGNOSIS — R0602 Shortness of breath: Secondary | ICD-10-CM

## 2023-11-10 DIAGNOSIS — Q676 Pectus excavatum: Secondary | ICD-10-CM | POA: Diagnosis not present

## 2023-11-10 NOTE — Telephone Encounter (Signed)
 Pt went to UC and will call us back if she needs to

## 2023-11-10 NOTE — Discharge Instructions (Signed)
 Today you are evaluated for chest pain and shortness of breath, low suspicion for heart involvement  Chest x-ray is pending, you will be noted  You shows heart is beating in a regular pace and rhythm  Blood pressure and heart rate are all within normal  Please schedule follow-up appointment with your primary doctor, you have also been given information to the specialist  At any point if your chest pain worsens in severity please emergency department for full workup

## 2023-11-10 NOTE — Telephone Encounter (Signed)
 Reported chest x-ray results via telephone, 2 patient identifiers used, patient to follow-up with primary rhythm management as needed

## 2023-11-10 NOTE — Telephone Encounter (Signed)
 Chief Complaint: chest pain Symptoms: chest pain, back pain, shoulder pain Frequency: 3 months intermittent Pertinent Negatives: Patient denies dizziness/weakness/syncope, N/V, diaphoresis, palpitations Disposition: [x] ED /[] Urgent Care (no appt availability in office) / [] Appointment(In office/virtual)/ []  [place] [facility]/ [] Home Care/ [x] Refused Recommended Disposition /[] [facility]/ []  Follow-up with PCP Additional Notes: Pt reports left-sided CP intermittently for 3 months. States it is becoming more frequent. Rates pain 3/10 at rest and while on the phone with RN and 7/10 with laughing or coughing. Denies recent illness or injury or fall. Pt states she feels SOB with the chest pain and has to hold her chest and wait to catch her breath. States pain radiates to her back and L shoulder. States she thinks it could be anxiety and has not been evaluated by a  HCP for symptoms. Pt denies all other symptoms. Per protocol RN advised the ED. Pt refused. Pt said she just wants a dr appt. RN educated pt on why the ED is the best and safest place for CP. Pt asked if she can go to an UC. RN advised pt being seen today by any HCP is better than none at all. Pt said she may go to the  [facility] [place]. RN not confident she will go to the UC. RN called the [facility] to inform of refusal.   Reason for Disposition  [1] Chest pain (or "angina") comes and goes AND [2] is happening more often (increasing in frequency) or getting worse (increasing in severity)  (Exception: Chest pains that last only a few seconds.)  Answer Assessment - Initial Assessment Questions 1. LOCATION: "Where does it hurt?"       L side of chest, radiating to back and L shoulder blade  2. RADIATION: "Does the pain go anywhere else?" (e.g., into neck, jaw, arms, back)     Radiates to back and L shoulder blade  3. ONSET: "When did the chest pain begin?" (Minutes, hours or days)      3 months intermittently, happening more  frequently 4. PATTERN: "Does the pain come and go, or has it been constant since it started?"  "Does it get worse with exertion?"      Hurts worse with laughing/coughing, worse on the L side, straight through the back to L shoulder blade 5. DURATION: "How long does it last" (e.g., seconds, minutes, hours)     Goes back down when she is done coughing or laughing, always has CP with laughing or coughing 6. SEVERITY: "How bad is the pain?"  (e.g., Scale 1-10; mild, moderate, or severe)    - MILD (1-3): doesn't interfere with normal activities     - MODERATE (4-7): interferes with normal activities or awakens from sleep    - SEVERE (8-10): excruciating pain, unable to do any normal activities       With laughing or coughing it is a 7/10, 3/10 when she stops laughing 7. CARDIAC RISK FACTORS: "Do you have any history of heart problems or risk factors for heart disease?" (e.g., angina, prior heart attack; diabetes, high blood pressure, high cholesterol, smoker, or strong family history of heart disease)     No 8. PULMONARY RISK FACTORS: "Do you have any history of lung disease?"  (e.g., blood clots in lung, asthma, emphysema, birth control pills)     No 9. CAUSE: "What do you think is causing the chest pain?"     Not sure  10. OTHER SYMPTOMS: "Do you have any other symptoms?" (e.g.,  dizziness, nausea, vomiting, sweating, fever, difficulty breathing, cough) Intermittent difficulty breathing ("honestly I don't even know, I chalk it up to anxiety and go on about my day" - endorses SOB at rest but none right now, states she feels like this a couple times a week), difficulty breathing with chest pain, "when I take a deep breath I can catch my breath better", "never been an episode where I can't catch my own breath", denies weakness/dizziness/passing out, no diaphoresis, no N/V, cough "but not often"  Protocols used: Chest Pain-A-AH

## 2023-11-10 NOTE — ED Provider Notes (Addendum)
 Jean Foster    CSN: [id number] Arrival date & time: 11/10/23  1105      History   Chief Complaint Chief Complaint  Patient presents with   Chest Pain   Shortness of Breath    HPI Jean Foster is a 38 y.o. female.   Patient presents for evaluation of intermittent left-sided chest pain, shortness of breath present for 3 to 4 months.  Symptoms becoming more prominent with intensity and frequency.  Was occurring a few times a week but now occurring daily, when has cough started grab the chest during event.  Pain does directly from the chest into left side of the upper back.  Symptoms present when coughing with movement.  Denies injury or trauma.  Has attempted use of Advil and Tylenol with minimal relief.  Denies personal cardiac history, family history of hypertension.  Denies recent respiratory illness.  History of asthma.   Past Medical History:  Diagnosis Date   Acne    Anxiety    Asthma    Depression    Depression, major, in remission (HCC) 07/05/2019   Thyroid disease     Patient Active Problem List   Diagnosis Date Noted   Iron deficiency anemia due to chronic blood loss 05/22/2022   Exercise-induced asthma 11/15/2021   Bipolar disorder with depression (HCC) 04/10/2020   Acquired hypothyroidism 04/30/2019   Panic attack 04/13/2019   Cystic acne 01/01/2019    Past Surgical History:  Procedure Laterality Date   NO PAST SURGERIES     TOOTH EXTRACTION      OB History     Gravida  2   Para  2   Term  2   Preterm      AB      Living  2      SAB      IAB      Ectopic      Multiple  0   Live Births  2            Home Medications    Prior to Admission medications   Medication Sig Start Date End Date Taking? Authorizing Provider  Adapalene (DIFFERIN) 0.3 % gel Apply a pea sized amount to the entire face before bedtime nightly. 06/24/23   Elie Goody, MD  albuterol (VENTOLIN HFA) 108 (90 Base) MCG/ACT inhaler Inhale 2  puffs into the lungs every 6 (six) hours as needed for wheezing or shortness of breath. 12/30/22   Alba Cory, MD  buPROPion (WELLBUTRIN XL) 150 MG 24 hr tablet Take 1 tablet (150 mg total) by mouth daily. 07/11/23 07/10/24  Alba Cory, MD  busPIRone (BUSPAR) 10 MG tablet Take 1 tablet (10 mg total) by mouth 2 (two) times daily as needed. 07/11/23   Alba Cory, MD  doxycycline (MONODOX) 100 MG capsule Take 1 capsule (100 mg total) by mouth 2 (two) times daily. Take with food and drink Patient not taking: Reported on 05/07/2023 02/05/23   Neale Burly, [place], MD  doxycycline (PERIOSTAT) 20 MG tablet Take 1 tablet (20 mg total) by mouth 2 (two) times daily. Take with food. Patient not taking: Reported on 05/07/2023 02/05/23   Neale Burly, [place], MD  hydrOXYzine (VISTARIL) 25 MG capsule Take 1 capsule (25 mg total) by mouth 3 (three) times daily as needed. 07/11/23   Alba Cory, MD  Iron, Ferrous Sulfate, 325 (65 Fe) MG TABS Take 1 tablet (325 mg) by mouth once daily. 07/11/23   Alba Cory, MD  levothyroxine (SYNTHROID) 25 MCG  tablet Take 1 tablet (25 mcg total) by mouth daily before breakfast. 07/11/23   Alba Cory, MD  Multiple Vitamin (MULTIVITAMIN) tablet Take 1 tablet by mouth daily.    [provider]  spironolactone (ALDACTONE) 50 MG tablet Take 2 tablets by mouth every morning and 1 tablet at night as directed. 06/24/23   Elie Goody, MD  valACYclovir (VALTREX) 1000 MG tablet Take 1 tablet (1,000 mg total) by mouth 2 (two) times daily. 07/11/23   Alba Cory, MD  vitamin B-12 (CYANOCOBALAMIN) 500 MCG tablet Take 500 mcg by mouth daily.    [provider]  vitamin C (ASCORBIC ACID) 500 MG tablet Take 500 mg by mouth daily.    [provider]    Family History Family History  Problem Relation Age of Onset   Hypertension Mother    COPD Father    Depression Sister    Depression Sister    Bipolar disorder Maternal Grandmother    Bladder  Cancer Maternal Grandfather    Lung cancer Paternal Grandmother    Alcoholism Paternal Grandfather    Cirrhosis Paternal Grandfather    Breast cancer Neg Hx    Ovarian cancer Neg Hx    Colon cancer Neg Hx    Diabetes Neg Hx     Social History Social History   Tobacco Use   Smoking status: Former    Current packs/day: 0.00    Average packs/day: 0.3 packs/day for 12.0 years (3.0 ttl pk-yrs)    Types: Cigarettes    Start date: 06/16/2005    Quit date: 06/16/2017    Years since quitting: 6.4   Smokeless tobacco: Never  Vaping Use   Vaping status: Never Used  Substance Use Topics   Alcohol use: Yes    Alcohol/week: 2.0 - 3.0 standard drinks of alcohol    Types: 2 - 3 Cans of beer per week    Comment: once a week   Drug use: No    Comment: last used 03/2017     Allergies   Patient has no known allergies.   Review of Systems Review of Systems   Physical Exam Triage Vital Signs ED Triage Vitals [11/10/23 1148]  Encounter Vitals Group     BP      Systolic BP Percentile      Diastolic BP Percentile      Pulse      Resp      Temp      Temp src      SpO2      Weight      Height      Head Circumference      Peak Flow      Pain Score 3     Pain Loc      Pain Education      Exclude from Growth Chart    No data found.  Updated Vital Signs LMP 11/01/2023   Visual Acuity Right Eye Distance:   Left Eye Distance:   Bilateral Distance:    Right Eye Near:   Left Eye Near:    Bilateral Near:     Physical Exam Constitutional:      Appearance: Normal appearance.  Eyes:     Extraocular Movements: Extraocular movements intact.  Cardiovascular:     Rate and Rhythm: Normal rate and regular rhythm.     Pulses: Normal pulses.     Heart sounds: Normal heart sounds.  Pulmonary:     Effort: Pulmonary effort is normal.  Breath sounds: Normal breath sounds.  Neurological:     Mental Status: She is alert and oriented to person, place, and time. Mental status is at  baseline.      UC Treatments / Results  Labs (all labs ordered are listed, but only abnormal results are displayed) Labs Reviewed - No data to display  EKG   Radiology No results found.  Procedures Procedures (including critical care time)  Medications Ordered in UC Medications - No data to display  Initial Impression / Assessment and Plan / UC Course  I have reviewed the triage vital signs and the nursing notes.  Pertinent labs & imaging results that were available during my care of the patient were reviewed by me and considered in my medical decision making (see chart for details).  Chest pain, shortness of breath  Vital signs are stable, blood pressure and heart rate shows normal sinus rhythm, compared to prior EKGs obtained, S1 and S2 heard to auscultation and lungs are clear, chest x-ray is pending, chest pain is not reproducible , declined Steroid prescription and advised follow-up with PCP symptoms,  patient to go to the nearest emergency Final Clinical Impressions(s) / UC Diagnoses   Final diagnoses:  SOB (shortness of breath)  Chest pain, unspecified type     Discharge Instructions      Today you are evaluated for chest pain and shortness of breath, low suspicion for heart involvement  Chest x-ray is pending, you will be noted  You shows heart is beating in a regular pace and rhythm  Blood pressure and heart rate are all within normal  Please schedule follow-up appointment with your primary doctor, you have also been given information to the specialist  At any point if your chest pain worsens in severity please emergency department for full workup   ED Prescriptions   None    PDMP not reviewed this encounter.   Valinda Hoar, NP 11/10/23 1324    Valinda Hoar, NP 11/10/23 1325

## 2023-11-10 NOTE — ED Triage Notes (Signed)
 Patient presents to [facility] for evaluation of left sided chest pain with coughing or laughing x 3 months, intermittent, but worse with each episode.   Patient denies any new changes to meds or situation, except a pulled tooth in November.

## 2023-12-26 ENCOUNTER — Ambulatory Visit: Payer: Self-pay

## 2023-12-26 ENCOUNTER — Other Ambulatory Visit: Payer: Self-pay

## 2023-12-26 ENCOUNTER — Encounter: Payer: Self-pay | Admitting: Nurse Practitioner

## 2023-12-26 ENCOUNTER — Telehealth: Admitting: Nurse Practitioner

## 2023-12-26 DIAGNOSIS — B379 Candidiasis, unspecified: Secondary | ICD-10-CM

## 2023-12-26 DIAGNOSIS — J029 Acute pharyngitis, unspecified: Secondary | ICD-10-CM

## 2023-12-26 DIAGNOSIS — T3695XA Adverse effect of unspecified systemic antibiotic, initial encounter: Secondary | ICD-10-CM | POA: Diagnosis not present

## 2023-12-26 DIAGNOSIS — Z20818 Contact with and (suspected) exposure to other bacterial communicable diseases: Secondary | ICD-10-CM

## 2023-12-26 DIAGNOSIS — J020 Streptococcal pharyngitis: Secondary | ICD-10-CM

## 2023-12-26 LAB — POCT RAPID STREP A (OFFICE): Rapid Strep A Screen: POSITIVE — AB

## 2023-12-26 MED ORDER — FLUCONAZOLE 150 MG PO TABS
150.0000 mg | ORAL_TABLET | ORAL | 0 refills | Status: DC | PRN
  Filled 2023-12-26: qty 2, 6d supply, fill #0

## 2023-12-26 MED ORDER — AMOXICILLIN 500 MG PO CAPS
500.0000 mg | ORAL_CAPSULE | Freq: Two times a day (BID) | ORAL | 0 refills | Status: AC
Start: 2023-12-26 — End: 2024-01-05
  Filled 2023-12-26: qty 20, 10d supply, fill #0

## 2023-12-26 NOTE — Progress Notes (Signed)
 Name: Jean Foster   MRN: [id number]    DOB: 17-Dec-1985   Date:12/26/2023       Progress Note  Subjective  Chief Complaint  Chief Complaint  Patient presents with   Sore Throat    I connected with  Darien Ramus  on 12/26/23 at  2:00 PM EDT by a video enabled telemedicine application and verified that I am speaking with the correct person using two identifiers.  I discussed the limitations of evaluation and management by telemedicine and the availability of in person appointments. The patient expressed understanding and agreed to proceed with a virtual visit  Staff also discussed with the patient that there may be a patient responsible charge related to this service. Patient Location: home Provider Location: [facility] Additional Individuals present: alone  HPI   Discussed the use of AI scribe software for clinical note transcription with the patient, who gave verbal consent to proceed.  History of Present Illness She presents with fever, sore throat, and headache. She is currently waiting for her husband to return home so she can seek further medical attention without leaving her children unattended.  She experienced the onset of fever, sore throat, and a severe headache starting this morning. These symptoms are similar to those her son had earlier in the week, who was diagnosed with strep throat after a medical evaluation.  She has taken Tylenol, which has been effective in reducing her fever. No cough or sinus congestion is present at this time.   She is going to swing by and have rapid strep done.  Will also do a throat assessment.  Patient Active Problem List   Diagnosis Date Noted   Iron deficiency anemia due to chronic blood loss 05/22/2022   Exercise-induced asthma 11/15/2021   Bipolar disorder with depression (HCC) 04/10/2020   Acquired hypothyroidism 04/30/2019   Panic attack 04/13/2019   Cystic acne 01/01/2019    Social History   Tobacco Use   Smoking status:  Former    Current packs/day: 0.00    Average packs/day: 0.3 packs/day for 12.0 years (3.0 ttl pk-yrs)    Types: Cigarettes    Start date: 06/16/2005    Quit date: 06/16/2017    Years since quitting: 6.5   Smokeless tobacco: Never  Substance Use Topics   Alcohol use: Yes    Alcohol/week: 2.0 - 3.0 standard drinks of alcohol    Types: 2 - 3 Cans of beer per week    Comment: once a week     Current Outpatient Medications:    amoxicillin (AMOXIL) 500 MG capsule, Take 1 capsule (500 mg total) by mouth 2 (two) times daily for 10 days., Disp: 20 capsule, Rfl: 0   fluconazole (DIFLUCAN) 150 MG tablet, Take 1 tablet (150 mg total) by mouth every 3 (three) days as needed (for vaginal itching/yeast infection sx)., Disp: 2 tablet, Rfl: 0   Adapalene (DIFFERIN) 0.3 % gel, Apply a pea sized amount to the entire face before bedtime nightly., Disp: 45 g, Rfl: 11   albuterol (VENTOLIN HFA) 108 (90 Base) MCG/ACT inhaler, Inhale 2 puffs into the lungs every 6 (six) hours as needed for wheezing or shortness of breath., Disp: 6.7 g, Rfl: 0   buPROPion (WELLBUTRIN XL) 150 MG 24 hr tablet, Take 1 tablet (150 mg total) by mouth daily., Disp: 90 tablet, Rfl: 1   busPIRone (BUSPAR) 10 MG tablet, Take 1 tablet (10 mg total) by mouth 2 (two) times daily as needed., Disp: 180 tablet, Rfl: 1  doxycycline (MONODOX) 100 MG capsule, Take 1 capsule (100 mg total) by mouth 2 (two) times daily. Take with food and drink (Patient not taking: Reported on 05/07/2023), Disp: 14 capsule, Rfl: 0   doxycycline (PERIOSTAT) 20 MG tablet, Take 1 tablet (20 mg total) by mouth 2 (two) times daily. Take with food. (Patient not taking: Reported on 05/07/2023), Disp: 60 tablet, Rfl: 3   hydrOXYzine (VISTARIL) 25 MG capsule, Take 1 capsule (25 mg total) by mouth 3 (three) times daily as needed., Disp: 30 capsule, Rfl: 0   Iron, Ferrous Sulfate, 325 (65 Fe) MG TABS, Take 1 tablet (325 mg) by mouth once daily., Disp: 100 tablet, Rfl: 0    levothyroxine (SYNTHROID) 25 MCG tablet, Take 1 tablet (25 mcg total) by mouth daily before breakfast., Disp: 90 tablet, Rfl: 1   Multiple Vitamin (MULTIVITAMIN) tablet, Take 1 tablet by mouth daily., Disp: , Rfl:    spironolactone (ALDACTONE) 50 MG tablet, Take 2 tablets by mouth every morning and 1 tablet at night as directed., Disp: 90 tablet, Rfl: 11   valACYclovir (VALTREX) 1000 MG tablet, Take 1 tablet (1,000 mg total) by mouth 2 (two) times daily., Disp: 10 tablet, Rfl: 0   vitamin B-12 (CYANOCOBALAMIN) 500 MCG tablet, Take 500 mcg by mouth daily., Disp: , Rfl:    vitamin C (ASCORBIC ACID) 500 MG tablet, Take 500 mg by mouth daily., Disp: , Rfl:   No Known Allergies  I personally reviewed active problem list, medication list, allergies with the patient/caregiver today.  ROS  Ten systems reviewed and is negative except as mentioned in HPI   Objective  Virtual encounter, vitals not obtained.  There is no height or weight on file to calculate BMI.  Nursing Note and Vital Signs reviewed.  Physical Exam  Awake, alert and oriented, speaking in complete sentences     Results for orders placed or performed in visit on 12/26/23 (from the past 72 hours)  POCT rapid strep A     Status: Abnormal   Collection Time: 12/26/23  2:39 PM  Result Value Ref Range   Rapid Strep A Screen Positive (A) Negative    Assessment & Plan  Assessment and Plan Assessment & Plan Suspected Streptococcal Pharyngitis Acute onset of fever, sore throat, and headache, consistent with streptococcal pharyngitis, especially given recent exposure from her son who tested positive for strep. Absence of cough or sinus congestion aligns with typical presentation of strep throat. No known drug allergies, facilitating antibiotic choice upon confirmation. - Perform throat swab to confirm streptococcal infection - Prescribe antibiotic upon confirmation of strep infection - Coordinate swab collection while she  remains in the car for convenience    Rapid strep:positive Throat looks erythematous with exudate  Patient treated with amoxicillin 500 mg BID for 10 days Can gargle with salt water, drink hot tea with honey, use throat lozenges.    She also reports she gets a yeast infection with antibiotics will send in diflucan as well.   -Red flags and when to present for emergency care or RTC including fever >101.57F, chest pain, shortness of breath, new/worsening/un-resolving symptoms,  reviewed with patient at time of visit. Follow up and care instructions discussed and provided in AVS. - I discussed the assessment and treatment plan with the patient. The patient was provided an opportunity to ask questions and all were answered. The patient agreed with the plan and demonstrated an understanding of the instructions.  I provided 15 minutes of non-face-to-face time during this encounter.  Berniece Salines, FNP

## 2023-12-26 NOTE — Telephone Encounter (Signed)
 Reason for Triage: Patient calling after her son tested positive for strep yesterday and now she is running a fever and also a sore throat headache, patient would like to know if she could get a prescription for her symptoms  Patients number [phone] (M)

## 2023-12-26 NOTE — Telephone Encounter (Signed)
    Chief Complaint: Sore throat, son positive for strep yesterday.Fever 100.8 Symptoms: Above Frequency: Today Pertinent Negatives: Patient denies  Disposition: [] ED /[] Urgent Care (no appt availability in office) / [x] Appointment(In office/virtual)/ []  [facility]/ [] Home Care/ [] Refused Recommended Disposition /[] [facility]/ []  Follow-up with PCP Additional Notes: Agrees with appointment.  Reason for Disposition  SEVERE (e.g., excruciating) throat pain  Answer Assessment - Initial Assessment Questions 1. ONSET: "When did the throat start hurting?" (Hours or days ago)      Today 2. SEVERITY: "How bad is the sore throat?" (Scale 1-10; mild, moderate or severe)   - MILD (1-3):  Doesn't interfere with eating or normal activities.   - MODERATE (4-7): Interferes with eating some solids and normal activities.   - SEVERE (8-10):  Excruciating pain, interferes with most normal activities.   - SEVERE WITH DYSPHAGIA (10): Can't swallow liquids, drooling.     Moderate 3. STREP EXPOSURE: "Has there been any exposure to strep within the past week?" If Yes, ask: "What type of contact occurred?"      Yes 4.  VIRAL SYMPTOMS: "Are there any symptoms of a cold, such as a runny nose, cough, hoarse voice or red eyes?"      No 5. FEVER: "Do you have a fever?" If Yes, ask: "What is your temperature, how was it measured, and when did it start?"     100.8 6. PUS ON THE TONSILS: "Is there pus on the tonsils in the back of your throat?"     No 7. OTHER SYMPTOMS: "Do you have any other symptoms?" (e.g., difficulty breathing, headache, rash)     No 8. PREGNANCY: "Is there any chance you are pregnant?" "When was your last menstrual period?"     No  Protocols used: Sore Throat-A-AH

## 2024-01-06 ENCOUNTER — Other Ambulatory Visit: Payer: Self-pay

## 2024-01-06 ENCOUNTER — Other Ambulatory Visit: Payer: Self-pay | Admitting: Family Medicine

## 2024-01-06 DIAGNOSIS — F319 Bipolar disorder, unspecified: Secondary | ICD-10-CM

## 2024-01-06 DIAGNOSIS — F410 Panic disorder [episodic paroxysmal anxiety] without agoraphobia: Secondary | ICD-10-CM

## 2024-01-06 DIAGNOSIS — E039 Hypothyroidism, unspecified: Secondary | ICD-10-CM

## 2024-01-06 MED ORDER — BUPROPION HCL ER (XL) 150 MG PO TB24
150.0000 mg | ORAL_TABLET | Freq: Every day | ORAL | 0 refills | Status: DC
  Filled 2024-01-06: qty 30, 30d supply, fill #0

## 2024-01-06 MED ORDER — LEVOTHYROXINE SODIUM 25 MCG PO TABS
25.0000 ug | ORAL_TABLET | Freq: Every day | ORAL | 0 refills | Status: DC
Start: 2024-01-06 — End: 2024-01-20
  Filled 2024-01-06: qty 30, 30d supply, fill #0

## 2024-01-20 ENCOUNTER — Other Ambulatory Visit: Payer: Self-pay

## 2024-01-20 ENCOUNTER — Encounter: Payer: Self-pay | Admitting: Family Medicine

## 2024-01-20 ENCOUNTER — Ambulatory Visit: Payer: Self-pay | Admitting: Family Medicine

## 2024-01-20 VITALS — BP 110/70 | HR 96 | Resp 16 | Ht 69.0 in | Wt 138.8 lb

## 2024-01-20 DIAGNOSIS — J45990 Exercise induced bronchospasm: Secondary | ICD-10-CM | POA: Diagnosis not present

## 2024-01-20 DIAGNOSIS — D500 Iron deficiency anemia secondary to blood loss (chronic): Secondary | ICD-10-CM | POA: Diagnosis not present

## 2024-01-20 DIAGNOSIS — L700 Acne vulgaris: Secondary | ICD-10-CM | POA: Diagnosis not present

## 2024-01-20 DIAGNOSIS — F319 Bipolar disorder, unspecified: Secondary | ICD-10-CM | POA: Diagnosis not present

## 2024-01-20 DIAGNOSIS — E785 Hyperlipidemia, unspecified: Secondary | ICD-10-CM

## 2024-01-20 DIAGNOSIS — E538 Deficiency of other specified B group vitamins: Secondary | ICD-10-CM

## 2024-01-20 DIAGNOSIS — E039 Hypothyroidism, unspecified: Secondary | ICD-10-CM | POA: Diagnosis not present

## 2024-01-20 MED ORDER — LEVOTHYROXINE SODIUM 25 MCG PO TABS
12.5000 ug | ORAL_TABLET | Freq: Every day | ORAL | 0 refills | Status: DC
  Filled 2024-01-20 – 2024-03-04 (×2): qty 45, 90d supply, fill #0

## 2024-01-20 MED ORDER — BUPROPION HCL ER (SR) 100 MG PO TB12
100.0000 mg | ORAL_TABLET | Freq: Two times a day (BID) | ORAL | 1 refills | Status: DC
  Filled 2024-01-20: qty 60, 30d supply, fill #0
  Filled 2024-03-22: qty 60, 30d supply, fill #1

## 2024-01-20 NOTE — Progress Notes (Signed)
 Name: Jean Foster   MRN: [id number]    DOB: December 10, 1985   Date:01/20/2024       Progress Note  Subjective  Chief Complaint  Chief Complaint  Patient presents with   Medical Management of Chronic Issues   Discussed the use of AI scribe software for clinical note transcription with the patient, who gave verbal consent to proceed.  History of Present Illness Jean Foster is a 38 year old female who presents for a regular six-month follow-up.  She recently experienced an episode of strep throat a couple of weeks ago, which has since resolved. She is planning to undergo a physical exam in August, during which she intends to have blood work done.  She has a history of iron  deficiency anemia, currently managed with ferrous sulfate  325 mg once daily, without any reported constipation. Her menstrual cycles are not heavy, and she consumes meat approximately twice a week, with most of her protein intake from eggs, protein shakes, and Greek yogurt. Hemoglobin levels have fluctuated over the years, with the most recent being 11.2. Ferritin level was 14, indicating low iron  storage.   Her B12 levels were mid-range at 415 in August, and she now takes a mineral supplement containing B vitamins. She has been on thyroid medication since after her second child and has recently started halving her dose of levothyroxine  since May 1st. No symptoms of hypothyroidism such as constipation, hair loss, or weight gain are reported.  She has a formal diagnosis of bipolar disorder with a history of more depressive episodes. Currently on Wellbutrin  for depression, Buspar  and hydroxizine prn , which she feels helps with energy. She has experienced mania in the past, which was exacerbated by Abilify . No recent manic episodes are reported.  She also has a history of anxiety and panic attacks, for which she takes Buspar  and hydroxyzine  as needed. No recent panic attacks are reported.   She uses spironolactone  for hormonal  cystic acne, which she reports is effective.  She has exercise-induced asthma and uses albuterol  as needed, though she hasn't used it recently. 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   Patient Active Problem List   Diagnosis Date Noted   Iron  deficiency anemia due to chronic blood loss 05/22/2022   Exercise-induced asthma 11/15/2021   Bipolar disorder with depression (HCC) 04/10/2020   Acquired hypothyroidism 04/30/2019   Panic attack 04/13/2019   Cystic acne 01/01/2019    Past Surgical History:  Procedure Laterality Date   NO PAST SURGERIES     TOOTH EXTRACTION      Family History  Problem Relation Age of Onset   Hypertension Mother    COPD Father    Depression Sister    Depression Sister    Bipolar disorder Maternal Grandmother    Bladder Cancer Maternal Grandfather    Lung cancer Paternal Grandmother    Alcoholism Paternal Grandfather    Cirrhosis Paternal Grandfather    Breast cancer Neg Hx    Ovarian cancer Neg Hx    Colon cancer Neg Hx    Diabetes Neg Hx     Social History   Tobacco Use   Smoking status: Former    Current packs/day: 0.00    Average packs/day: 0.3 packs/day for 12.0 years (3.0 ttl pk-yrs)    Types: Cigarettes    Start date: 06/16/2005    Quit date: 06/16/2017  Years since quitting: 6.6   Smokeless tobacco: Never  Substance Use Topics   Alcohol use: Yes    Alcohol/week: 2.0 - 3.0 standard drinks of alcohol    Types: 2 - 3 Cans of beer per week    Comment: once a week     Current Outpatient Medications:    Adapalene  (DIFFERIN ) 0.3 % gel, Apply a pea sized amount to the entire face before bedtime nightly., Disp: 45 g, Rfl: 11   albuterol  (VENTOLIN  HFA) 108 (90 Base) MCG/ACT inhaler, Inhale 2 puffs into the lungs every 6 (six) hours as needed for wheezing or shortness of breath., Disp: 6.7 g, Rfl: 0   buPROPion  (WELLBUTRIN  XL)  150 MG 24 hr tablet, Take 1 tablet (150 mg total) by mouth daily., Disp: 30 tablet, Rfl: 0   busPIRone  (BUSPAR ) 10 MG tablet, Take 1 tablet (10 mg total) by mouth 2 (two) times daily as needed., Disp: 180 tablet, Rfl: 1   doxycycline  (MONODOX ) 100 MG capsule, Take 1 capsule (100 mg total) by mouth 2 (two) times daily. Take with food and drink (Patient taking differently: Take 100 mg by mouth 2 (two) times daily. Take with food and drink, ONLY PRN ACNE FLAREUP), Disp: 14 capsule, Rfl: 0   doxycycline  (PERIOSTAT ) 20 MG tablet, Take 1 tablet (20 mg total) by mouth 2 (two) times daily. Take with food. (Patient taking differently: Take 20 mg by mouth 2 (two) times daily. Take with food. ONLY PRN ACNE FLAREUP), Disp: 60 tablet, Rfl: 3   hydrOXYzine  (VISTARIL ) 25 MG capsule, Take 1 capsule (25 mg total) by mouth 3 (three) times daily as needed., Disp: 30 capsule, Rfl: 0   levothyroxine  (SYNTHROID ) 25 MCG tablet, Take 1 tablet (25 mcg total) by mouth daily before breakfast., Disp: 30 tablet, Rfl: 0   Multiple Vitamin (MULTIVITAMIN) tablet, Take 1 tablet by mouth daily., Disp: , Rfl:    spironolactone  (ALDACTONE ) 50 MG tablet, Take 2 tablets by mouth every morning and 1 tablet at night as directed., Disp: 90 tablet, Rfl: 11   valACYclovir  (VALTREX ) 1000 MG tablet, Take 1 tablet (1,000 mg total) by mouth 2 (two) times daily., Disp: 10 tablet, Rfl: 0   vitamin B-12 (CYANOCOBALAMIN ) 500 MCG tablet, Take 500 mcg by mouth daily., Disp: , Rfl:    vitamin C (ASCORBIC ACID) 500 MG tablet, Take 500 mg by mouth daily., Disp: , Rfl:    fluconazole  (DIFLUCAN ) 150 MG tablet, Take 1 tablet (150 mg total) by mouth every 3 (three) days as needed (for vaginal itching/yeast infection sx). (Patient not taking: Reported on 01/20/2024), Disp: 2 tablet, Rfl: 0   Iron , Ferrous Sulfate , 325 (65 Fe) MG TABS, Take 1 tablet (325 mg) by mouth once daily. (Patient not taking: Reported on 01/20/2024), Disp: 100 tablet, Rfl: 0  No Known  Allergies  I personally reviewed active problem list, medication list, allergies, family history with the patient/caregiver today.   ROS  Ten systems reviewed and is negative except as mentioned in HPI    Objective Physical Exam CONSTITUTIONAL: Patient appears well-developed and well-nourished. No distress. HEENT: Head atraumatic, normocephalic, neck supple. CARDIOVASCULAR: Normal rate, regular rhythm and normal heart sounds. No murmur heard. No BLE edema. PULMONARY: Effort normal and breath sounds normal. Lungs clear to auscultation. No respiratory distress. ABDOMINAL: There is no tenderness or distention. MUSCULOSKELETAL: Normal gait. Without gross motor or sensory deficit. PSYCHIATRIC: Patient has a normal mood and affect. Behavior is normal. Judgment and thought content normal.  Vitals:  01/20/24 0825  BP: 110/70  Pulse: 96  Resp: 16  SpO2: 97%  Weight: 138 lb 12.8 oz (63 kg)  Height: 5\' 9"  (1.753 m)    Body mass index is 20.5 kg/m.  Recent Results (from the past 2160 hours)  POCT rapid strep A     Status: Abnormal   Collection Time: 12/26/23  2:39 PM  Result Value Ref Range   Rapid Strep A Screen Positive (A) Negative      PHQ2/9:    01/20/2024    8:25 AM 07/11/2023    8:50 AM 05/07/2023   11:13 AM 12/30/2022   10:07 AM 05/22/2022    7:43 AM  Depression screen PHQ 2/9  Decreased Interest 0 0 0 0 0  Down, Depressed, Hopeless 0 0 0 0 0  PHQ - 2 Score 0 0 0 0 0  Altered sleeping  0 0 0 0  Tired, decreased energy  0 0 0 0  Change in appetite  0 0 0 0  Feeling bad or failure about yourself   0 0 0 0  Trouble concentrating  0 0 0 0  Moving slowly or fidgety/restless  0 0 0 0  Suicidal thoughts  0 0 0 0  PHQ-9 Score  0 0 0 0  Difficult doing work/chores  Not difficult at all Not difficult at all      phq 9 is negative  Fall Risk:    01/20/2024    8:20 AM 07/11/2023    8:50 AM 05/07/2023   11:12 AM 12/30/2022    9:39 AM 05/22/2022    7:43 AM  Fall Risk    Falls in the past year? 0 0 0 0 0  Number falls in past yr: 0 0 0  0  Injury with Fall? 0 0 0  0  Risk for fall due to : No Fall Risks No Fall Risks  No Fall Risks No Fall Risks  Follow up Falls prevention discussed;Education provided;Falls evaluation completed Falls prevention discussed;Education provided;Falls evaluation completed  Falls prevention discussed Falls prevention discussed     Assessment & Plan Iron  deficiency anemia Chronic iron  deficiency anemia likely due to insufficient dietary intake. Hemoglobin 11.2 g/dL, ferritin 14 ng/mL indicate low iron  stores. - Continue ferrous sulfate  325 mg once daily. - Incorporate iron -rich foods into diet, including non-meat sources. - Re-evaluate hemoglobin and ferritin levels at next visit.  Bipolar disorder Bipolar disorder with depressive episodes managed with bupropion  SR. She wishes to taper off bupropion . No recent manic episodes. Monitor for mania and depression and contact us  right away - Prescribe bupropion  SR 100 mg twice daily for one month, then reassess. - Monitor mood and energy levels closely during tapering. - Contact provider if depressive symptoms worsen or medication adjustment is needed.  Hypothyroidism adult  Hypothyroidism managed with levothyroxine . She reduced dose to half of 25 mcg tablets since May 1st aiming for discontinuation. Thyroid function tests normal at last check. Aware of symptoms to monitor during dose reduction. - Continue half dose of levothyroxine  until next evaluation.May call in 6 weeks to check TSH is desired - Monitor for symptoms of hypothyroidism such as weight gain, constipation, and hair loss. - Check thyroid function tests in mid-June if symptoms arise or at next visit in August.  Dyslipidemia -continue life style modification  Adult Acne -under the care of Dermatologist and doing well on Spironolactone    Exercise-induced asthma Exercise-induced asthma managed with albuterol  as  needed. No recent use indicates good control. -  Use albuterol  prior to exercise if needed.

## 2024-02-19 ENCOUNTER — Ambulatory Visit
Admission: EM | Admit: 2024-02-19 | Discharge: 2024-02-19 | Disposition: A | Discharge status: Home / Self Care | Attending: Emergency Medicine | Admitting: Emergency Medicine

## 2024-02-19 ENCOUNTER — Other Ambulatory Visit: Payer: Self-pay

## 2024-02-19 ENCOUNTER — Ambulatory Visit: Payer: Self-pay

## 2024-02-19 ENCOUNTER — Encounter: Payer: Self-pay | Admitting: Emergency Medicine

## 2024-02-19 DIAGNOSIS — L03011 Cellulitis of right finger: Secondary | ICD-10-CM | POA: Diagnosis not present

## 2024-02-19 MED ORDER — SULFAMETHOXAZOLE-TRIMETHOPRIM 800-160 MG PO TABS
1.0000 | ORAL_TABLET | Freq: Two times a day (BID) | ORAL | 0 refills | Status: AC
Start: 2024-02-19 — End: 2024-02-26
  Filled 2024-02-19: qty 14, 7d supply, fill #0

## 2024-02-19 NOTE — ED Provider Notes (Signed)
 Jean Foster    CSN: [id number] Arrival date & time: 02/19/24  0801      History   Chief Complaint Chief Complaint  Patient presents with   Finger Injury    HPI Jean Foster is a 38 y.o. female.  Patient presents with pain, redness, swelling of her right index finger at the base of her fingernail x 5 days.  She had a coworker attempt to lance the area this morning but had blood return only.  She has not noted any purulent drainage.  No fever or chills.  The history is provided by the patient and medical records.    Past Medical History:  Diagnosis Date   Acne    Anxiety    Asthma    Depression    Depression, major, in remission (HCC) 07/05/2019   Thyroid disease     Patient Active Problem List   Diagnosis Date Noted   Iron  deficiency anemia due to chronic blood loss 05/22/2022   Exercise-induced asthma 11/15/2021   Bipolar disorder with depression (HCC) 04/10/2020   Acquired hypothyroidism 04/30/2019   Panic attack 04/13/2019   Cystic acne 01/01/2019    Past Surgical History:  Procedure Laterality Date   NO PAST SURGERIES     TOOTH EXTRACTION      OB History     Gravida  2   Para  2   Term  2   Preterm      AB      Living  2      SAB      IAB      Ectopic      Multiple  0   Live Births  2            Home Medications    Prior to Admission medications   Medication Sig Start Date End Date Taking? Authorizing Provider  sulfamethoxazole-trimethoprim (BACTRIM DS) 800-160 MG tablet Take 1 tablet by mouth 2 (two) times daily for 7 days. 02/19/24 02/26/24 Yes Wellington Half, NP  Adapalene  (DIFFERIN ) 0.3 % gel Apply a pea sized amount to the entire face before bedtime nightly. 06/24/23   Harris Liming, MD  albuterol  (VENTOLIN  HFA) 108 (90 Base) MCG/ACT inhaler Inhale 2 puffs into the lungs every 6 (six) hours as needed for wheezing or shortness of breath. 12/30/22   Sowles, Krichna, MD  buPROPion  ER (WELLBUTRIN  SR) 100 MG 12 hr  tablet Take 1 tablet (100 mg total) by mouth 2 (two) times daily. 01/20/24   Sowles, Krichna, MD  busPIRone  (BUSPAR ) 10 MG tablet Take 1 tablet (10 mg total) by mouth 2 (two) times daily as needed. 07/11/23   Sowles, Krichna, MD  hydrOXYzine  (VISTARIL ) 25 MG capsule Take 1 capsule (25 mg total) by mouth 3 (three) times daily as needed. 07/11/23   Sowles, Krichna, MD  Iron , Ferrous Sulfate , 325 (65 Fe) MG TABS Take 1 tablet (325 mg) by mouth once daily. Patient not taking: Reported on 01/20/2024 07/11/23   Sowles, Krichna, MD  levothyroxine  (SYNTHROID ) 25 MCG tablet Take 0.5 tablets (12.5 mcg total) by mouth daily before breakfast. 01/20/24   Sowles, Krichna, MD  Multiple Vitamin (MULTIVITAMIN) tablet Take 1 tablet by mouth daily.    [provider]  spironolactone  (ALDACTONE ) 50 MG tablet Take 2 tablets by mouth every morning and 1 tablet at night as directed. 06/24/23   Harris Liming, MD  valACYclovir  (VALTREX ) 1000 MG tablet Take 1 tablet (1,000 mg total) by mouth 2 (two) times daily.  07/11/23   Sowles, Krichna, MD  vitamin B-12 (CYANOCOBALAMIN ) 500 MCG tablet Take 500 mcg by mouth daily.    [provider]  vitamin C (ASCORBIC ACID) 500 MG tablet Take 500 mg by mouth daily.    [provider]    Family History Family History  Problem Relation Age of Onset   Hypertension Mother    COPD Father    Depression Sister    Depression Sister    Bipolar disorder Maternal Grandmother    Bladder Cancer Maternal Grandfather    Lung cancer Paternal Grandmother    Alcoholism Paternal Grandfather    Cirrhosis Paternal Grandfather    Breast cancer Neg Hx    Ovarian cancer Neg Hx    Colon cancer Neg Hx    Diabetes Neg Hx     Social History Social History   Tobacco Use   Smoking status: Former    Current packs/day: 0.00    Average packs/day: 0.3 packs/day for 12.0 years (3.0 ttl pk-yrs)    Types: Cigarettes    Start date: 06/16/2005    Quit date: 06/16/2017    Years  since quitting: 6.6   Smokeless tobacco: Never  Vaping Use   Vaping status: Never Used  Substance Use Topics   Alcohol use: Yes    Alcohol/week: 2.0 - 3.0 standard drinks of alcohol    Types: 2 - 3 Cans of beer per week    Comment: once a week   Drug use: No    Comment: last used 03/2017     Allergies   Patient has no known allergies.   Review of Systems Review of Systems  Constitutional:  Negative for chills and fever.  Musculoskeletal:  Negative for arthralgias and joint swelling.  Skin:  Positive for color change and wound.  Neurological:  Negative for weakness and numbness.     Physical Exam Triage Vital Signs ED Triage Vitals  Encounter Vitals Group     BP      Systolic BP Percentile      Diastolic BP Percentile      Pulse      Resp      Temp      Temp src      SpO2      Weight      Height      Head Circumference      Peak Flow      Pain Score      Pain Loc      Pain Education      Exclude from Growth Chart    No data found.  Updated Vital Signs BP 118/75 (BP Location: Left Arm)   Pulse (!) 53   Temp 98.2 F (36.8 C) (Oral)   Resp 18   LMP 02/10/2024 (Exact Date)   SpO2 99%   Visual Acuity Right Eye Distance:   Left Eye Distance:   Bilateral Distance:    Right Eye Near:   Left Eye Near:    Bilateral Near:     Physical Exam Constitutional:      General: She is not in acute distress. HENT:     Mouth/Throat:     Mouth: Mucous membranes are moist.  Cardiovascular:     Rate and Rhythm: Normal rate and regular rhythm.  Pulmonary:     Effort: Pulmonary effort is normal. No respiratory distress.  Musculoskeletal:        General: No deformity. Normal range of motion.  Skin:    General:  Skin is warm and dry.     Capillary Refill: Capillary refill takes less than 2 seconds.     Findings: Erythema and lesion present.     Comments: Right index finger: Tender erythematous edematous paronychia at base of her fingernail.  2 mm laceration where  patient attempted to drain the area.  No active bleeding.  No drainage.  Neurological:     General: No focal deficit present.     Mental Status: She is alert.     Sensory: No sensory deficit.     Motor: No weakness.      UC Treatments / Results  Labs (all labs ordered are listed, but only abnormal results are displayed) Labs Reviewed - No data to display  EKG   Radiology No results found.  Procedures Procedures (including critical care time)  Medications Ordered in UC Medications - No data to display  Initial Impression / Assessment and Plan / UC Course  I have reviewed the triage vital signs and the nursing notes.  Pertinent labs & imaging results that were available during my care of the patient were reviewed by me and considered in my medical decision making (see chart for details).    Paronychia of right index finger.  No I&D indicated at this time.  Last tetanus 2019; patient declines update of tetanus today.  Treating with Bactrim.  Warm compresses.  Education provided on paronychia.  Instructed patient to follow up with her PCP if her symptoms are not improving.  She agrees to plan of care.   Final Clinical Impressions(s) / UC Diagnoses   Final diagnoses:  Paronychia of right index finger     Discharge Instructions      Take the Bactrim as directed.  Follow-up with your primary care provider if your symptoms are not improving.    ED Prescriptions     Medication Sig Dispense Auth. Provider   sulfamethoxazole-trimethoprim (BACTRIM DS) 800-160 MG tablet Take 1 tablet by mouth 2 (two) times daily for 7 days. 14 tablet Wellington Half, NP      PDMP not reviewed this encounter.   Wellington Half, NP 02/19/24 [phone]

## 2024-02-19 NOTE — ED Triage Notes (Signed)
 Patient complains of pain, swelling and redness right index finger x 5 days. Tried to lancet at work with no relief.

## 2024-02-19 NOTE — Discharge Instructions (Addendum)
Take the Bactrim as directed.  Follow up with your primary care provider if your symptoms are not improving.    

## 2024-03-04 ENCOUNTER — Other Ambulatory Visit: Payer: Self-pay

## 2024-03-04 ENCOUNTER — Other Ambulatory Visit: Payer: Self-pay | Admitting: Family Medicine

## 2024-03-04 ENCOUNTER — Encounter: Payer: Self-pay | Admitting: Family Medicine

## 2024-03-04 DIAGNOSIS — E039 Hypothyroidism, unspecified: Secondary | ICD-10-CM

## 2024-03-09 DIAGNOSIS — E039 Hypothyroidism, unspecified: Secondary | ICD-10-CM | POA: Diagnosis not present

## 2024-03-10 LAB — CORTISOL: Cortisol, Plasma: 15.3 ug/dL

## 2024-03-10 LAB — TSH: TSH: 7.12 m[IU]/L — ABNORMAL HIGH

## 2024-03-11 ENCOUNTER — Ambulatory Visit: Payer: Self-pay | Admitting: Family Medicine

## 2024-04-21 ENCOUNTER — Other Ambulatory Visit: Payer: Self-pay | Admitting: Family Medicine

## 2024-04-21 DIAGNOSIS — F410 Panic disorder [episodic paroxysmal anxiety] without agoraphobia: Secondary | ICD-10-CM

## 2024-04-21 DIAGNOSIS — E039 Hypothyroidism, unspecified: Secondary | ICD-10-CM

## 2024-04-21 DIAGNOSIS — F319 Bipolar disorder, unspecified: Secondary | ICD-10-CM

## 2024-04-22 ENCOUNTER — Other Ambulatory Visit: Payer: Self-pay

## 2024-04-22 MED ORDER — BUSPIRONE HCL 10 MG PO TABS
10.0000 mg | ORAL_TABLET | Freq: Two times a day (BID) | ORAL | 0 refills | Status: DC | PRN
  Filled 2024-04-22: qty 60, 30d supply, fill #0

## 2024-04-22 MED ORDER — LEVOTHYROXINE SODIUM 25 MCG PO TABS
12.5000 ug | ORAL_TABLET | Freq: Every day | ORAL | 0 refills | Status: DC
Start: 2024-04-22 — End: 2024-05-24
  Filled 2024-04-22: qty 45, 90d supply, fill #0
  Filled 2024-04-23: qty 30, 60d supply, fill #0
  Filled 2024-04-23: qty 45, 90d supply, fill #0

## 2024-04-22 MED ORDER — BUPROPION HCL ER (SR) 100 MG PO TB12
100.0000 mg | ORAL_TABLET | Freq: Two times a day (BID) | ORAL | 1 refills | Status: DC
  Filled 2024-04-22: qty 60, 30d supply, fill #0
  Filled 2024-07-05: qty 60, 30d supply, fill #1

## 2024-04-23 ENCOUNTER — Other Ambulatory Visit: Payer: Self-pay

## 2024-05-12 ENCOUNTER — Emergency Department

## 2024-05-12 ENCOUNTER — Encounter: Payer: Self-pay | Admitting: Family Medicine

## 2024-05-12 ENCOUNTER — Other Ambulatory Visit: Payer: Self-pay

## 2024-05-12 ENCOUNTER — Ambulatory Visit (INDEPENDENT_AMBULATORY_CARE_PROVIDER_SITE_OTHER): Payer: Self-pay | Admitting: Family Medicine

## 2024-05-12 ENCOUNTER — Emergency Department
Admission: EM | Admit: 2024-05-12 | Discharge: 2024-05-13 | Disposition: A | Discharge status: Home / Self Care | Attending: Emergency Medicine | Admitting: Emergency Medicine

## 2024-05-12 VITALS — BP 110/68 | HR 58 | Resp 16 | Ht 68.0 in | Wt 146.0 lb

## 2024-05-12 DIAGNOSIS — E039 Hypothyroidism, unspecified: Secondary | ICD-10-CM | POA: Insufficient documentation

## 2024-05-12 DIAGNOSIS — R0789 Other chest pain: Secondary | ICD-10-CM | POA: Diagnosis not present

## 2024-05-12 DIAGNOSIS — R918 Other nonspecific abnormal finding of lung field: Secondary | ICD-10-CM | POA: Diagnosis not present

## 2024-05-12 DIAGNOSIS — Z0000 Encounter for general adult medical examination without abnormal findings: Secondary | ICD-10-CM

## 2024-05-12 DIAGNOSIS — E538 Deficiency of other specified B group vitamins: Secondary | ICD-10-CM

## 2024-05-12 DIAGNOSIS — D500 Iron deficiency anemia secondary to blood loss (chronic): Secondary | ICD-10-CM | POA: Diagnosis not present

## 2024-05-12 DIAGNOSIS — J45909 Unspecified asthma, uncomplicated: Secondary | ICD-10-CM | POA: Diagnosis not present

## 2024-05-12 DIAGNOSIS — E785 Hyperlipidemia, unspecified: Secondary | ICD-10-CM

## 2024-05-12 DIAGNOSIS — Z79899 Other long term (current) drug therapy: Secondary | ICD-10-CM | POA: Diagnosis not present

## 2024-05-12 LAB — BASIC METABOLIC PANEL WITH GFR
Anion gap: 10 (ref 5–15)
BUN: 21 mg/dL — ABNORMAL HIGH (ref 6–20)
CO2: 25 mmol/L (ref 22–32)
Calcium: 9.9 mg/dL (ref 8.9–10.3)
Chloride: 102 mmol/L (ref 98–111)
Creatinine, Ser: 1.1 mg/dL — ABNORMAL HIGH (ref 0.44–1.00)
GFR, Estimated: >60 mL/min (ref >60)
Glucose, Bld: 104 mg/dL — ABNORMAL HIGH (ref 70–99)
Potassium: 4.1 mmol/L (ref 3.5–5.1)
Sodium: 137 mmol/L (ref 135–145)

## 2024-05-12 LAB — CBC
HCT: 37.2 % (ref 36.0–46.0)
Hemoglobin: 12.1 g/dL (ref 12.0–15.0)
MCH: 28.5 pg (ref 26.0–34.0)
MCHC: 32.5 g/dL (ref 30.0–36.0)
MCV: 87.7 fL (ref 80.0–100.0)
Platelets: 337 K/uL (ref 150–400)
RBC: 4.24 MIL/uL (ref 3.87–5.11)
RDW: 14.6 % (ref 11.5–15.5)
WBC: 9.4 K/uL (ref 4.0–10.5)
nRBC: 0 % (ref 0.0–0.2)

## 2024-05-12 LAB — TROPONIN I (HIGH SENSITIVITY): Troponin I (High Sensitivity): 4 ng/L (ref <18)

## 2024-05-12 MED ORDER — KETOROLAC TROMETHAMINE 30 MG/ML IJ SOLN
30.0000 mg | Freq: Once | INTRAMUSCULAR | Status: AC
  Administered 2024-05-12: 30 mg via INTRAVENOUS
  Filled 2024-05-12: qty 1

## 2024-05-12 MED ORDER — METHOCARBAMOL 1000 MG/10ML IJ SOLN
500.0000 mg | Freq: Once | INTRAMUSCULAR | Status: AC
  Administered 2024-05-13: 500 mg via INTRAVENOUS
  Filled 2024-05-12: qty 5

## 2024-05-12 NOTE — ED Triage Notes (Signed)
 Patient ambulatory to triage with complaints of intermittent chest pain and palpitations. States it began 1 hour PTA with accompanying nausea. Denies cardiac hx but does have hx of anxiety/depression and hypothyroid.

## 2024-05-12 NOTE — ED Provider Notes (Signed)
 [facility] Provider Note    Event Date/Time   First MD Initiated Contact with Patient 05/12/24 2257     (approximate)   History   Chest Pain   HPI  Jean Foster is a 38 y.o. female with history of depression, anxiety, asthma, hypothyroidism who presents to the emergency department with chest discomfort. Chest pressure since 8pm No SOB No V, + nausea No fever, cough No history of PE, DVT, exogenous estrogen use, recent fractures, surgery, trauma, hospitalization, prolonged travel or other immobilization. No lower extremity swelling or pain. No calf tenderness. No tobacco use Has had weeks of left shoulder pain without injury.  Able to fully range the shoulder.    History provided by patient, significant other.    Past Medical History:  Diagnosis Date   Acne    Anxiety    Asthma    Depression    Depression, major, in remission (HCC) 07/05/2019   Thyroid  disease     Past Surgical History:  Procedure Laterality Date   NO PAST SURGERIES     TOOTH EXTRACTION      MEDICATIONS:  Prior to Admission medications   Medication Sig Start Date End Date Taking? Authorizing Provider  Adapalene  (DIFFERIN ) 0.3 % gel Apply a pea sized amount to the entire face before bedtime nightly. 06/24/23   Claudene Lehmann, MD  albuterol  (VENTOLIN  HFA) 108 (90 Base) MCG/ACT inhaler Inhale 2 puffs into the lungs every 6 (six) hours as needed for wheezing or shortness of breath. 12/30/22   Sowles, Krichna, MD  buPROPion  ER (WELLBUTRIN  SR) 100 MG 12 hr tablet Take 1 tablet (100 mg total) by mouth 2 (two) times daily. 04/22/24   Bernardo Fend, DO  busPIRone  (BUSPAR ) 10 MG tablet Take 1 tablet (10 mg total) by mouth 2 (two) times daily as needed. 04/22/24   Sowles, Krichna, MD  hydrOXYzine  (VISTARIL ) 25 MG capsule Take 1 capsule (25 mg total) by mouth 3 (three) times daily as needed. 07/11/23   Sowles, Krichna, MD  Iron , Ferrous Sulfate , 325 (65 Fe) MG TABS Take 1  tablet (325 mg) by mouth once daily. 07/11/23   Sowles, Krichna, MD  levothyroxine  (SYNTHROID ) 25 MCG tablet Take 0.5 tablets (12.5 mcg total) by mouth daily before breakfast. 04/22/24   Bernardo Fend, DO  Multiple Vitamin (MULTIVITAMIN) tablet Take 1 tablet by mouth daily.    [provider]  spironolactone  (ALDACTONE ) 50 MG tablet Take 2 tablets by mouth every morning and 1 tablet at night as directed. 06/24/23   Claudene Lehmann, MD  valACYclovir  (VALTREX ) 1000 MG tablet Take 1 tablet (1,000 mg total) by mouth 2 (two) times daily. 07/11/23   Sowles, Krichna, MD  vitamin B-12 (CYANOCOBALAMIN ) 500 MCG tablet Take 500 mcg by mouth daily.    [provider]  vitamin C (ASCORBIC ACID) 500 MG tablet Take 500 mg by mouth daily.    [provider]    Physical Exam   Triage Vital Signs: ED Triage Vitals  Encounter Vitals Group     BP 05/12/24 2139 (!) 151/74     Girls Systolic BP Percentile --      Girls Diastolic BP Percentile --      Boys Systolic BP Percentile --      Boys Diastolic BP Percentile --      Pulse Rate 05/12/24 2139 85     Resp 05/12/24 2139 16     Temp 05/12/24 2139 98.1 F (36.7 C)     Temp  src --      SpO2 05/12/24 2139 99 %     Weight 05/12/24 2137 145 lb (65.8 kg)     Height 05/12/24 2137 5' 8 (1.727 m)     Head Circumference --      Peak Flow --      Pain Score 05/12/24 2136 5     Pain Loc --      Pain Education --      Exclude from Growth Chart --     Most recent vital signs: Vitals:   05/13/24 0130 05/13/24 0152  BP: 111/76   Pulse: 62   Resp: 16   Temp:  98.1 F (36.7 C)  SpO2: 100%     CONSTITUTIONAL: Alert, responds appropriately to questions. Well-appearing; well-nourished HEAD: Normocephalic, atraumatic EYES: Conjunctivae clear, pupils appear equal, sclera nonicteric ENT: normal nose; moist mucous membranes NECK: Supple, normal ROM CARD: RRR; S1 and S2 appreciated, tender to palpation over the left chest wall  without crepitus or deformity RESP: Normal chest excursion without splinting or tachypnea; breath sounds clear and equal bilaterally; no wheezes, no rhonchi, no rales, no hypoxia or respiratory distress, speaking full sentences ABD/GI: Non-distended; soft, non-tender, no rebound, no guarding, no peritoneal signs BACK: The back appears normal EXT: Normal ROM in all joints; no deformity noted, no edema, no calf tenderness or calf swelling SKIN: Normal color for age and race; warm; no rash on exposed skin NEURO: Moves all extremities equally, normal speech PSYCH: The patient's mood and manner are appropriate.   ED Results / Procedures / Treatments   LABS: (all labs ordered are listed, but only abnormal results are displayed) Labs Reviewed  BASIC METABOLIC PANEL WITH GFR - Abnormal; Notable for the following components:      Result Value   Glucose, Bld 104 (*)    BUN 21 (*)    Creatinine, Ser 1.10 (*)    All other components within normal limits  CBC  HCG, QUANTITATIVE, PREGNANCY  TROPONIN I (HIGH SENSITIVITY)  TROPONIN I (HIGH SENSITIVITY)     EKG:  EKG Interpretation Date/Time:  Wednesday May 12 2024 21:38:15 EDT Ventricular Rate:  91 PR Interval:  152 QRS Duration:  78 QT Interval:  342 QTC Calculation: 420 R Axis:   80  Text Interpretation: Normal sinus rhythm Biatrial enlargement Abnormal ECG When compared with ECG of 10-Nov-2023 11:54, (unconfirmed) No significant change was found Confirmed by Neomi Neptune [phone]) on 05/12/2024 11:31:43 PM         RADIOLOGY: My personal review and interpretation of imaging: Chest x-ray clear.  I have personally reviewed all radiology reports.   DG Chest 1 View Result Date: 05/12/2024 CLINICAL DATA:  355200 Chest pain 355200 EXAM: CHEST  1 VIEW COMPARISON:  Chest x-ray 11/10/2023 FINDINGS: The heart and mediastinal contours are within normal limits. Incidentally noted azygous fissure. No focal consolidation. No pulmonary edema.  Nonspecific right costophrenic angle blunting. No significant pleural effusion. No pneumothorax. No acute osseous abnormality. IMPRESSION: Nonspecific right costophrenic angle blunting. Possible trace right pleural effusion. Electronically Signed   By: Morgane  Naveau M.D.   On: 05/12/2024 21:56     PROCEDURES:  Critical Care performed: No     .1-3 Lead EKG Interpretation  Performed by: Plummer Matich, Neptune SAILOR, DO Authorized by: Jouri Threat, Neptune SAILOR, DO     Interpretation: normal     ECG rate:  62   ECG rate assessment: normal     Rhythm: sinus rhythm     Ectopy: none  Conduction: normal       IMPRESSION / MDM / ASSESSMENT AND PLAN / ED COURSE  I reviewed the triage vital signs and the nursing notes.    Patient here for atypical chest pain.  Suspect musculoskeletal in nature.  The patient is on the cardiac monitor to evaluate for evidence of arrhythmia and/or significant heart rate changes.   DIFFERENTIAL DIAGNOSIS (includes but not limited to):   Chest wall pain, doubt ACS, PE, dissection, pneumonia, pneumothorax, CHF, pericarditis, myocarditis   Patient's presentation is most consistent with acute presentation with potential threat to life or bodily function.   PLAN: First troponin negative.  Second pending.  Patient is PERC negative.  Normal hemoglobin, electrolytes.  Chest x-ray reviewed and interpreted by myself and the radiologist as unremarkable.  EKG shows no ischemia.  Will give Robaxin , Toradol  and reassess.   MEDICATIONS GIVEN IN ED: Medications  ketorolac  (TORADOL ) 30 MG/ML injection 30 mg (30 mg Intravenous Given 05/12/24 2357)  methocarbamol  (ROBAXIN ) injection 500 mg (500 mg Intravenous Given 05/13/24 0003)     ED COURSE: Second troponin negative.  Reports some improvement but still having some discomfort.  Will give morphine .   Patient reports feeling much better.  Will discharge with prescriptions for Robaxin .  Recommended Tylenol , Motrin  as needed.   At  this time, I do not feel there is any life-threatening condition present. I reviewed all nursing notes, vitals, pertinent previous records.  All lab and urine results, EKGs, imaging ordered have been independently reviewed and interpreted by myself.  I reviewed all available radiology reports from any imaging ordered this visit.  Based on my assessment, I feel the patient is safe to be discharged home without further emergent workup and can continue workup as an outpatient as needed. Discussed all findings, treatment plan as well as usual and customary return precautions.  They verbalize understanding and are comfortable with this plan.  Outpatient follow-up has been provided as needed.  All questions have been answered.    CONSULTS:  none   OUTSIDE RECORDS REVIEWED: Reviewed in recent internal medicine notes.       FINAL CLINICAL IMPRESSION(S) / ED DIAGNOSES   Final diagnoses:  Chest wall pain     Rx / DC Orders   ED Discharge Orders          Ordered    methocarbamol  (ROBAXIN ) 500 MG tablet  Every 8 hours PRN        05/13/24 0022             Note:  This document was prepared using Dragon voice recognition software and may include unintentional dictation errors.   Fannye Myer, Josette SAILOR, DO 05/13/24 [phone]

## 2024-05-12 NOTE — Patient Instructions (Signed)

## 2024-05-12 NOTE — Progress Notes (Signed)
 Name: Jean Foster   MRN: [id number]    DOB: 18-Feb-1986   Date:05/12/2024       Progress Note  Subjective  Chief Complaint  Chief Complaint  Patient presents with   Annual Exam    HPI  Patient presents for annual CPE.  Diet: she eats a balanced diet , whole foods only  Exercise: she is doing piliates and also lifting weights - three days a week , also walks daily  Last Eye Exam: completed Last Dental Exam: completed  Flowsheet Row Office Visit from 05/12/2024 in [facility]  AUDIT-C Score 0   Depression: Phq 9 is  negative    05/12/2024    8:04 AM 01/20/2024    8:25 AM 07/11/2023    8:50 AM 05/07/2023   11:13 AM 12/30/2022   10:07 AM  Depression screen PHQ 2/9  Decreased Interest 0 0 0 0 0  Down, Depressed, Hopeless 0 0 0 0 0  PHQ - 2 Score 0 0 0 0 0  Altered sleeping 0  0 0 0  Tired, decreased energy 0  0 0 0  Change in appetite 0  0 0 0  Feeling bad or failure about yourself  0  0 0 0  Trouble concentrating 0  0 0 0  Moving slowly or fidgety/restless 0  0 0 0  Suicidal thoughts 0  0 0 0  PHQ-9 Score 0  0 0 0  Difficult doing work/chores Not difficult at all  Not difficult at all Not difficult at all    Hypertension: BP Readings from Last 3 Encounters:  05/12/24 110/68  02/19/24 118/75  01/20/24 110/70   Obesity: Wt Readings from Last 3 Encounters:  05/12/24 146 lb (66.2 kg)  01/20/24 138 lb 12.8 oz (63 kg)  07/11/23 131 lb 4.8 oz (59.6 kg)   BMI Readings from Last 3 Encounters:  05/12/24 22.20 kg/m  01/20/24 20.50 kg/m  07/11/23 19.39 kg/m     Vaccines: reviewed with the patient.   Hep C Screening: completed STD testing and prevention (HIV/chl/gon/syphilis): N/A Intimate partner violence: negative screen  Sexual History :no pain  Menstrual History/LMP/Abnormal Bleeding: regular cycles, husband had vasectomy  Discussed importance of follow up if any post-menopausal bleeding: not applicable  Incontinence Symptoms:  negative for symptoms   Breast cancer:  - Last Mammogram: N/A - BRCA gene screening: N/A  Osteoporosis Prevention : Discussed high calcium and vitamin D supplementation, weight bearing exercises Bone density :not applicable   Cervical cancer screening: up-to-date  Skin cancer: Discussed monitoring for atypical lesions  Colorectal cancer: N/A   Lung cancer:  Low Dose CT Chest recommended if Age 80-80 years, 20 pack-year currently smoking OR have quit w/in 15years. Patient does not qualify for screen   ECG: 2025  Advanced Care Planning: A voluntary discussion about advance care planning including the explanation and discussion of advance directives.  Discussed health care proxy and Living will, and the patient was able to identify a health care proxy as mother.  Patient does not have a living will and power of attorney of health care   Patient Active Problem List   Diagnosis Date Noted   Iron  deficiency anemia due to chronic blood loss 05/22/2022   Exercise-induced asthma 11/15/2021   Bipolar disorder with depression (HCC) 04/10/2020   Acquired hypothyroidism 04/30/2019   Panic attack 04/13/2019   Cystic acne 01/01/2019    Past Surgical History:  Procedure Laterality Date   NO PAST SURGERIES  TOOTH EXTRACTION      Family History  Problem Relation Age of Onset   Hypertension Mother    COPD Father    Depression Sister    Depression Sister    Bipolar disorder Maternal Grandmother    Bladder Cancer Maternal Grandfather    Lung cancer Paternal Grandmother    Alcoholism Paternal Grandfather    Cirrhosis Paternal Grandfather    Breast cancer Neg Hx    Ovarian cancer Neg Hx    Colon cancer Neg Hx    Diabetes Neg Hx     Social History   Socioeconomic History   Marital status: Divorced    Spouse name: Not on file   Number of children: 2   Years of education: 17   Highest education level: Bachelor's degree (e.g., BA, AB, BS)  Occupational History   Occupation: [employer]  Tobacco Use   Smoking status: Former    Current packs/day: 0.00    Average packs/day: 0.3 packs/day for 12.0 years (3.0 ttl pk-yrs)    Types: Cigarettes    Start date: 06/16/2005    Quit date: 06/16/2017    Years since quitting: 6.9   Smokeless tobacco: Never  Vaping Use   Vaping status: Never Used  Substance and Sexual Activity   Alcohol use: Not Currently    Alcohol/week: 2.0 - 3.0 standard drinks of alcohol    Types: 2 - 3 Cans of beer per week    Comment: once a week   Drug use: No    Comment: last used 03/2017   Sexual activity: Yes    Partners: Male    Birth control/protection: None  Other Topics Concern   Not on file  Social History Narrative   Lives with partner and their two sons   Social Drivers of Corporate investment banker Strain: Low Risk  (05/12/2024)   Overall Financial Resource Strain (CARDIA)    Difficulty of Paying Living Expenses: Not hard at all  Food Insecurity: No Food Insecurity (05/12/2024)   Hunger Vital Sign    Worried About Running Out of Food in the Last Year: Never true    Ran Out of Food in the Last Year: Never true  Transportation Needs: No Transportation Needs (05/12/2024)   PRAPARE - Administrator, Civil Service (Medical): No    Lack of Transportation (Non-Medical): No  Physical Activity: Sufficiently Active (05/12/2024)   Exercise Vital Sign    Days of Exercise per Week: 7 days    Minutes of Exercise per Session: 60 min  Stress: No Stress Concern Present (05/12/2024)   [employer] of Occupational Health - Occupational Stress Questionnaire    Feeling of Stress: Only a little  Social Connections: Socially Isolated (05/12/2024)   Social Connection and Isolation Panel    Frequency of Communication with Friends and Family: More than three times a week    Frequency of Social Gatherings with Friends and Family: More than three times a week    Attends Religious Services: Never    Database administrator or Organizations:  No    Attends Banker Meetings: Never    Marital Status: Divorced  Catering manager Violence: Not At Risk (05/12/2024)   Humiliation, Afraid, Rape, and Kick questionnaire    Fear of Current or Ex-Partner: No    Emotionally Abused: No    Physically Abused: No    Sexually Abused: No     Current Outpatient Medications:    Adapalene  (DIFFERIN ) 0.3 %  gel, Apply a pea sized amount to the entire face before bedtime nightly., Disp: 45 g, Rfl: 11   albuterol  (VENTOLIN  HFA) 108 (90 Base) MCG/ACT inhaler, Inhale 2 puffs into the lungs every 6 (six) hours as needed for wheezing or shortness of breath., Disp: 6.7 g, Rfl: 0   buPROPion  ER (WELLBUTRIN  SR) 100 MG 12 hr tablet, Take 1 tablet (100 mg total) by mouth 2 (two) times daily., Disp: 60 tablet, Rfl: 1   busPIRone  (BUSPAR ) 10 MG tablet, Take 1 tablet (10 mg total) by mouth 2 (two) times daily as needed., Disp: 60 tablet, Rfl: 0   hydrOXYzine  (VISTARIL ) 25 MG capsule, Take 1 capsule (25 mg total) by mouth 3 (three) times daily as needed., Disp: 30 capsule, Rfl: 0   Iron , Ferrous Sulfate , 325 (65 Fe) MG TABS, Take 1 tablet (325 mg) by mouth once daily., Disp: 100 tablet, Rfl: 0   levothyroxine  (SYNTHROID ) 25 MCG tablet, Take 0.5 tablets (12.5 mcg total) by mouth daily before breakfast., Disp: 45 tablet, Rfl: 0   Multiple Vitamin (MULTIVITAMIN) tablet, Take 1 tablet by mouth daily., Disp: , Rfl:    spironolactone  (ALDACTONE ) 50 MG tablet, Take 2 tablets by mouth every morning and 1 tablet at night as directed., Disp: 90 tablet, Rfl: 11   valACYclovir  (VALTREX ) 1000 MG tablet, Take 1 tablet (1,000 mg total) by mouth 2 (two) times daily., Disp: 10 tablet, Rfl: 0   vitamin B-12 (CYANOCOBALAMIN ) 500 MCG tablet, Take 500 mcg by mouth daily., Disp: , Rfl:    vitamin C (ASCORBIC ACID) 500 MG tablet, Take 500 mg by mouth daily., Disp: , Rfl:   No Known Allergies   ROS  Constitutional: Negative for fever or weight change.  Respiratory:  Negative for cough and shortness of breath.   Cardiovascular: Negative for chest pain , she has noticed palpitations and some anxiety since she adjusted dose of levothyroxine  Gastrointestinal: Negative for abdominal pain, no bowel changes.  Musculoskeletal: Negative for gait problem or joint swelling.  Skin: Negative for rash.  Neurological: Negative for dizziness or headache.  No other specific complaints in a complete review of systems (except as listed in HPI above).   Objective  Vitals:   05/12/24 0808  BP: 110/68  Pulse: (!) 58  Resp: 16  SpO2: 99%  Weight: 146 lb (66.2 kg)  Height: 5' 8 (1.727 m)    Body mass index is 22.2 kg/m.  Physical Exam  Constitutional: Patient appears well-developed and well-nourished. No distress.  HENT: Head: Normocephalic and atraumatic. Ears: B TMs ok, no erythema or effusion; Nose: Nose normal. Mouth/Throat: Oropharynx is clear and moist. No oropharyngeal exudate.  Eyes: Conjunctivae and EOM are normal. Pupils are equal, round, and reactive to light. No scleral icterus.  Neck: Normal range of motion. Neck supple. No JVD present. No thyromegaly present.  Cardiovascular: Normal rate, regular rhythm and normal heart sounds.  No murmur heard. No BLE edema. Pulmonary/Chest: Effort normal and breath sounds normal. No respiratory distress. Abdominal: Soft. Bowel sounds are normal, no distension. There is no tenderness. no masses Breast: no lumps or masses, no nipple discharge or rashes FEMALE GENITALIA:  Not done  RECTAL: not done  Musculoskeletal: Normal range of motion, no joint effusions. No gross deformities Neurological: he is alert and oriented to person, place, and time. No cranial nerve deficit. Coordination, balance, strength, speech and gait are normal.  Skin: Skin is warm and dry. No rash noted. No erythema.  Psychiatric: Patient has a normal mood  and affect. behavior is normal. Judgment and thought content normal.     Assessment &  Plan  1. Well adult exam (Primary)  - Comprehensive metabolic panel with GFR - CBC with Differential/Platelet - Lipid panel - TSH - B12 and Folate Panel - Iron , TIBC and Ferritin Panel  2. Acquired hypothyroidism  - TSH  3. Dyslipidemia  - Lipid panel  4. Iron  deficiency anemia due to chronic blood loss  - Iron , TIBC and Ferritin Panel  5. B12 deficiency  - B12 and Folate Panel  6. Long-term use of high-risk medication  - Comprehensive metabolic panel with GFR - CBC with Differential/Platelet   -USPSTF grade A and B recommendations reviewed with patient; age-appropriate recommendations, preventive care, screening tests, etc discussed and encouraged; healthy living encouraged; see AVS for patient education given to patient -Discussed importance of 150 minutes of physical activity weekly, eat two servings of fish weekly, eat one serving of tree nuts ( cashews, pistachios, pecans, almonds.SABRA) every other day, eat 6 servings of fruit/vegetables daily and drink plenty of water and avoid sweet beverages.   -Reviewed Health Maintenance: Yes.

## 2024-05-13 LAB — CBC WITH DIFFERENTIAL/PLATELET
Absolute Lymphocytes: 1773 {cells}/uL (ref 850–3900)
Absolute Monocytes: 634 {cells}/uL (ref 200–950)
Basophils Absolute: 83 {cells}/uL (ref 0–200)
Basophils Relative: 1.3 %
Eosinophils Absolute: 51 {cells}/uL (ref 15–500)
Eosinophils Relative: 0.8 %
HCT: 37.7 % (ref 35.0–45.0)
Hemoglobin: 12.2 g/dL (ref 11.7–15.5)
MCH: 29 pg (ref 27.0–33.0)
MCHC: 32.4 g/dL (ref 32.0–36.0)
MCV: 89.5 fL (ref 80.0–100.0)
MPV: 9.9 fL (ref 7.5–12.5)
Monocytes Relative: 9.9 %
Neutro Abs: 3859 {cells}/uL (ref 1500–7800)
Neutrophils Relative %: 60.3 %
Platelets: 340 Thousand/uL (ref 140–400)
RBC: 4.21 Million/uL (ref 3.80–5.10)
RDW: 14.4 % (ref 11.0–15.0)
Total Lymphocyte: 27.7 %
WBC: 6.4 Thousand/uL (ref 3.8–10.8)

## 2024-05-13 LAB — LIPID PANEL
Cholesterol: 199 mg/dL (ref <200)
HDL: 81 mg/dL (ref >=50)
LDL Cholesterol (Calc): 104 mg/dL — ABNORMAL HIGH
Non-HDL Cholesterol (Calc): 118 mg/dL (ref <130)
Total CHOL/HDL Ratio: 2.5 (calc) (ref <5.0)
Triglycerides: 62 mg/dL (ref <150)

## 2024-05-13 LAB — COMPREHENSIVE METABOLIC PANEL WITH GFR
AG Ratio: 1.6 (calc) (ref 1.0–2.5)
ALT: 11 U/L (ref 6–29)
AST: 16 U/L (ref 10–30)
Albumin: 4.4 g/dL (ref 3.6–5.1)
Alkaline phosphatase (APISO): 34 U/L (ref 31–125)
BUN/Creatinine Ratio: 15 (calc) (ref 6–22)
BUN: 17 mg/dL (ref 7–25)
CO2: 29 mmol/L (ref 20–32)
Calcium: 9.7 mg/dL (ref 8.6–10.2)
Chloride: 101 mmol/L (ref 98–110)
Creat: 1.1 mg/dL — ABNORMAL HIGH (ref 0.50–0.97)
Globulin: 2.7 g/dL (ref 1.9–3.7)
Glucose, Bld: 70 mg/dL (ref 65–99)
Potassium: 4.5 mmol/L (ref 3.5–5.3)
Sodium: 137 mmol/L (ref 135–146)
Total Bilirubin: 0.3 mg/dL (ref 0.2–1.2)
Total Protein: 7.1 g/dL (ref 6.1–8.1)
eGFR: 66 mL/min/1.73m2 (ref >=60)

## 2024-05-13 LAB — IRON,TIBC AND FERRITIN PANEL
%SAT: 8 % — ABNORMAL LOW (ref 16–45)
Ferritin: 12 ng/mL — ABNORMAL LOW (ref 16–154)
Iron: 29 ug/dL — ABNORMAL LOW (ref 40–190)
TIBC: 366 ug/dL (ref 250–450)

## 2024-05-13 LAB — B12 AND FOLATE PANEL
Folate: 19.7 ng/mL
Vitamin B-12: 590 pg/mL (ref 200–1100)

## 2024-05-13 LAB — HCG, QUANTITATIVE, PREGNANCY: hCG, Beta Chain, Quant, S: 1 m[IU]/mL (ref <5)

## 2024-05-13 LAB — TROPONIN I (HIGH SENSITIVITY): Troponin I (High Sensitivity): 3 ng/L (ref <18)

## 2024-05-13 LAB — TSH: TSH: 4.76 m[IU]/L — ABNORMAL HIGH

## 2024-05-13 MED ORDER — MORPHINE SULFATE (PF) 4 MG/ML IV SOLN
INTRAVENOUS | Status: AC
  Filled 2024-05-13: qty 1

## 2024-05-13 MED ORDER — METHOCARBAMOL 500 MG PO TABS: 500.0000 mg | ORAL_TABLET | Freq: Three times a day (TID) | ORAL | 0 refills | Status: DC | PRN

## 2024-05-13 NOTE — Discharge Instructions (Signed)
 You may alternate over the counter Tylenol 1000 mg every 6 hours as needed for pain, fever and Ibuprofen 800 mg every 6-8 hours as needed for pain, fever.  Please take Ibuprofen with food.  Do not take more than 4000 mg of Tylenol (acetaminophen) in a 24 hour period.

## 2024-05-14 ENCOUNTER — Other Ambulatory Visit: Payer: Self-pay

## 2024-05-14 MED ORDER — METHOCARBAMOL 500 MG PO TABS
500.0000 mg | ORAL_TABLET | Freq: Three times a day (TID) | ORAL | 0 refills | Status: DC | PRN
  Filled 2024-05-14: qty 15, 5d supply, fill #0

## 2024-05-15 ENCOUNTER — Ambulatory Visit: Payer: Self-pay | Admitting: Family Medicine

## 2024-05-20 ENCOUNTER — Other Ambulatory Visit: Payer: Self-pay | Admitting: Family Medicine

## 2024-05-20 DIAGNOSIS — R9431 Abnormal electrocardiogram [ECG] [EKG]: Secondary | ICD-10-CM

## 2024-05-23 DIAGNOSIS — R9431 Abnormal electrocardiogram [ECG] [EKG]: Secondary | ICD-10-CM | POA: Insufficient documentation

## 2024-05-23 NOTE — Progress Notes (Unsigned)
 Cardiology Office Note  Date:  05/24/2024   ID:  Jean Foster, DOB [birth year], MRN [id number]  PCP:  Sowles, Krichna, MD   Chief Complaint  Patient presents with   New Patient (Initial Visit)    [facility] follow up chest pain & abnormal EKG. Patient c/o intermittent chest pain & palpitations.     HPI:  Jean Foster a 38 y.o. femalewith past medical history of: Depression/anxiety Asthma Hypothyroidism Who presents by referral from Dr. Glenard for consultation of her abnormal EKG  Reports developing atypical chest pain starting 1 year ago Very active at baseline, exercises, lifting weights, yoga  Pain typically starts in her scapular area on the left radiating into left axilla and into her left chest Scapular area tender on palpation when husband does massage in the area Pain symptoms can last up to 1 hour Typically when she has symptoms, she takes her anxiety medications and goes to sleep  Seen in the ER May 12, 2024 chest pain W/u negative  EKG personally reviewed by myself on todays visit EKG Interpretation Date/Time:  Monday May 24 2024 15:49:37 EDT Ventricular Rate:  70 PR Interval:  158 QRS Duration:  74 QT Interval:  366 QTC Calculation: 395 R Axis:   66  Text Interpretation: Normal sinus rhythm When compared with ECG of 12-May-2024 21:38, No significant change was found Reconfirmed by Perla Lye [phone]) on 05/24/2024 4:29:32 PM    PMH:   has a past medical history of Acne, Anxiety, Asthma, Depression, Depression, major, in remission (HCC) (07/05/2019), and Thyroid  disease.  PSH:    Past Surgical History:  Procedure Laterality Date   NO PAST SURGERIES     TOOTH EXTRACTION      Current Outpatient Medications  Medication Sig Dispense Refill   Adapalene  (DIFFERIN ) 0.3 % gel Apply a pea sized amount to the entire face before bedtime nightly. 45 g 11   albuterol  (VENTOLIN  HFA) 108 (90 Base) MCG/ACT inhaler Inhale 2 puffs into the lungs every 6 (six)  hours as needed for wheezing or shortness of breath. 6.7 g 0   buPROPion  ER (WELLBUTRIN  SR) 100 MG 12 hr tablet Take 1 tablet (100 mg total) by mouth 2 (two) times daily. 60 tablet 1   busPIRone  (BUSPAR ) 10 MG tablet Take 1 tablet (10 mg total) by mouth 2 (two) times daily as needed. 60 tablet 0   hydrOXYzine  (VISTARIL ) 25 MG capsule Take 1 capsule (25 mg total) by mouth 3 (three) times daily as needed. 30 capsule 0   Iron , Ferrous Sulfate , 325 (65 Fe) MG TABS Take 1 tablet (325 mg) by mouth once daily. 100 tablet 0   levothyroxine  (SYNTHROID ) 25 MCG tablet Take 25 mcg by mouth daily before breakfast.     Multiple Vitamin (MULTIVITAMIN) tablet Take 1 tablet by mouth daily.     spironolactone  (ALDACTONE ) 50 MG tablet Take 2 tablets by mouth every morning and 1 tablet at night as directed. 90 tablet 11   valACYclovir  (VALTREX ) 1000 MG tablet Take 1 tablet (1,000 mg total) by mouth 2 (two) times daily. 10 tablet 0   vitamin B-12 (CYANOCOBALAMIN ) 500 MCG tablet Take 500 mcg by mouth daily.     vitamin C (ASCORBIC ACID) 500 MG tablet Take 500 mg by mouth daily.     No current facility-administered medications for this visit.   Allergies:   Patient has no known allergies.   Social History:  The patient  reports that she quit smoking about 6 years ago. Her smoking  use included cigarettes. She started smoking about 18 years ago. She has a 3 pack-year smoking history. She has never used smokeless tobacco. She reports that she does not currently use alcohol after a past usage of about 2.0 - 3.0 standard drinks of alcohol per week. She reports that she does not use drugs.   Family History:   family history includes Alcoholism in her paternal grandfather; Bipolar disorder in her maternal grandmother; Bladder Cancer in her maternal grandfather; COPD in her father; Cirrhosis in her paternal grandfather; Depression in her sister and sister; Hypertension in her mother; Lung cancer in her paternal grandmother.     Review of Systems: Review of Systems  Constitutional: Negative.   HENT: Negative.    Respiratory: Negative.    Cardiovascular: Negative.   Gastrointestinal: Negative.   Musculoskeletal: Negative.   Neurological: Negative.   Psychiatric/Behavioral: Negative.    All other systems reviewed and are negative.  PHYSICAL EXAM: VS:  BP 116/78 (BP Location: Right Arm, Patient Position: Sitting, Cuff Size: Normal)   Pulse 70   Ht 5' 9 (1.753 m)   Wt 147 lb 4 oz (66.8 kg)   LMP 04/28/2024 (Exact Date)   SpO2 98%   BMI 21.75 kg/m  , BMI Body mass index is 21.75 kg/m. GEN: Well nourished, well developed, in no acute distress HEENT: normal Neck: no JVD, carotid bruits, or masses Cardiac: RRR; no murmurs, rubs, or gallops,no edema  Respiratory:  clear to auscultation bilaterally, normal work of breathing GI: soft, nontender, nondistended, + BS MS: no deformity or atrophy Skin: warm and dry, no rash Neuro:  Strength and sensation are intact Psych: euthymic mood, full affect  Recent Labs: 05/12/2024: ALT 11; BUN 21; Creatinine, Ser 1.10; Hemoglobin 12.1; Platelets 337; Potassium 4.1; Sodium 137; TSH 4.76    Lipid Panel Lab Results  Component Value Date   CHOL 199 05/12/2024   HDL 81 05/12/2024   LDLCALC 104 (H) 05/12/2024   TRIG 62 05/12/2024     Wt Readings from Last 3 Encounters:  05/24/24 147 lb 4 oz (66.8 kg)  05/12/24 145 lb (65.8 kg)  05/12/24 146 lb (66.2 kg)     ASSESSMENT AND PLAN:  Problem List Items Addressed This Visit     Abnormal EKG - Primary   Relevant Orders   EKG 12-Lead (Completed)   Other Visit Diagnoses       Chest pain of uncertain etiology       Relevant Orders   EKG 12-Lead (Completed)      Atypical chest pain Likely musculoskeletal in etiology, rib discomfort Starting in left scapular area radiating into left axilla and into the left chest.  Tenderness on palpation of left scapular area by husband in the past -Very active at  baseline, symptoms do not present with exertion - Few risk factors for cardiac disease, non-smoker, no diabetes, cholesterol is not elevated - Recommend management of symptoms with ice packs, NSAIDs when she has a flare - Consider chiropractic care  No change to medications  Seen in consultation for Dr. Glenard and will be referred back to her office for ongoing care of the issues detailed above   Signed, Velinda Lunger, M.D., Ph.D. [facility] [place], [place] [phone]

## 2024-05-24 ENCOUNTER — Ambulatory Visit: Attending: Cardiovascular Disease | Admitting: Cardiovascular Disease

## 2024-05-24 ENCOUNTER — Encounter: Payer: Self-pay | Admitting: Cardiovascular Disease

## 2024-05-24 VITALS — BP 116/78 | HR 70 | Ht 69.0 in | Wt 147.2 lb

## 2024-05-24 DIAGNOSIS — R9431 Abnormal electrocardiogram [ECG] [EKG]: Secondary | ICD-10-CM

## 2024-05-24 DIAGNOSIS — R079 Chest pain, unspecified: Secondary | ICD-10-CM | POA: Diagnosis not present

## 2024-05-24 NOTE — Patient Instructions (Addendum)
 Nat Lemmings: chiropractor [phone]  Medication Instructions:  No changes  If you need a refill on your cardiac medications before your next appointment, please call your pharmacy.   Lab work: No new labs needed  Testing/Procedures: No new testing needed  Call if you would like a CT calcium score for chest pain  Follow-Up: At [facility], you and your health needs are our priority.  As part of our continuing mission to provide you with exceptional heart care, we have created designated Provider Care Teams.  These Care Teams include your primary Cardiologist (physician) and Advanced Practice Providers (APPs -  Physician Assistants and Nurse Practitioners) who all work together to provide you with the care you need, when you need it.  You will need a follow up appointment as needed  Providers on your designated Care Team:   Lonni Meager, NP Bernardino Bring, PA-C Cadence [place], [place]  COVID-19 Vaccine Information can be found at: [email or web address] For questions related to vaccine distribution or appointments, please email [email or web address] or call [phone].   CT coronary calcium score.   - $99 out of pocket cost at the time of your test  [facility] [address] [place], [place] [address]   Coronary CalciumScan A coronary calcium scan is an imaging test used to look for deposits of calcium and other fatty materials (plaques) in the inner lining of the blood vessels of the heart (coronary arteries). These deposits of calcium and plaques can partly clog and narrow the coronary arteries without producing any symptoms or warning signs. This puts a person at risk for a heart attack. This test can detect these deposits before symptoms develop. Tell a health care provider about: Any allergies you have. All medicines you are taking, including vitamins, herbs, eye  drops, creams, and over-the-counter medicines. Any problems you or family members have had with anesthetic medicines. Any blood disorders you have. Any surgeries you have had. Any medical conditions you have. Whether you are pregnant or may be pregnant. What are the risks? Generally, this is a safe procedure. However, problems may occur, including: Harm to a pregnant woman and her unborn baby. This test involves the use of radiation. Radiation exposure can be dangerous to a pregnant woman and her unborn baby. If you are pregnant, you generally should not have this procedure done. Slight increase in the risk of cancer. This is because of the radiation involved in the test. What happens before the procedure? No preparation is needed for this procedure. What happens during the procedure? You will undress and remove any jewelry around your neck or chest. You will put on a hospital gown. Sticky electrodes will be placed on your chest. The electrodes will be connected to an electrocardiogram (ECG) machine to record a tracing of the electrical activity of your heart. A CT scanner will take pictures of your heart. During this time, you will be asked to lie still and hold your breath for 2-3 seconds while a picture of your heart is being taken. The procedure may vary among health care providers and hospitals. What happens after the procedure? You can get dressed. You can return to your normal activities. It is up to you to get the results of your test. Ask your health care provider, or the department that is doing the test, when your results will be ready. Summary A coronary calcium scan is an imaging test used to look for deposits of calcium and other fatty materials (plaques) in the  inner lining of the blood vessels of the heart (coronary arteries). Generally, this is a safe procedure. Tell your health care provider if you are pregnant or may be pregnant. No preparation is needed for this  procedure. A CT scanner will take pictures of your heart. You can return to your normal activities after the scan is done. This information is not intended to replace advice given to you by your health care provider. Make sure you discuss any questions you have with your health care provider. Document Released: 02/29/2008 Document Revised: 07/22/2016 Document Reviewed: 07/22/2016 Elsevier Interactive Patient Education  2017 [employer].

## 2024-06-01 ENCOUNTER — Encounter: Payer: Self-pay | Admitting: Family Medicine

## 2024-06-01 ENCOUNTER — Other Ambulatory Visit: Payer: Self-pay

## 2024-06-01 ENCOUNTER — Other Ambulatory Visit (HOSPITAL_COMMUNITY): Payer: Self-pay

## 2024-06-01 ENCOUNTER — Other Ambulatory Visit: Payer: Self-pay | Admitting: Family Medicine

## 2024-06-01 DIAGNOSIS — E039 Hypothyroidism, unspecified: Secondary | ICD-10-CM

## 2024-06-01 MED ORDER — LEVOTHYROXINE SODIUM 25 MCG PO TABS
25.0000 ug | ORAL_TABLET | Freq: Every day | ORAL | 0 refills | Status: DC
  Filled 2024-06-01 – 2024-06-03 (×2): qty 90, 90d supply, fill #0

## 2024-06-03 ENCOUNTER — Other Ambulatory Visit: Payer: Self-pay

## 2024-06-24 ENCOUNTER — Encounter: Payer: Self-pay | Admitting: Dermatology

## 2024-06-24 ENCOUNTER — Ambulatory Visit: Payer: 59 | Admitting: Dermatology

## 2024-06-24 ENCOUNTER — Other Ambulatory Visit: Payer: Self-pay

## 2024-06-24 DIAGNOSIS — L988 Other specified disorders of the skin and subcutaneous tissue: Secondary | ICD-10-CM

## 2024-06-24 DIAGNOSIS — Z79899 Other long term (current) drug therapy: Secondary | ICD-10-CM

## 2024-06-24 DIAGNOSIS — L700 Acne vulgaris: Secondary | ICD-10-CM

## 2024-06-24 DIAGNOSIS — L578 Other skin changes due to chronic exposure to nonionizing radiation: Secondary | ICD-10-CM

## 2024-06-24 MED ORDER — DAPSONE 7.5 % EX GEL
1.0000 | Freq: Every morning | CUTANEOUS | 11 refills | Status: AC
  Filled 2024-06-24: qty 60, 30d supply, fill #0

## 2024-06-24 MED ORDER — ADAPALENE 0.3 % EX GEL
CUTANEOUS | 11 refills | Status: AC
  Filled 2024-06-24: qty 45, 30d supply, fill #0

## 2024-06-24 MED ORDER — SPIRONOLACTONE 100 MG PO TABS
100.0000 mg | ORAL_TABLET | Freq: Every day | ORAL | 11 refills | Status: AC
  Filled 2024-06-24: qty 30, 30d supply, fill #0
  Filled 2024-08-16: qty 30, 30d supply, fill #1
  Filled 2024-09-18: qty 30, 30d supply, fill #2

## 2024-06-24 NOTE — Patient Instructions (Signed)

## 2024-06-24 NOTE — Progress Notes (Signed)
   Follow-Up Visit   Subjective  Jean Foster is a 39 y.o. female who presents for the following: Acne face 1 yr f/u, Spironolactone  100mg  1 po qd, Adapalene  0.3% gel not using, Dapsone  0.7% qhs, ance been doing good   The following portions of the chart were reviewed this encounter and updated as appropriate: medications, allergies, medical history  Review of Systems:  No other skin or systemic complaints except as noted in HPI or Assessment and Plan.  Objective  Well appearing patient in no apparent distress; mood and affect are within normal limits.    A focused examination was performed of the following areas: face  Relevant exam findings are noted in the Assessment and Plan.    Assessment & Plan   ACNE VULGARIS Face BP 121/70 Exam: inflamed pap R cheek   Chronic condition with duration or expected duration over one year. Currently well-controlled.   Treatment Plan: Cont Spironolactone  100mg  1 po qd Cont Dapsone  7.5% gel qd Restart Adapalene  0.3% gel qhs as tolerated  Spironolactone  can cause increased urination and cause blood pressure to decrease. Please watch for signs of lightheadedness and be cautious when changing position. It can sometimes cause breast tenderness or an irregular period in premenopausal women. It can also increase potassium. The increase in potassium usually is not a concern unless you are taking other medicines that also increase potassium, so please be sure your doctor knows all of the other medications you are taking. This medication should not be taken by pregnant women.  This medicine should also not be taken together with sulfa  drugs like Bactrim  (trimethoprim /sulfamethexazole).   Topical retinoid medications like tretinoin/Retin-A, adapalene /Differin , tazarotene/Fabior, and Epiduo/Epiduo Forte can cause dryness and irritation when first started. Only apply a pea-sized amount to the entire affected area. Avoid applying it around the eyes,  edges of mouth and creases at the nose. If you experience irritation, use a good moisturizer first and/or apply the medicine less often. If you are doing well with the medicine, you can increase how often you use it until you are applying every night. Be careful with sun protection while using this medication as it can make you sensitive to the sun. This medicine should not be used by pregnant women.    Long term medication management.  Patient is using long term (months to years) prescription medication  to control their dermatologic condition.  These medications require periodic monitoring to evaluate for efficacy and side effects and may require periodic laboratory monitoring.  FACIAL ELASTOSIS Exam: Rhytides lower eyelids  Treatment Plan: Recommend Vitamin C serums qd Recommend restarting Adapalene  0.3% gel qhs as tolerated  Recommend daily broad spectrum sunscreen SPF 30+ to sun-exposed areas, reapply every 2 hours as needed. Call for new or changing lesions.  Staying in the shade or wearing long sleeves, sun glasses (UVA+UVB protection) and wide brim hats (4-inch brim around the entire circumference of the hat) are also recommended for sun protection.      ACNE VULGARIS   Related Medications Adapalene  (DIFFERIN ) 0.3 % gel Apply a pea sized amount to the entire face before bedtime nightly for acne ACTINIC ELASTOSIS    Return in about 1 year (around 06/24/2025) for Acne f/u.  I, Grayce Saunas, RMA, am acting as scribe for Boneta Sharps, MD .   Documentation: I have reviewed the above documentation for accuracy and completeness, and I agree with the above.  Boneta Sharps, MD

## 2024-07-05 ENCOUNTER — Other Ambulatory Visit: Payer: Self-pay | Admitting: Family Medicine

## 2024-07-05 ENCOUNTER — Other Ambulatory Visit: Payer: Self-pay

## 2024-07-05 DIAGNOSIS — F410 Panic disorder [episodic paroxysmal anxiety] without agoraphobia: Secondary | ICD-10-CM

## 2024-07-05 DIAGNOSIS — F319 Bipolar disorder, unspecified: Secondary | ICD-10-CM

## 2024-07-05 MED ORDER — BUSPIRONE HCL 10 MG PO TABS
10.0000 mg | ORAL_TABLET | Freq: Two times a day (BID) | ORAL | 0 refills | Status: DC | PRN
  Filled 2024-07-05: qty 60, 30d supply, fill #0

## 2024-07-22 ENCOUNTER — Ambulatory Visit: Admitting: Family Medicine

## 2024-07-22 ENCOUNTER — Other Ambulatory Visit: Payer: Self-pay

## 2024-07-22 ENCOUNTER — Encounter: Payer: Self-pay | Admitting: Family Medicine

## 2024-07-22 VITALS — BP 110/68 | HR 85 | Resp 16 | Ht 69.0 in | Wt 148.7 lb

## 2024-07-22 DIAGNOSIS — E611 Iron deficiency: Secondary | ICD-10-CM

## 2024-07-22 DIAGNOSIS — E039 Hypothyroidism, unspecified: Secondary | ICD-10-CM | POA: Diagnosis not present

## 2024-07-22 DIAGNOSIS — L700 Acne vulgaris: Secondary | ICD-10-CM

## 2024-07-22 DIAGNOSIS — F319 Bipolar disorder, unspecified: Secondary | ICD-10-CM

## 2024-07-22 DIAGNOSIS — J45990 Exercise induced bronchospasm: Secondary | ICD-10-CM

## 2024-07-22 DIAGNOSIS — R0789 Other chest pain: Secondary | ICD-10-CM | POA: Diagnosis not present

## 2024-07-22 DIAGNOSIS — E785 Hyperlipidemia, unspecified: Secondary | ICD-10-CM | POA: Diagnosis not present

## 2024-07-22 DIAGNOSIS — E538 Deficiency of other specified B group vitamins: Secondary | ICD-10-CM | POA: Diagnosis not present

## 2024-07-22 DIAGNOSIS — F410 Panic disorder [episodic paroxysmal anxiety] without agoraphobia: Secondary | ICD-10-CM | POA: Diagnosis not present

## 2024-07-22 LAB — IRON,TIBC AND FERRITIN PANEL
%SAT: 41 % (ref 16–45)
Ferritin: 19 ng/mL (ref 16–154)
Iron: 168 ug/dL (ref 40–190)
TIBC: 414 ug/dL (ref 250–450)

## 2024-07-22 LAB — CBC WITH DIFFERENTIAL/PLATELET
Absolute Lymphocytes: 1987 {cells}/uL (ref 850–3900)
Absolute Monocytes: 834 {cells}/uL (ref 200–950)
Basophils Absolute: 77 {cells}/uL (ref 0–200)
Basophils Relative: 0.9 %
Eosinophils Absolute: 77 {cells}/uL (ref 15–500)
Eosinophils Relative: 0.9 %
HCT: 39.6 % (ref 35.0–45.0)
Hemoglobin: 12.9 g/dL (ref 11.7–15.5)
MCH: 28.4 pg (ref 27.0–33.0)
MCHC: 32.6 g/dL (ref 32.0–36.0)
MCV: 87.2 fL (ref 80.0–100.0)
MPV: 9.8 fL (ref 7.5–12.5)
Monocytes Relative: 9.7 %
Neutro Abs: 5624 {cells}/uL (ref 1500–7800)
Neutrophils Relative %: 65.4 %
Platelets: 355 Thousand/uL (ref 140–400)
RBC: 4.54 Million/uL (ref 3.80–5.10)
RDW: 11.9 % (ref 11.0–15.0)
Total Lymphocyte: 23.1 %
WBC: 8.6 Thousand/uL (ref 3.8–10.8)

## 2024-07-22 LAB — TSH: TSH: 3.44 m[IU]/L

## 2024-07-22 MED ORDER — ALBUTEROL SULFATE HFA 108 (90 BASE) MCG/ACT IN AERS
2.0000 | INHALATION_SPRAY | Freq: Four times a day (QID) | RESPIRATORY_TRACT | 0 refills | Status: AC | PRN
  Filled 2024-07-22: qty 6.7, 30d supply, fill #0

## 2024-07-22 MED ORDER — BUPROPION HCL ER (SR) 100 MG PO TB12
100.0000 mg | ORAL_TABLET | Freq: Every day | ORAL | 1 refills | Status: AC
  Filled 2024-07-22 – 2024-09-17 (×3): qty 90, 90d supply, fill #0

## 2024-07-22 MED ORDER — BUSPIRONE HCL 10 MG PO TABS
10.0000 mg | ORAL_TABLET | Freq: Two times a day (BID) | ORAL | 1 refills | Status: AC | PRN
  Filled 2024-07-22 – 2024-09-17 (×3): qty 100, 50d supply, fill #0

## 2024-07-22 NOTE — Progress Notes (Signed)
 Name: Jean Foster   MRN: [id number]    DOB: 08-Aug-1986   Date:07/22/2024       Progress Note  Subjective  Chief Complaint  Chief Complaint  Patient presents with   Medical Management of Chronic Issues   She has a history of iron  deficiency anemia, last hemoglobin was normal but ferritin was down to 12. She denies pica or fatigue.  Her B12 levels were mid-range at 415 in August, and she now takes a mineral supplement containing B vitamins. She denies fatigue  She has been on thyroid  medication since after her second child and before last visit she decided to only taking half dose of levothyroxine  and TSH went up, she is now taking 25 mg daily again, denies change in bowel movements , fatigue or dysphagia   She has a formal diagnosis of bipolar disorder with a history of more depressive episodes. Currently on Wellbutrin  for depression switched from 150 XL to 100 SR to take BID but only taking it once a day.  Buspar  once a day  and hydroxizine prn , which she feels helps with energy. She has experienced mania in the past, which was exacerbated by Abilify . No recent manic episodes are reported.   She uses spironolactone  for acne , which she reports is effective.   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   Patient Active Problem List   Diagnosis Date Noted   Abnormal EKG 05/23/2024   Iron  deficiency anemia due to chronic blood loss 05/22/2022   Exercise-induced asthma 11/15/2021   Bipolar disorder with depression (HCC) 04/10/2020   Acquired hypothyroidism 04/30/2019   Panic attack 04/13/2019   Cystic acne 01/01/2019    Past Surgical History:  Procedure Laterality Date   NO PAST SURGERIES     TOOTH EXTRACTION       Family History  Problem Relation Age of Onset   Hypertension Mother    COPD Father    Depression Sister    Depression Sister    Bipolar disorder Maternal Grandmother    Bladder Cancer Maternal Grandfather    Lung cancer Paternal Grandmother    Alcoholism Paternal Grandfather    Cirrhosis Paternal Grandfather    Breast cancer Neg Hx    Ovarian cancer Neg Hx    Colon cancer Neg Hx    Diabetes Neg Hx     Social History   Tobacco Use   Smoking status: Former    Current packs/day: 0.00    Average packs/day: 0.3 packs/day for 12.0 years (3.0 ttl pk-yrs)    Types: Cigarettes    Start date: 06/16/2005    Quit date: 06/16/2017    Years since quitting: 7.1   Smokeless tobacco: Never  Substance Use Topics   Alcohol use: Not Currently    Alcohol/week: 2.0 - 3.0 standard drinks of alcohol    Types: 2 - 3 Cans of beer per week    Comment: once a week     Current Outpatient Medications:    Adapalene  (DIFFERIN ) 0.3 % gel, Apply a pea sized amount to the entire face before bedtime nightly for acne, Disp: 45 g, Rfl: 11   albuterol  (VENTOLIN  HFA) 108 (90 Base) MCG/ACT inhaler, Inhale 2 puffs  into the lungs every 6 (six) hours as needed for wheezing or shortness of breath., Disp: 6.7 g, Rfl: 0   Dapsone  7.5 % GEL, Apply 1 Application topically every morning. Qam to face for acne, Disp: 60 g, Rfl: 11   hydrOXYzine  (VISTARIL ) 25 MG capsule, Take 1 capsule (25 mg total) by mouth 3 (three) times daily as needed., Disp: 30 capsule, Rfl: 0   Iron , Ferrous Sulfate , 325 (65 Fe) MG TABS, Take 1 tablet (325 mg) by mouth once daily., Disp: 100 tablet, Rfl: 0   levothyroxine  (SYNTHROID ) 25 MCG tablet, Take 1 tablet (25 mcg total) by mouth daily before breakfast., Disp: 90 tablet, Rfl: 0   Multiple Vitamin (MULTIVITAMIN) tablet, Take 1 tablet by mouth daily., Disp: , Rfl:    spironolactone  (ALDACTONE ) 100 MG tablet, Take 1 tablet (100 mg total) by mouth daily. 1 po qd for acne, Disp: 30 tablet, Rfl:  11   valACYclovir  (VALTREX ) 1000 MG tablet, Take 1 tablet (1,000 mg total) by mouth 2 (two) times daily., Disp: 10 tablet, Rfl: 0   vitamin B-12 (CYANOCOBALAMIN ) 500 MCG tablet, Take 500 mcg by mouth daily., Disp: , Rfl:    vitamin C (ASCORBIC ACID) 500 MG tablet, Take 500 mg by mouth daily., Disp: , Rfl:    buPROPion  ER (WELLBUTRIN  SR) 100 MG 12 hr tablet, Take 1 tablet (100 mg total) by mouth daily., Disp: 90 tablet, Rfl: 1   busPIRone  (BUSPAR ) 10 MG tablet, Take 1 tablet (10 mg total) by mouth 2 (two) times daily as needed., Disp: 100 tablet, Rfl: 1  No Known Allergies  I personally reviewed active problem list, medication list, allergies with the patient/caregiver today.   ROS  Ten systems reviewed and is negative except as mentioned in HPI    Objective Physical Exam   Vitals:   07/22/24 0805  BP: 110/68  Pulse: 85  Resp: 16  SpO2: 100%  Weight: 148 lb 11.2 oz (67.4 kg)  Height: 5' 9 (1.753 m)    Body mass index is 21.96 kg/m.  Recent Results (from the past 2160 hours)  Comprehensive metabolic panel with GFR     Status: Abnormal   Collection Time: 05/12/24  8:38 AM  Result Value Ref Range   Glucose, Bld 70 65 - 99 mg/dL    Comment: .            Fasting reference interval .    BUN 17 7 - 25 mg/dL   Creat 8.89 (H) 9.49 - 0.97 mg/dL   eGFR 66 > OR = 60 fO/fpw/8.26f7   BUN/Creatinine Ratio 15 6 - 22 (calc)   Sodium 137 135 - 146 mmol/L   Potassium 4.5 3.5 - 5.3 mmol/L   Chloride 101 98 - 110 mmol/L   CO2 29 20 - 32 mmol/L   Calcium 9.7 8.6 - 10.2 mg/dL   Total Protein 7.1 6.1 - 8.1 g/dL   Albumin 4.4 3.6 - 5.1 g/dL   Globulin 2.7 1.9 - 3.7 g/dL (calc)   AG Ratio 1.6 1.0 - 2.5 (calc)   Total Bilirubin 0.3 0.2 - 1.2 mg/dL   Alkaline phosphatase (APISO) 34 31 - 125 U/L   AST 16 10 - 30 U/L   ALT 11 6 - 29 U/L  CBC with Differential/Platelet     Status: None   Collection Time: 05/12/24  8:38 AM  Result Value Ref Range   WBC 6.4 3.8 - 10.8 Thousand/uL   RBC  4.21 3.80 - 5.10 Million/uL  Hemoglobin 12.2 11.7 - 15.5 g/dL   HCT 62.2 64.9 - 54.9 %   MCV 89.5 80.0 - 100.0 fL   MCH 29.0 27.0 - 33.0 pg   MCHC 32.4 32.0 - 36.0 g/dL    Comment: For adults, a slight decrease in the calculated MCHC value (in the range of 30 to 32 g/dL) is most likely not clinically significant; however, it should be interpreted with caution in correlation with other red cell parameters and the patient's clinical condition.    RDW 14.4 11.0 - 15.0 %   Platelets 340 140 - 400 Thousand/uL   MPV 9.9 7.5 - 12.5 fL   Neutro Abs 3,859 1,500 - 7,800 cells/uL   Absolute Lymphocytes 1,773 850 - 3,900 cells/uL   Absolute Monocytes 634 200 - 950 cells/uL   Eosinophils Absolute 51 15 - 500 cells/uL   Basophils Absolute 83 0 - 200 cells/uL   Neutrophils Relative % 60.3 %   Total Lymphocyte 27.7 %   Monocytes Relative 9.9 %   Eosinophils Relative 0.8 %   Basophils Relative 1.3 %  Lipid panel     Status: Abnormal   Collection Time: 05/12/24  8:38 AM  Result Value Ref Range   Cholesterol 199 <200 mg/dL   HDL 81 > OR = 50 mg/dL   Triglycerides 62 <849 mg/dL   LDL Cholesterol (Calc) 104 (H) mg/dL (calc)    Comment: Reference range: <100 . Desirable range <100 mg/dL for primary prevention;   <70 mg/dL for patients with CHD or diabetic patients  with > or = 2 CHD risk factors. SABRA LDL-C is now calculated using the Rempel-Hopkins  calculation, which is a validated novel method providing  better accuracy than the Friedewald equation in the  estimation of LDL-C.  Gladis APPLETHWAITE et al. SANDREA. 7986;689(80): 2061-2068  (http://education.[facility].com/faq/FAQ164)    Total CHOL/HDL Ratio 2.5 <5.0 (calc)   Non-HDL Cholesterol (Calc) 118 <130 mg/dL (calc)    Comment: For patients with diabetes plus 1 major ASCVD risk  factor, treating to a non-HDL-C goal of <100 mg/dL  (LDL-C of <29 mg/dL) is considered a therapeutic  option.   TSH     Status: Abnormal   Collection Time:  05/12/24  8:38 AM  Result Value Ref Range   TSH 4.76 (H) mIU/L    Comment:           Reference Range .           > or = 20 Years  0.40-4.50 .                Pregnancy Ranges           First trimester    0.26-2.66           Second trimester   0.55-2.73           Third trimester    0.43-2.91   B12 and Folate Panel     Status: None   Collection Time: 05/12/24  8:38 AM  Result Value Ref Range   Vitamin B-12 590 200 - 1,100 pg/mL   Folate 19.7 ng/mL    Comment:                            Reference Range                            Low:           <  3.4                            Borderline:    3.4-5.4                            Normal:        >5.4 .   Iron , TIBC and Ferritin Panel     Status: Abnormal   Collection Time: 05/12/24  8:38 AM  Result Value Ref Range   Iron  29 (L) 40 - 190 mcg/dL   TIBC 633 749 - 549 mcg/dL (calc)   %SAT 8 (L) 16 - 45 % (calc)   Ferritin 12 (L) 16 - 154 ng/mL  Basic metabolic panel     Status: Abnormal   Collection Time: 05/12/24  9:39 PM  Result Value Ref Range   Sodium 137 135 - 145 mmol/L   Potassium 4.1 3.5 - 5.1 mmol/L   Chloride 102 98 - 111 mmol/L   CO2 25 22 - 32 mmol/L   Glucose, Bld 104 (H) 70 - 99 mg/dL    Comment: Glucose reference range applies only to samples taken after fasting for at least 8 hours.   BUN 21 (H) 6 - 20 mg/dL   Creatinine, Ser 8.89 (H) 0.44 - 1.00 mg/dL   Calcium 9.9 8.9 - 89.6 mg/dL   GFR, Estimated >39 >39 mL/min    Comment: (NOTE) Calculated using the CKD-EPI Creatinine Equation (2021)    Anion gap 10 5 - 15    Comment: Performed at [facility], [address] [place], [place] [address]  CBC     Status: None   Collection Time: 05/12/24  9:39 PM  Result Value Ref Range   WBC 9.4 4.0 - 10.5 K/uL   RBC 4.24 3.87 - 5.11 MIL/uL   Hemoglobin 12.1 12.0 - 15.0 g/dL   HCT 62.7 63.9 - 53.9 %   MCV 87.7 80.0 - 100.0 fL   MCH 28.5 26.0 - 34.0 pg   MCHC 32.5 30.0 - 36.0 g/dL   RDW 85.3 88.4 - 84.4 %    Platelets 337 150 - 400 K/uL   nRBC 0.0 0.0 - 0.2 %    Comment: Performed at [facility], [address] [place], [place] [address]  Troponin I (High Sensitivity)     Status: None   Collection Time: 05/12/24  9:39 PM  Result Value Ref Range   Troponin I (High Sensitivity) 4 <18 ng/L    Comment: (NOTE) Elevated high sensitivity troponin I (hsTnI) values and significant  changes across serial measurements may suggest ACS but many other  chronic and acute conditions are known to elevate hsTnI results.  Refer to the Links section for chest pain algorithms and additional  guidance. Performed at [facility], [address] [place], [place] [address]   Troponin I (High Sensitivity)     Status: None   Collection Time: 05/12/24 11:27 PM  Result Value Ref Range   Troponin I (High Sensitivity) 3 <18 ng/L    Comment: (NOTE) Elevated high sensitivity troponin I (hsTnI) values and significant  changes across serial measurements may suggest ACS but many other  chronic and acute conditions are known to elevate hsTnI results.  Refer to the Links section for chest pain algorithms and additional  guidance. Performed at [facility], [address], [place], [place] [address]   hCG, quantitative,  pregnancy     Status: None   Collection Time: 05/12/24 11:27 PM  Result Value Ref Range   hCG, Beta Chain, Quant, S 1 <5 mIU/mL    Comment:          GEST. AGE      CONC.  (mIU/mL)   <=1 WEEK        5 - 50     2 WEEKS       50 - 500     3 WEEKS       100 - 10,000     4 WEEKS     1,000 - 30,000     5 WEEKS     3,500 - 115,000   6-8 WEEKS     12,000 - 270,000    12 WEEKS     15,000 - 220,000        FEMALE AND NON-PREGNANT FEMALE:     LESS THAN 5 mIU/mL Performed at [facility], [address] [place], [place] [address]     PHQ2/9:    07/22/2024    8:03 AM 05/12/2024    8:04 AM 01/20/2024    8:25 AM 07/11/2023    8:50 AM 05/07/2023   11:13 AM   Depression screen PHQ 2/9  Decreased Interest 0 0 0 0 0  Down, Depressed, Hopeless 0 0 0 0 0  PHQ - 2 Score 0 0 0 0 0  Altered sleeping  0  0 0  Tired, decreased energy  0  0 0  Change in appetite  0  0 0  Feeling bad or failure about yourself   0  0 0  Trouble concentrating  0  0 0  Moving slowly or fidgety/restless  0  0 0  Suicidal thoughts  0  0 0  PHQ-9 Score  0  0 0  Difficult doing work/chores  Not difficult at all  Not difficult at all Not difficult at all    phq 9 is negative  Fall Risk:    07/22/2024    8:03 AM 05/12/2024    8:04 AM 01/20/2024    8:20 AM 07/11/2023    8:50 AM 05/07/2023   11:12 AM  Fall Risk   Falls in the past year? 0 0 0 0 0  Number falls in past yr: 0 0 0 0 0  Injury with Fall? 0 0 0 0 0  Risk for fall due to : No Fall Risks No Fall Risks No Fall Risks No Fall Risks   Follow up Falls evaluation completed Falls evaluation completed Falls prevention discussed;Education provided;Falls evaluation completed Falls prevention discussed;Education provided;Falls evaluation completed       Assessment & Plan  1. Bipolar disorder with depression (HCC) (Primary)  - busPIRone  (BUSPAR ) 10 MG tablet; Take 1 tablet (10 mg total) by mouth 2 (two) times daily as needed.  Dispense: 100 tablet; Refill: 1  She has been taking it only in am , states seems to be working well that way She is not taking a mood stabilizer since Abilify  caused mania. Explain that we can try Vraylar when she is on vacation if she would like   2. Atypical chest pain  Seen at [facility] and followed up with cardiologist  No symptoms, per cardiologist likely muscular skeletal  3. Acquired hypothyroidism  - TSH  4. Iron  deficiency  We will recheck levels, she takes ferrous sulfate  occasionally Denies pica - CBC with Differential/Platelet - Iron , TIBC and Ferritin Panel  5. B12 deficiency  Reviewed labs, continue b12 complex vitamin  6. Dyslipidemia  Continue life style  modifications  7. Acne vulgaris  Doing well on Spironolactone    8. Exercise-induced asthma  Doing well but would like to have  9. Panic attack  - busPIRone  (BUSPAR ) 10 MG tablet; Take 1 tablet (10 mg total) by mouth 2 (two) times daily as needed.  Dispense: 100 tablet; Refill: 1

## 2024-07-23 ENCOUNTER — Ambulatory Visit: Payer: Self-pay | Admitting: Family Medicine

## 2024-07-23 ENCOUNTER — Other Ambulatory Visit: Payer: Self-pay | Admitting: Family Medicine

## 2024-07-23 ENCOUNTER — Other Ambulatory Visit: Payer: Self-pay

## 2024-07-23 MED ORDER — LEVOTHYROXINE SODIUM 25 MCG PO TABS
25.0000 ug | ORAL_TABLET | Freq: Every day | ORAL | 0 refills | Status: AC
  Filled 2024-07-23 – 2024-08-26 (×2): qty 90, 90d supply, fill #0

## 2024-08-26 ENCOUNTER — Other Ambulatory Visit: Payer: Self-pay

## 2024-09-17 ENCOUNTER — Other Ambulatory Visit: Payer: Self-pay

## 2024-09-17 ENCOUNTER — Other Ambulatory Visit (HOSPITAL_COMMUNITY): Payer: Self-pay

## 2025-05-16 ENCOUNTER — Encounter: Admitting: Family Medicine

## 2025-06-27 ENCOUNTER — Ambulatory Visit: Admitting: Dermatology
